# Patient Record
Sex: Female | Born: 1945 | Race: White | Hispanic: No | Smoking: Former smoker
Health system: Southern US, Community
[De-identification: ages and names within clinical notes are randomized; demographics above are authoritative.]

## PROBLEM LIST (undated history)

## (undated) DIAGNOSIS — IMO0001 Reserved for inherently not codable concepts without codable children: Secondary | ICD-10-CM

## (undated) DIAGNOSIS — IMO0002 Reserved for concepts with insufficient information to code with codable children: Secondary | ICD-10-CM

## (undated) DIAGNOSIS — E669 Obesity, unspecified: Secondary | ICD-10-CM

## (undated) DIAGNOSIS — C7931 Secondary malignant neoplasm of brain: Secondary | ICD-10-CM

## (undated) DIAGNOSIS — F329 Major depressive disorder, single episode, unspecified: Secondary | ICD-10-CM

## (undated) DIAGNOSIS — I10 Essential (primary) hypertension: Secondary | ICD-10-CM

## (undated) DIAGNOSIS — F32A Depression, unspecified: Secondary | ICD-10-CM

## (undated) DIAGNOSIS — J449 Chronic obstructive pulmonary disease, unspecified: Secondary | ICD-10-CM

## (undated) DIAGNOSIS — E785 Hyperlipidemia, unspecified: Secondary | ICD-10-CM

## (undated) HISTORY — PX: REPLACEMENT TOTAL KNEE: SUR1224

---

## 1999-11-19 ENCOUNTER — Ambulatory Visit (HOSPITAL_COMMUNITY): Admission: RE | Admit: 1999-11-19 | Discharge: 1999-11-19 | Payer: Self-pay | Admitting: Gastroenterology

## 2000-05-08 ENCOUNTER — Encounter: Payer: Self-pay | Admitting: Family Medicine

## 2000-05-08 ENCOUNTER — Encounter: Admission: RE | Admit: 2000-05-08 | Discharge: 2000-05-08 | Payer: Self-pay | Admitting: Family Medicine

## 2000-07-13 ENCOUNTER — Encounter: Admission: RE | Admit: 2000-07-13 | Discharge: 2000-07-21 | Payer: Self-pay | Admitting: Orthopedic Surgery

## 2001-04-21 ENCOUNTER — Encounter: Payer: Self-pay | Admitting: Emergency Medicine

## 2001-04-21 ENCOUNTER — Emergency Department (HOSPITAL_COMMUNITY): Admission: EM | Admit: 2001-04-21 | Discharge: 2001-04-21 | Payer: Self-pay | Admitting: Emergency Medicine

## 2001-06-18 ENCOUNTER — Encounter: Admission: RE | Admit: 2001-06-18 | Discharge: 2001-07-05 | Payer: Self-pay | Admitting: Orthopedic Surgery

## 2005-07-04 ENCOUNTER — Inpatient Hospital Stay (HOSPITAL_COMMUNITY): Admission: RE | Admit: 2005-07-04 | Discharge: 2005-07-07 | Payer: Self-pay | Admitting: Orthopedic Surgery

## 2005-07-04 ENCOUNTER — Ambulatory Visit: Payer: Self-pay | Admitting: Physical Medicine & Rehabilitation

## 2005-07-07 ENCOUNTER — Encounter (HOSPITAL_COMMUNITY)
Admission: RE | Admit: 2005-07-07 | Discharge: 2005-07-08 | Payer: Self-pay | Admitting: Physical Medicine & Rehabilitation

## 2005-07-07 ENCOUNTER — Inpatient Hospital Stay
Admission: RE | Admit: 2005-07-07 | Discharge: 2005-07-12 | Payer: Self-pay | Admitting: Physical Medicine & Rehabilitation

## 2007-02-21 ENCOUNTER — Ambulatory Visit (HOSPITAL_COMMUNITY): Admission: RE | Admit: 2007-02-21 | Discharge: 2007-02-21 | Payer: Self-pay | Admitting: Gastroenterology

## 2007-02-21 ENCOUNTER — Encounter (INDEPENDENT_AMBULATORY_CARE_PROVIDER_SITE_OTHER): Payer: Self-pay | Admitting: Gastroenterology

## 2007-04-20 ENCOUNTER — Ambulatory Visit (HOSPITAL_BASED_OUTPATIENT_CLINIC_OR_DEPARTMENT_OTHER): Admission: RE | Admit: 2007-04-20 | Discharge: 2007-04-20 | Payer: Self-pay | Admitting: Family

## 2007-04-29 ENCOUNTER — Ambulatory Visit: Payer: Self-pay | Admitting: Internal Medicine

## 2007-06-15 ENCOUNTER — Ambulatory Visit (HOSPITAL_BASED_OUTPATIENT_CLINIC_OR_DEPARTMENT_OTHER): Admission: RE | Admit: 2007-06-15 | Discharge: 2007-06-15 | Payer: Self-pay | Admitting: Family Medicine

## 2007-06-21 ENCOUNTER — Ambulatory Visit: Payer: Self-pay | Admitting: Internal Medicine

## 2011-01-25 NOTE — Procedures (Signed)
NAMEJASSICA, Caitlin Carter              ACCOUNT NO.:  [id number]   MEDICAL RECORD NO.:  [id number]          PATIENT TYPE:  OUT   LOCATION:  SLEEP CENTER                 FACILITY:  [facility]   PHYSICIAN:  Clinton D. Maple Hudson, MD, FCCP, FACPDATE OF BIRTH:  06-12-1946   DATE OF STUDY:  04/20/2007                            NOCTURNAL POLYSOMNOGRAM   REFERRING PHYSICIAN:  Rema Fendt, M.D.   REFERRING PHYSICIAN:  Rema Fendt, M.D.   INDICATION FOR STUDY:  Hypersomnia with sleep apnea.   EPWORTH SLEEPINESS SCORE:  5/24.   BMI 43. Weight 260 pounds.   MEDICATIONS:  Home medications are listed and reviewed.   SLEEP ARCHITECTURE:  Total sleep time 201 minutes with sleep efficiency  54%. Stage I was 33%. Stage II 64%. Stage III absent. REM 3% of total  sleep time. Sleep latency 51 minutes. REM latency 110 minutes. Awake  after sleep onset 115 minutes. Arousal index 29.6. Temazepam and  alprazolam were taken at bedtime.   RESPIRATORY DATA:  Apnea/hypopnea index (AHI, RDI) 32.5 obstructive  events per hour indicating moderate obstructive sleep apnea/hypopnea  syndrome. There were 4 obstructive apneas and 105 hypopneas. Events were  not positional. REM AHI 76.4. There were not enough early events to  permit CPAP titration by sleep protocol on this study night.   OXYGEN DATA:  Very loud snoring with oxygen desaturation to a nadir of  74%. Mean oxygen saturation was 88% through the study night on room air,  indicating underlying cardiopulmonary disease.   CARDIAC DATA:  Sinus rhythm.   MOVEMENT-PARASOMNIA:  Occasional limb jerk with little effect on sleep.   IMPRESSIONS-RECOMMENDATIONS:  1. Short total sleep time with reduced REM. She had taken both      temazepam and alprazolam at bedtime.  2. Moderate obstructive sleep apnea/hypopnea syndrome, apnea/hypopnea      index 32.5 per hour. These were mainly hypopneas, nonpositional.      Snoring was very loud.  3. Events developed too late in  the night to permit initiation of CPAP      titration by split-night protocol on this study night. Consider      return for CPAP titration or evaluate for alternative therapy as      appropriate.  4. There was sustained hypoxemia with mean oxygen saturations through      the night of 88% which did not quite      trigger initiation of supplemental oxygen by protocol. If no other      therapy is chosen, consider supplemental oxygen or sleep. This      likely reflects underlying cardiopulmonary disease.      Clinton D. Maple Hudson, MD, [facility], FACP  Diplomate, Biomedical engineer of Sleep Medicine  Electronically Signed     CDY/MEDQ  D:  04/29/2007 11:36:41  T:  04/30/2007 08:32:34  Job:  [id number]

## 2011-01-25 NOTE — Op Note (Signed)
Caitlin Carter, DENZLER              ACCOUNT NO.:  [id number]   MEDICAL RECORD NO.:  [id number]          PATIENT TYPE:  AMB   LOCATION:  ENDO                         FACILITY:  [facility]   PHYSICIAN:  Anselmo Rod, M.D.  DATE OF BIRTH:  14-Jun-1946   DATE OF PROCEDURE:  02/21/2007  DATE OF DISCHARGE:                               OPERATIVE REPORT   PROCEDURE PERFORMED:  Colonoscopy with snare polypectomy x1 and cold  biopsies x6.   ENDOSCOPIST:  Anselmo Rod, M.D.   INSTRUMENT USED:  Pentax video colonoscope.   INDICATIONS FOR PROCEDURE:  65 year old white female undergoing  screening colonoscopy.  The patient has a history of rectal bleeding and  a long standing history of constipation, rule out colonic polyps,  masses, etc.   PREPROCEDURE PREPARATION:  Informed consent was procured from the  patient. The patient fasted for 8 hours prior to the procedure and  prepped with a gallon of NuLytely the night prior to the procedure. The  risks and benefits of the procedure including a 10% miss rate of cancer  and polyp were discussed with the patient, as well.   PREPROCEDURE PHYSICAL:  The patient had stable vital signs.  Neck was  supple.  Chest clear to auscultation.  S1 and S2 regular.  Abdomen soft  with normal bowel sounds.   DESCRIPTION OF PROCEDURE:  The patient was placed in the left lateral  decubitus position and sedated with 100 mcg of Fentanyl and 7.5 mg of  Versed given intravenously in slow incremental doses.  Once the patient  was adequately sedated and maintained on low flow oxygen and continuous  cardiac monitoring, the Pentax video colonoscope was advanced from the  rectum to the cecum.  Small internal hemorrhoids were seen on  retroflexion.  Sigmoid diverticula were noted.  There was a large amount  of residual stool in the colon especially in the diverticula.  A small  flat polyp was removed by cold snare from 30 cm, three small sessile  polyps were biopsied  between 20-30 cm.  Patchy erythema over the  ileocecal valve was biopsied, as well.  The terminal ileum was not  visualized in spite of efforts to do so.  The patient seemed to drop her  oxygen saturation on several occasions with sedation which indicates  that she may be actually suffering from sleep apnea. A small external  hemorrhoid was seen on withdrawal of the scope. The patient tolerated  the procedure well without immediate complications.   IMPRESSION:  1. Small external hemorrhoid.  2. Small internal hemorrhoids.  3. Sigmoid diverticulosis with a large amount of stool in the      diverticula.  4. Poor prep, large amount of residual stool in the colon.  5. Flat polyp removed by cold snare from 30 cm and three small sessile      polyps biopsied from 20-30 cm.  6. Patchy erythema or IC valve, biopsies done x 4.  7. Terminal ileum not visualized.  8. Question sleep apnea.  The patient had drop in oxygen saturation      during the procedure.  RECOMMENDATIONS:  1. A message has been left for Rema Fendt, P.A., at [facility] to make sure she is aware of this observation and      that the patient can be evaluated for sleep apnea in the near      future.  The patient and her family has been informed about this,      as well.  2. Await pathology results.  3. Avoid NSAIDS for the next 2 weeks.  4. OP follow up in 2 weeks or earlier if needed.  5. High fiber diet. Brochures on diverticulosis have been given to the      patient.      Anselmo Rod, M.D.  Electronically Signed     JNM/MEDQ  D:  02/21/2007  T:  02/21/2007  Job:  [id number]   cc:   Marjory Lies, M.D.  Fax: [phone]

## 2011-01-25 NOTE — Procedures (Signed)
NAMECLOYCE, Caitlin Carter              ACCOUNT NO.:  [id number]   MEDICAL RECORD NO.:  [id number]          PATIENT TYPE:  OUT   LOCATION:  SLEEP CENTER                 [facility]ACILITY:  [facility]   PHYSICIAN:  Clinton D. Maple Hudson, MD, FCCP, FACPDATE OF BIRTH:  1945/09[birth year]   DATE OF STUDY:  06/15/2007                            NOCTURNAL POLYSOMNOGRAM   REFERRING PHYSICIAN:  Evelena Peat, M.D.   INDICATION FOR STUDY:  Hypersomnia with sleep apnea.   EPWORTH SLEEPINESS SCORE:  5/24.  BMI 43.2, weight 260 pounds.   MEDICATIONS:  Home medications are listed and reviewed.   SLEEP ARCHITECTURE:  Total sleep time 349 minutes with sleep efficiency  94%.  Stage I is 5%, stage II 59%, stage III absent, REM 35% of total  sleep time. Sleep latency 3 minutes, REM latency 73 minutes, awake after  sleep onset 19 minutes, arousal index 5.8.  Temazepam 15 mg and  alprazolam were both listed by the technician.  His medications the  patient sometimes takes, not clear whether they were taken for this  study.   RESPIRATORY DATA:  CPAP titration protocol. CPAP was titrated to 13 CWP,  AHI 0 per hour.  A medium Mirage Quattro mask was used with heated  humidifier.   OXYGEN DATA:  Snoring was prevented by CPAP and oxygen saturation held  at 90-93% on room air with CPAP control.   CARDIAC DATA:  Normal sinus rhythm.   MOVEMENT-PARASOMNIA:  No movement disturbance.  Bathroom 1.   IMPRESSIONS-RECOMMENDATIONS:  1. Successful CPAP titration to 12 CWP, AHI 0 per hour.  A medium      Mirage Quattro mask was used with heated humidifier.  2. Baseline diagnostic NPSG on April 20, 2007 had recorded an AHI of      32.5 per hour.      Clinton D. Maple Hudson, MD, [facility], FACP  Diplomate, Biomedical engineer of Sleep Medicine  Electronically Signed    CDY/MEDQ  D:  06/23/2007 14:02:37  T:  06/23/2007 21:30:02  Job:  [id number]

## 2011-01-28 NOTE — Discharge Summary (Signed)
NAMEJOJO, GEVING              ACCOUNT NO.:  [id number]   MEDICAL RECORD NO.:  [id number]          PATIENT TYPE:  ORB   LOCATION:  4504                         FACILITY:  [facility]   PHYSICIAN:  Ranelle Oyster, M.D.DATE OF BIRTH:  September 19, 1945   DATE OF ADMISSION:  07/07/2005  DATE OF DISCHARGE:  07/12/2005                                 DISCHARGE SUMMARY   DISCHARGE DIAGNOSES:  1.  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  2.  Pain management.  3.  Coumadin for deep vein thrombosis prophylaxis.  4.  Hypertension.  5.  Streptococcus B urinary tract infection.  6.  Insomnia.  7.  Tobacco abuse.  8.  Hyperlipidemia.   HISTORY OF PRESENT ILLNESS:  This is a 65 year old female admitted July 04, 2005 with end-stage changes of the right knee and no relief with  conservative care.  Underwent a right total knee replacement July 04, 2005 per Dr. Thurston Hole.  Placed on Coumadin for deep vein thrombosis  prophylaxis.  Weightbearing as tolerated.  PCA morphine discontinued July 05, 2005.  Placed on OxyContin sustained release.  Foley catheter tube  removed July 05, 2005.  Placed on Augmentin July 05, 2005 for  preoperative urinary tract infection of Streptococcus B.  She was admitted  to subacute care services.   PAST MEDICAL HISTORY:  See discharge diagnoses.  Noted positive tobacco, no  alcohol.   ALLERGIES:  NO BLOOD PRODUCTS.   SOCIAL HISTORY:  Lives alone.  She is a Scientist, product/process development.  Works for the  [employer] of [employer], site.  Local family works.   MEDICATIONS PRIOR TO ADMISSION:  1.  Diovan/HCT 160/25 daily.  2.  Restoril 15 mg at bedtime.  3.  Xanax 0.5 mg at bedtime.  4.  Pravachol 80 mg daily.   HOSPITAL COURSE:  Patient with progressive gains while on rehab services,  with therapies initiated daily.  The following issues were followed during  the patient's rehab course.  Pertaining to Mrs. Eicher's  right total knee  replacement, surgical site healing nicely.  She did have some mild redness.  She had been placed on Keflex for wound coverage.  Initially, the patient  had been on Augmentin for a preoperative urinary tract infection of  Streptococcus B.  Pain management ongoing with the use of OxyContin  sustained release that was slowly tapered as well as oxycodone for  breakthrough pain.  Coumadin for deep vein thrombosis prophylaxis, with  latest INR of 1.6.  She remained on subcutaneous Lovenox until INR greater  than 2.  Blood pressures monitored with the use of Avapro and  hydrochlorothiazide.  She remained on her Restoril for history of insomnia  as well as Xanax for mild anxiety.  Pravachol for hyperlipidemia, which was  without issue during her rehab stay.  She had no bowel or bladder  disturbances.  She did spike a low-grade temperature.  Urinalysis study was  insignificant growth.  Chest was clear to auscultation.  She denied any  chest pain or shortness of breath.  Her Keflex had been added for wound  coverage.  Fevers had later resolved.   Overall, for her functional mobility, she was ambulating household  functional distances with a walker, essentially independent to standby  assist in all areas of activities of daily living, except minimal assist for  lower body dressing.  Home health therapies had been arranged.   Latest labs showed an INR of 1.6, hemoglobin 10, hematocrit 28.9.  Sodium  137, potassium 3.9, BUN 10, creatinine 1.   DISCHARGE MEDICATIONS AT TIME OF DICTATION:  1.  Coumadin, with latest dose of 6 mg, to be discontinued August 04, 2005.  2.  Zanaflex 2 mg at bedtime as needed.  3.  Pravachol 80 mg daily.  4.  OxyContin sustained release 10 mg every 12 hours x1 week, then      discontinued.  5.  Diovan with hydrochlorothiazide 160/25 mg daily.  6.  Xanax 0.5 mg at bedtime.  7.  Restoril 15 mg at bedtime.  8.  Keflex 500 mg q.i.d. x3 days, then  discontinue.  9.  Oxycodone as needed for breakthrough pain.   She would return to Dr. Thurston Hole for orthopedic services, call for  appointment.  She was weightbearing as tolerated.  A home health nurse had  been arranged per [facility] to check next INR on Wednesday July 13, 2005.      Mariam Dollar, P.A.      Ranelle Oyster, M.D.  Electronically Signed    DA/MEDQ  D:  07/11/2005  T:  07/11/2005  Job:  [id number]   cc:   Ranelle Oyster, M.D.  Fax: [phone]   Elana Alm. Thurston Hole, M.D.  Fax: [phone]   Rema Fendt, M.D.  [facility]  [place]

## 2011-01-28 NOTE — Discharge Summary (Signed)
Caitlin Carter, Caitlin Carter              ACCOUNT NO.:  [id number]   MEDICAL RECORD NO.:  [id number]          PATIENT TYPE:  INP   LOCATION:  5023                         FACILITY:  [facility]   PHYSICIAN:  Molly Maduro A. Thurston Hole, M.D. DATE OF BIRTH:  [birth year]   DATE OF ADMISSION:  07/04/2005  DATE OF DISCHARGE:  07/07/2005                                 DISCHARGE SUMMARY   ADMITTING DIAGNOSES:  1.  End stage degenerative joint disease, right knee.  2.  Hypertension.   DISCHARGE DIAGNOSES:  1.  End stage degenerative joint disease, right knee.  2.  Hypertension.  3.  Urinary tract infection.  4.  Tobacco disorder.   HISTORY OF PRESENT ILLNESS:  The patient is a 65 year old white female with  a history of end stage DJD of both knees, her right is more painful than her  left. She has tried conservative care including intra-articular cortisone  injections and anti-inflammatories without success. She understands the  risks, benefits, and possible complications of a right total knee  replacement and is without question.   PROCEDURE IN HOUSE:  On July 04, 2005 the patient underwent a right total  knee replacement by Dr. Thurston Hole and a right femoral nerve block anesthesia.  She tolerated both procedures well and was admitted postoperatively for pain  control, DVT prophylaxis, and physical therapy. Postop day 1 the patient was  doing well, wanted to get out of bed. Her hemoglobin was 12.2, her INR was  1.1. She had a urine culture that was positive for greater than 100,000  colonies of group B strep. She is on Ancef IV for this. She was started on  Percocet. Her PCA was discontinued. Postop day 2 the patient was doing well.  Difficulty with pain control. T-max of 100.5, T now at 99.2. INR 2.3. Her  Foley was discontinued. She was started on OxyContin 10 mg b.i.d. Rehab  consult was ordered due to the fact that the patient lived alone. Postop day  3 the patient was doing much better for pain control.  Her hemoglobin was 10,  her INR was 1.5, white cell count was 9.4. Surgical wound was well  approximated. She was discharged to subacute in stable condition,  weightbearing as tolerated, on a regular diet, on Percocet 1 to 2 q.4-6h.  p.r.n. pain, OxyContin CR 10 mg 1 p.o. b.i.d., Colace 100 mg 1 p.o. b.i.d.,  Senokot-S 2 before dinner, Diovan  HCT 160/25 daily, doxycycline 100 mg  b.i.d., temazepam 15 mg at bedtime, Xanax 0.5 mg at bedtime, Zanaflex 2 mg  at bedtime, Pravachol 80 mg daily. We will continue to follow her on  subacute.     Kirstin Shepperson, P.A.      Robert A. Thurston Hole, M.D.  Electronically Signed   KS/MEDQ  D:  09/18/2005  T:  09/19/2005  Job:  [id number]

## 2011-01-28 NOTE — Op Note (Signed)
Caitlin Carter, Caitlin Carter              ACCOUNT NO.:  [id number]   MEDICAL RECORD NO.:  [id number]          PATIENT TYPE:  INP   LOCATION:  2899                         FACILITY:  [facility]   PHYSICIAN:  Elana Alm. Thurston Hole, M.D. DATE OF BIRTH:  [birth year]   DATE OF PROCEDURE:  07/04/2005  DATE OF DISCHARGE:                                 OPERATIVE REPORT   PREOPERATIVE DIAGNOSIS:  Right knee degenerative joint disease.   POSTOPERATIVE DIAGNOSIS:  Right knee degenerative joint disease.   PROCEDURE:  Right total knee replacement using DePuy cemented total knee  system with #4 cemented femur, #4 cemented tibia with 12.5-mm polyethylene  RP tibial spacer, and 32-mm polyethylene cemented patella.   SURGEON:  Elana Alm. Thurston Hole, M.D.   ASSISTANT:  Julien Girt, P.A.   ANESTHESIA:  General.   OPERATIVE TIME:  One hour 40 minutes.   COMPLICATIONS:  None.   DESCRIPTION OF PROCEDURE:  Caitlin Carter was brought to operating room on  July 04, 2005 after a femoral nerve block had been placed in the holding  room by Anesthesia.  She was placed on the operative table in a supine  position.  She received Ancef 1 gram IV preoperatively for prophylaxis.  After being placed under general anesthesia, she had a Foley catheter placed  under sterile conditions.  Her right knee was examined, range of motion from  -5 to 125 degrees with mild varus deformity, knee stable to ligamentous  exam.  Her right leg was then prepped using sterile DuraPrep and draped  using sterile technique.  The leg was exsanguinated and a thigh tourniquet  elevated to .  Initially, through a 15-cm longitudinal incision based  over the patella, initial exposure was made.  The underlying subcutaneous  tissues were incised in line with the skin incision.  A median arthrotomy  was performed revealing an excessive amount of normal-appearing joint fluid.  The articular surfaces were inspected.  She had grade 4 changes medially,  grade 3 and 4 changes laterally, and grade 3-4 changes in the patellofemoral  joint.  Osteophytes were removed from the femoral condyles and tibial  plateau.  The medial and lateral meniscal remnants were removed as well as  anterior cruciate ligament.  At this point then an intramedullary drill was  drilled up the femoral canal for placement of the distal femoral cutting  jig, which was placed in the appropriate amount of rotation and a distal 11-  mm cut was made.  Distal femur was then sized.  A #4 was found to be the  appropriate size and a #4 cutting jig was placed and then these cuts were  made.  Proximal tibia was then exposed.  The tibial spines were removed with  an oscillating saw.  Intramedullary drill was drilled down the tibial canal  for placement of the proximal tibial cutting jig, which was placed in the  appropriate amount of rotation and a proximal 6-mm cut was made based off  the medial or lower side.  At this point then spacer blocks were placed in  flexion and extension and 12.5-mm blocks gave excellent  balancing and  excellent stability and excellent correction of her flexion and varus  deformities.  At this point, a #4 tibial base plate trial was placed and  then the keel cut was made.  After this was done then the PCL box cutter was  placed on the distal femur and these cuts were made.  At this point, the #4  femoral trial was placed.  The #4 tibial base plate trial was placed and  with the 12.5-mm polyethylene tibial spacer, the knee was reduced, taken  through range of motion of 0 to 125 degrees with excellent stability and  excellent correction of her flexion and varus deformities.  The patella was  then sized.  A 9-mm resurfacing cut was made and 3 locking holes were placed  for a polyethylene patellar trial, which was placed.  Patellofemoral  tracking was then evaluated and this was found to be normal.  At this point,  it was felt that all trial components  were of excellent size, fit and  stability.  They were then removed.  The knee was then jet-lavage irrigated  with 3 L of saline and then the proximal tibia was exposed and the #4 tibial  baseplate with cement backing was hammered into position with an excellent  fit with excess cement being removed from around the edges and the #4  femoral component with cement backing was hammered in position, also with an  excellent fit, with excess cement being removed from around the edges.  The  12.5-mm polyethylene RP tibial spacer was then placed on tibial baseplate,  the knee reduced, taken through range of motion from 0-125 degrees with  excellent stability and excellent correction of her flexion and varus  deformities.  The 32-mm polyethylene cement-backed patella was then placed  in it position and held there with a clamp.  After the cement hardened,  patellofemoral tracking was again evaluated and this was found to be normal.  At this point, it was felt that all components were of excellent size, fit  and stability.  The wound was further irrigated with saline and then the  tourniquet was released.  Hemostasis was obtained with cautery.  The  arthrotomy was then closed with #1 Ethibond suture over 2 medium Hemovac  drains.  Subcutaneous tissues were closed with 0 and 2-0 Vicryl,  subcuticular layer closed with 4-0 Monocryl, Steri-Strips were applied,  sterile dressings were applied, the Hemovac injected with 0.2% Marcaine with  epinephrine and 4 mg of morphine and then the patient awakened, extubated,  and taken to the recovery room in stable condition.  Needle and sponge  counts were correct x2 at the end of the case.      Robert A. Thurston Hole, M.D.  Electronically Signed     RAW/MEDQ  D:  07/04/2005  T:  07/05/2005  Job:  [id number]

## 2014-10-15 ENCOUNTER — Telehealth: Payer: Self-pay | Admitting: Internal Medicine

## 2014-10-15 NOTE — Telephone Encounter (Signed)
error 

## 2014-10-23 ENCOUNTER — Encounter (HOSPITAL_COMMUNITY): Payer: Self-pay | Admitting: Internal Medicine

## 2014-10-23 ENCOUNTER — Inpatient Hospital Stay (HOSPITAL_COMMUNITY): Payer: Medicare Other

## 2014-10-23 ENCOUNTER — Emergency Department (HOSPITAL_COMMUNITY): Payer: Medicare Other

## 2014-10-23 ENCOUNTER — Inpatient Hospital Stay (HOSPITAL_COMMUNITY)
Admission: EM | Admit: 2014-10-23 | Discharge: 2014-10-30 | DRG: 054 | Disposition: A | Discharge status: Skilled Nursing Facility | Payer: Medicare Other | Attending: Internal Medicine | Admitting: Internal Medicine

## 2014-10-23 DIAGNOSIS — K760 Fatty (change of) liver, not elsewhere classified: Secondary | ICD-10-CM | POA: Diagnosis present

## 2014-10-23 DIAGNOSIS — I248 Other forms of acute ischemic heart disease: Secondary | ICD-10-CM | POA: Diagnosis present

## 2014-10-23 DIAGNOSIS — E785 Hyperlipidemia, unspecified: Secondary | ICD-10-CM | POA: Diagnosis present

## 2014-10-23 DIAGNOSIS — R778 Other specified abnormalities of plasma proteins: Secondary | ICD-10-CM

## 2014-10-23 DIAGNOSIS — E669 Obesity, unspecified: Secondary | ICD-10-CM | POA: Diagnosis present

## 2014-10-23 DIAGNOSIS — J9601 Acute respiratory failure with hypoxia: Secondary | ICD-10-CM | POA: Diagnosis present

## 2014-10-23 DIAGNOSIS — G4700 Insomnia, unspecified: Secondary | ICD-10-CM | POA: Diagnosis present

## 2014-10-23 DIAGNOSIS — J441 Chronic obstructive pulmonary disease with (acute) exacerbation: Secondary | ICD-10-CM | POA: Diagnosis present

## 2014-10-23 DIAGNOSIS — Z7982 Long term (current) use of aspirin: Secondary | ICD-10-CM | POA: Diagnosis not present

## 2014-10-23 DIAGNOSIS — R2990 Unspecified symptoms and signs involving the nervous system: Secondary | ICD-10-CM | POA: Diagnosis present

## 2014-10-23 DIAGNOSIS — F17210 Nicotine dependence, cigarettes, uncomplicated: Secondary | ICD-10-CM | POA: Diagnosis present

## 2014-10-23 DIAGNOSIS — R229 Localized swelling, mass and lump, unspecified: Secondary | ICD-10-CM | POA: Diagnosis present

## 2014-10-23 DIAGNOSIS — Z801 Family history of malignant neoplasm of trachea, bronchus and lung: Secondary | ICD-10-CM | POA: Diagnosis not present

## 2014-10-23 DIAGNOSIS — F329 Major depressive disorder, single episode, unspecified: Secondary | ICD-10-CM | POA: Diagnosis present

## 2014-10-23 DIAGNOSIS — J449 Chronic obstructive pulmonary disease, unspecified: Secondary | ICD-10-CM | POA: Diagnosis present

## 2014-10-23 DIAGNOSIS — Z6841 Body Mass Index (BMI) 40.0 and over, adult: Secondary | ICD-10-CM | POA: Diagnosis not present

## 2014-10-23 DIAGNOSIS — I2510 Atherosclerotic heart disease of native coronary artery without angina pectoris: Secondary | ICD-10-CM | POA: Diagnosis present

## 2014-10-23 DIAGNOSIS — C7931 Secondary malignant neoplasm of brain: Principal | ICD-10-CM | POA: Diagnosis present

## 2014-10-23 DIAGNOSIS — G936 Cerebral edema: Secondary | ICD-10-CM | POA: Diagnosis present

## 2014-10-23 DIAGNOSIS — I5033 Acute on chronic diastolic (congestive) heart failure: Secondary | ICD-10-CM | POA: Diagnosis present

## 2014-10-23 DIAGNOSIS — I10 Essential (primary) hypertension: Secondary | ICD-10-CM | POA: Diagnosis present

## 2014-10-23 DIAGNOSIS — I5032 Chronic diastolic (congestive) heart failure: Secondary | ICD-10-CM | POA: Diagnosis present

## 2014-10-23 DIAGNOSIS — C801 Malignant (primary) neoplasm, unspecified: Secondary | ICD-10-CM

## 2014-10-23 DIAGNOSIS — Z803 Family history of malignant neoplasm of breast: Secondary | ICD-10-CM | POA: Diagnosis not present

## 2014-10-23 DIAGNOSIS — K5730 Diverticulosis of large intestine without perforation or abscess without bleeding: Secondary | ICD-10-CM | POA: Diagnosis present

## 2014-10-23 DIAGNOSIS — R29898 Other symptoms and signs involving the musculoskeletal system: Secondary | ICD-10-CM

## 2014-10-23 DIAGNOSIS — E876 Hypokalemia: Secondary | ICD-10-CM | POA: Diagnosis present

## 2014-10-23 DIAGNOSIS — D751 Secondary polycythemia: Secondary | ICD-10-CM | POA: Diagnosis present

## 2014-10-23 DIAGNOSIS — R9089 Other abnormal findings on diagnostic imaging of central nervous system: Secondary | ICD-10-CM | POA: Diagnosis present

## 2014-10-23 DIAGNOSIS — Z9989 Dependence on other enabling machines and devices: Secondary | ICD-10-CM

## 2014-10-23 DIAGNOSIS — D496 Neoplasm of unspecified behavior of brain: Secondary | ICD-10-CM | POA: Diagnosis present

## 2014-10-23 DIAGNOSIS — Z720 Tobacco use: Secondary | ICD-10-CM | POA: Diagnosis present

## 2014-10-23 DIAGNOSIS — D499 Neoplasm of unspecified behavior of unspecified site: Secondary | ICD-10-CM | POA: Diagnosis present

## 2014-10-23 DIAGNOSIS — G4733 Obstructive sleep apnea (adult) (pediatric): Secondary | ICD-10-CM | POA: Diagnosis present

## 2014-10-23 DIAGNOSIS — R9389 Abnormal findings on diagnostic imaging of other specified body structures: Secondary | ICD-10-CM | POA: Diagnosis present

## 2014-10-23 DIAGNOSIS — R569 Unspecified convulsions: Secondary | ICD-10-CM

## 2014-10-23 DIAGNOSIS — R7989 Other specified abnormal findings of blood chemistry: Secondary | ICD-10-CM | POA: Diagnosis present

## 2014-10-23 DIAGNOSIS — R0602 Shortness of breath: Secondary | ICD-10-CM | POA: Diagnosis present

## 2014-10-23 DIAGNOSIS — IMO0002 Reserved for concepts with insufficient information to code with codable children: Secondary | ICD-10-CM

## 2014-10-23 HISTORY — DX: Essential (primary) hypertension: I10

## 2014-10-23 HISTORY — DX: Obesity, unspecified: E66.9

## 2014-10-23 HISTORY — DX: Depression, unspecified: F32.A

## 2014-10-23 HISTORY — DX: Major depressive disorder, single episode, unspecified: F32.9

## 2014-10-23 HISTORY — DX: Hyperlipidemia, unspecified: E78.5

## 2014-10-23 LAB — PROTIME-INR
INR: 0.99 (ref 0.00–1.49)
PROTHROMBIN TIME: 13.2 s (ref 11.6–15.2)

## 2014-10-23 LAB — RAPID URINE DRUG SCREEN, HOSP PERFORMED
AMPHETAMINES: NOT DETECTED
BENZODIAZEPINES: NOT DETECTED
Barbiturates: NOT DETECTED
Cocaine: NOT DETECTED
Opiates: NOT DETECTED
Tetrahydrocannabinol: NOT DETECTED

## 2014-10-23 LAB — I-STAT ARTERIAL BLOOD GAS, ED
Bicarbonate: 25.6 mEq/L — ABNORMAL HIGH (ref 20.0–24.0)
O2 SAT: 95 %
PCO2 ART: 45.9 mmHg — AB (ref 35.0–45.0)
Patient temperature: 98.6
TCO2: 27 mmol/L (ref 0–100)
pH, Arterial: 7.355 (ref 7.350–7.450)
pO2, Arterial: 80 mmHg (ref 80.0–100.0)

## 2014-10-23 LAB — I-STAT CHEM 8, ED
BUN: 16 mg/dL (ref 6–23)
CHLORIDE: 98 mmol/L (ref 96–112)
Calcium, Ion: 1.23 mmol/L (ref 1.13–1.30)
Creatinine, Ser: 0.7 mg/dL (ref 0.50–1.10)
Glucose, Bld: 113 mg/dL — ABNORMAL HIGH (ref 70–99)
HCT: 50 % — ABNORMAL HIGH (ref 36.0–46.0)
Hemoglobin: 17 g/dL — ABNORMAL HIGH (ref 12.0–15.0)
Potassium: 3.1 mmol/L — ABNORMAL LOW (ref 3.5–5.1)
SODIUM: 142 mmol/L (ref 135–145)
TCO2: 24 mmol/L (ref 0–100)

## 2014-10-23 LAB — COMPREHENSIVE METABOLIC PANEL
ALT: 55 U/L — ABNORMAL HIGH (ref 0–35)
ANION GAP: 12 (ref 5–15)
AST: 57 U/L — ABNORMAL HIGH (ref 0–37)
Albumin: 3.5 g/dL (ref 3.5–5.2)
Alkaline Phosphatase: 72 U/L (ref 39–117)
BUN: 14 mg/dL (ref 6–23)
CALCIUM: 9.3 mg/dL (ref 8.4–10.5)
CO2: 29 mmol/L (ref 19–32)
Chloride: 101 mmol/L (ref 96–112)
Creatinine, Ser: 0.78 mg/dL (ref 0.50–1.10)
GFR, EST NON AFRICAN AMERICAN: 84 mL/min — AB (ref >90)
GLUCOSE: 117 mg/dL — AB (ref 70–99)
Potassium: 3.5 mmol/L (ref 3.5–5.1)
Sodium: 142 mmol/L (ref 135–145)
TOTAL PROTEIN: 7 g/dL (ref 6.0–8.3)
Total Bilirubin: 0.5 mg/dL (ref 0.3–1.2)

## 2014-10-23 LAB — DIFFERENTIAL
BASOS ABS: 0 10*3/uL (ref 0.0–0.1)
BASOS PCT: 0 % (ref 0–1)
EOS ABS: 0 10*3/uL (ref 0.0–0.7)
Eosinophils Relative: 0 % (ref 0–5)
Lymphocytes Relative: 7 % — ABNORMAL LOW (ref 12–46)
Lymphs Abs: 0.9 10*3/uL (ref 0.7–4.0)
MONO ABS: 0.5 10*3/uL (ref 0.1–1.0)
MONOS PCT: 3 % (ref 3–12)
Neutro Abs: 12.1 10*3/uL — ABNORMAL HIGH (ref 1.7–7.7)
Neutrophils Relative %: 90 % — ABNORMAL HIGH (ref 43–77)

## 2014-10-23 LAB — I-STAT TROPONIN, ED: TROPONIN I, POC: 0.36 ng/mL — AB (ref 0.00–0.08)

## 2014-10-23 LAB — URINALYSIS, ROUTINE W REFLEX MICROSCOPIC
BILIRUBIN URINE: NEGATIVE
Glucose, UA: NEGATIVE mg/dL
Hgb urine dipstick: NEGATIVE
KETONES UR: NEGATIVE mg/dL
Leukocytes, UA: NEGATIVE
NITRITE: NEGATIVE
PH: 6 (ref 5.0–8.0)
Protein, ur: 30 mg/dL — AB
Specific Gravity, Urine: 1.015 (ref 1.005–1.030)
Urobilinogen, UA: 0.2 mg/dL (ref 0.0–1.0)

## 2014-10-23 LAB — ETHANOL: Alcohol, Ethyl (B): <5 mg/dL (ref 0–9)

## 2014-10-23 LAB — URINE MICROSCOPIC-ADD ON

## 2014-10-23 LAB — CBC
HEMATOCRIT: 45.4 % (ref 36.0–46.0)
Hemoglobin: 14.9 g/dL (ref 12.0–15.0)
MCH: 29.2 pg (ref 26.0–34.0)
MCHC: 32.8 g/dL (ref 30.0–36.0)
MCV: 88.8 fL (ref 78.0–100.0)
Platelets: 197 10*3/uL (ref 150–400)
RBC: 5.11 MIL/uL (ref 3.87–5.11)
RDW: 14.3 % (ref 11.5–15.5)
WBC: 13.5 10*3/uL — AB (ref 4.0–10.5)

## 2014-10-23 LAB — TROPONIN I
TROPONIN I: 0.96 ng/mL — AB (ref <0.031)
Troponin I: 0.49 ng/mL — ABNORMAL HIGH (ref <0.031)

## 2014-10-23 LAB — BRAIN NATRIURETIC PEPTIDE: B NATRIURETIC PEPTIDE 5: 95.2 pg/mL (ref 0.0–100.0)

## 2014-10-23 LAB — APTT: aPTT: 32 seconds (ref 24–37)

## 2014-10-23 MED ORDER — LEVETIRACETAM 500 MG PO TABS
500.0000 mg | ORAL_TABLET | Freq: Two times a day (BID) | ORAL | Status: DC
  Administered 2014-10-24 – 2014-10-30 (×13): 500 mg via ORAL
  Filled 2014-10-23 (×14): qty 1

## 2014-10-23 MED ORDER — SENNOSIDES-DOCUSATE SODIUM 8.6-50 MG PO TABS
1.0000 | ORAL_TABLET | Freq: Every evening | ORAL | Status: DC | PRN
  Filled 2014-10-23: qty 1

## 2014-10-23 MED ORDER — IPRATROPIUM-ALBUTEROL 0.5-2.5 (3) MG/3ML IN SOLN
3.0000 mL | RESPIRATORY_TRACT | Status: DC
  Administered 2014-10-23 – 2014-10-26 (×17): 3 mL via RESPIRATORY_TRACT
  Filled 2014-10-23 (×17): qty 3

## 2014-10-23 MED ORDER — LEVETIRACETAM IN NACL 500 MG/100ML IV SOLN
500.0000 mg | Freq: Once | INTRAVENOUS | Status: AC
  Administered 2014-10-23: 500 mg via INTRAVENOUS
  Filled 2014-10-23: qty 100

## 2014-10-23 MED ORDER — ASPIRIN 81 MG PO TABS: 81.0000 mg | ORAL_TABLET | Freq: Every day | ORAL | Status: DC

## 2014-10-23 MED ORDER — STROKE: EARLY STAGES OF RECOVERY BOOK
Freq: Once | Status: AC
  Administered 2014-10-23: 22:00:00
  Filled 2014-10-23: qty 1

## 2014-10-23 MED ORDER — SIMVASTATIN 40 MG PO TABS
40.0000 mg | ORAL_TABLET | Freq: Every day | ORAL | Status: DC
  Administered 2014-10-24 – 2014-10-28 (×5): 40 mg via ORAL
  Filled 2014-10-23 (×7): qty 1

## 2014-10-23 MED ORDER — PAROXETINE HCL 20 MG PO TABS
20.0000 mg | ORAL_TABLET | Freq: Every day | ORAL | Status: DC
  Administered 2014-10-23 – 2014-10-30 (×8): 20 mg via ORAL
  Filled 2014-10-23 (×8): qty 1

## 2014-10-23 MED ORDER — METHYLPREDNISOLONE SODIUM SUCC 125 MG IJ SOLR
60.0000 mg | Freq: Four times a day (QID) | INTRAMUSCULAR | Status: DC
  Administered 2014-10-23 – 2014-10-24 (×4): 60 mg via INTRAVENOUS
  Filled 2014-10-23 (×3): qty 0.96
  Filled 2014-10-23 (×2): qty 2
  Filled 2014-10-23: qty 0.96

## 2014-10-23 MED ORDER — IPRATROPIUM-ALBUTEROL 0.5-2.5 (3) MG/3ML IN SOLN
3.0000 mL | Freq: Once | RESPIRATORY_TRACT | Status: AC
  Administered 2014-10-23: 3 mL via RESPIRATORY_TRACT
  Filled 2014-10-23: qty 3

## 2014-10-23 MED ORDER — ASPIRIN EC 81 MG PO TBEC
81.0000 mg | DELAYED_RELEASE_TABLET | Freq: Every day | ORAL | Status: DC
  Administered 2014-10-24 – 2014-10-30 (×7): 81 mg via ORAL
  Filled 2014-10-23 (×7): qty 1

## 2014-10-23 MED ORDER — ALBUTEROL SULFATE (2.5 MG/3ML) 0.083% IN NEBU: 2.5000 mg | INHALATION_SOLUTION | RESPIRATORY_TRACT | Status: DC | PRN

## 2014-10-23 MED ORDER — CALCIUM CARBONATE-VITAMIN D 500-200 MG-UNIT PO TABS
1.0000 | ORAL_TABLET | Freq: Every day | ORAL | Status: DC
  Administered 2014-10-24 – 2014-10-30 (×7): 1 via ORAL
  Filled 2014-10-23 (×8): qty 1

## 2014-10-23 MED ORDER — IOHEXOL 350 MG/ML SOLN
100.0000 mL | Freq: Once | INTRAVENOUS | Status: AC | PRN
  Administered 2014-10-23: 100 mL via INTRAVENOUS

## 2014-10-23 MED ORDER — ENOXAPARIN SODIUM 40 MG/0.4ML ~~LOC~~ SOLN
40.0000 mg | SUBCUTANEOUS | Status: DC
  Administered 2014-10-23 – 2014-10-25 (×3): 40 mg via SUBCUTANEOUS
  Filled 2014-10-23 (×4): qty 0.4

## 2014-10-23 MED ORDER — FUROSEMIDE 10 MG/ML IJ SOLN
40.0000 mg | Freq: Once | INTRAMUSCULAR | Status: AC
  Administered 2014-10-23: 40 mg via INTRAVENOUS
  Filled 2014-10-23: qty 4

## 2014-10-23 MED ORDER — LEVOFLOXACIN IN D5W 750 MG/150ML IV SOLN
750.0000 mg | INTRAVENOUS | Status: DC
  Administered 2014-10-23 – 2014-10-25 (×3): 750 mg via INTRAVENOUS
  Filled 2014-10-23 (×4): qty 150

## 2014-10-23 NOTE — ED Notes (Signed)
CT notified of IV placement. 

## 2014-10-23 NOTE — H&P (Signed)
[facility] History and Physical  Caitlin Carter WIO:[id number] DOB: September 22, 1945 DOA: 10/23/2014  Referring physician: er PCP: Antony Blackbird, MD   Chief Complaint: arm weakness  HPI: Caitlin Carter is a 69 y.o. female  Who comes in c/o b/l arm weakness- left > right.  This same thing happened 2 weeks ago but resolved on its own.  She also has developed worsening SOB and cough.  No fever, no chills.  Denies weight loss  In the ER, she was hypoxic, requiring 4 L O2 and then progressed to needing non-rebreather.  She had a chest x ray that showed Mild congestive heart failure and interstitial edema is suspected.  A CT scan of her head was done that showed vasogenic edema from possible infection vs mets Patient has been smoking since she was 69 years old.  Wears Cpap at night only  Hospitalist were asked to admit for further work up of COPD/CHF and vasogenic brain edema- will place in SDU due to respiratory status   Review of Systems:  All systems reviewed, negative unless stated above  Past Medical History  Diagnosis Date  . HTN (hypertension)   . Obesity   . HLD (hyperlipidemia)   . Depression    Past Surgical History  Procedure Laterality Date  . Replacement total knee     Social History:  reports that she has been smoking.  She does not have any smokeless tobacco history on file. Her alcohol and drug histories are not on file.  No Known Allergies  Family History  Problem Relation Age of Onset  . CAD      Prior to Admission medications   Medication Sig Start Date End Date Taking? Authorizing Provider  aspirin 81 MG tablet Take 81 mg by mouth daily.   Yes Historical Provider, MD  calcium-vitamin D (OSCAL WITH D) 500-200 MG-UNIT per tablet Take 1 tablet by mouth daily with breakfast.   Yes Historical Provider, MD  Garlic 10 MG CAPS Take 10 mg elemental calcium/kg/hr by mouth daily.   Yes Historical Provider, MD  lisinopril-hydrochlorothiazide (PRINZIDE,ZESTORETIC)  20-25 MG per tablet Take 1 tablet by mouth daily.   Yes Historical Provider, MD  Melatonin 10 MG CAPS Take 10 mg by mouth at bedtime.   Yes Historical Provider, MD  Omega-3 Fatty Acids (FISH OIL) 1000 MG CAPS Take 1,000 mg by mouth daily.   Yes Historical Provider, MD  PARoxetine (PAXIL) 20 MG tablet Take 20 mg by mouth daily.   Yes Historical Provider, MD  simvastatin (ZOCOR) 40 MG tablet Take 40 mg by mouth daily.   Yes Historical Provider, MD   Physical Exam: Filed Vitals:   10/23/14 1538 10/23/14 1615 10/23/14 1630 10/23/14 1715  BP:  108/85  106/60  Pulse:  105  104  Temp:      TempSrc:      Resp:  20  19  SpO2: 98% 94% 83% 91%    Wt Readings from Last 3 Encounters:  No data found for Wt    General:  Mild increase work of breathing- patient says improved since admission Eyes: PERRL, normal lids, irises & conjunctiva ENT: grossly normal hearing, lips & tongue Neck: no LAD, masses or thyromegaly Cardiovascular: tachy, minimal LE edema Telemetry: sinus tachy Respiratory: not moving much air, +wheezing Abdomen: soft, ntnd, obese Skin: no rash or induration seen on limited exam Musculoskeletal: grossly normal tone BUE/BLE Psychiatric: grossly normal mood and affect, speech fluent and appropriate Neurologic: mild weakness on left hand  Labs on Admission:  Basic Metabolic Panel:  Recent Labs Lab 10/23/14 1542 10/23/14 1612  NA 142 142  K 3.5 3.1*  CL 101 98  CO2 29  --   GLUCOSE 117* 113*  BUN 14 16  CREATININE 0.78 0.70  CALCIUM 9.3  --    Liver Function Tests:  Recent Labs Lab 10/23/14 1542  AST 57*  ALT 55*  ALKPHOS 72  BILITOT 0.5  PROT 7.0  ALBUMIN 3.5   No results for input(s): LIPASE, AMYLASE in the last 168 hours. No results for input(s): AMMONIA in the last 168 hours. CBC:  Recent Labs Lab 10/23/14 1542 10/23/14 1612  WBC 13.5*  --   NEUTROABS 12.1*  --   HGB 14.9 17.0*  HCT 45.4 50.0*  MCV 88.8  --   PLT 197  --     Cardiac Enzymes:  Recent Labs Lab 10/23/14 1625  TROPONINI 0.49*    BNP (last 3 results)  Recent Labs  10/23/14 1542  BNP 95.2    ProBNP (last 3 results) No results for input(s): PROBNP in the last 8760 hours.  CBG: No results for input(s): GLUCAP in the last 168 hours.  Radiological Exams on Admission: Dg Chest 2 View  10/23/2014   CLINICAL DATA:  Pt states that she is very SOB and has been congested for the past week now, wheezing.  EXAM: CHEST  2 VIEW  COMPARISON:  01/09/2007  FINDINGS: Cardiac silhouette borderline enlarged. No mediastinal or hilar masses or evidence of adenopathy.  There is bilateral interstitial thickening. No areas of lung consolidation is seen. No pleural effusion or pneumothorax.  Bony thorax is diffusely demineralized but grossly intact.  IMPRESSION: Borderline cardiomegaly and bilateral interstitial thickening. Interstitial thickening may all be chronic, but has increased when compared to the prior chest radiograph. Mild congestive heart failure and interstitial edema is suspected.   Electronically Signed   By: Lajean Manes M.D.   On: 10/23/2014 16:50   Ct Head Wo Contrast  10/23/2014   CLINICAL DATA:  Right upper extremity weakness.  EXAM: CT HEAD WITHOUT CONTRAST  TECHNIQUE: Contiguous axial images were obtained from the base of the skull through the vertex without intravenous contrast.  COMPARISON:  None.  FINDINGS: Skull and Sinuses:No evidence of bone metastasis. The mastoids and sinuses are clear.  Orbits: No acute abnormality.  Brain: There is diffuse abnormality of the cerebral hemispheres with asymmetric white matter low density that appears to have mass effect, effacing associated sulci. This is confluent throughout the right centrum semiovale and subcortical white matter, more patchy on the left. The appearance favors vasogenic edema as seen with tumor or infection. There are also areas of small cortical low-density in the lateral right temporal  lobe, parasagittal left frontal lobe, and left parietal lobe. There is no midline shift, hydrocephalus, or acute hemorrhage.  These results were called by telephone at the time of interpretation on 10/23/2014 at 4:59 pm to Dr. Ernestina Patches , who verbally acknowledged these results.  IMPRESSION: Patchy vasogenic edema, usually seen with multi focal metastatic disease or infection. There is also multi focal cortical edema suspicious for superimposed infarcts. Brain MRI with contrast is recommended.   Electronically Signed   By: Monte Fantasia M.D.   On: 10/23/2014 17:04    EKG: Independently reviewed. Sinus tachy  Assessment/Plan Active Problems:   Stroke-like symptoms   COPD exacerbation   Vasogenic brain edema   Obesity   Troponin I above reference range  Weakness/stroke like symptoms- CT shows patchy vasogenic edema with possible multi focal metastatic disease vs infection -neuro consult -MRI with contrast -steroids (for lungs)  COPD exacerbation in a smoker -IV steroids -IV abx -nebs -CTA of chest to r/o PE and look into lung fields  Obesity  Elevated troponin -?demand  Will r/o PE -echo  ? Fluid overload -IV lasix x 1  Mild elevation of LFTs -recheck CMP  Will place in SDU at least overnight to observe   Code Status: full DVT Prophylaxis: Family Communication: patient Disposition Plan:   Time spent: [place], [facility] Pager [phone]

## 2014-10-23 NOTE — ED Provider Notes (Addendum)
CSN: [id number]     Arrival date & time 10/23/14  1421 History   First MD Initiated Contact with Patient 10/23/14 1435     Chief Complaint  Patient presents with  . Extremity Weakness     (Consider location/radiation/quality/duration/timing/severity/associated sxs/prior Treatment) Patient is a 69 y.o. female presenting with extremity weakness and shortness of breath. The history is provided by the patient. No language interpreter was used.  Extremity Weakness This is a new problem. The current episode started 6 to 12 hours ago. The problem occurs constantly. The problem has been gradually improving. Associated symptoms include shortness of breath. Pertinent negatives include no chest pain, no abdominal pain and no headaches. Nothing aggravates the symptoms. Nothing relieves the symptoms. She has tried rest for the symptoms. The treatment provided mild relief.  Shortness of Breath Severity:  Unable to specify Onset quality:  Unable to specify Timing:  Unable to specify Progression:  Unable to specify Chronicity:  New Associated symptoms: no abdominal pain, no chest pain and no headaches     No past medical history on file. No past surgical history on file. No family history on file. History  Substance Use Topics  . Smoking status: Not on file  . Smokeless tobacco: Not on file  . Alcohol Use: Not on file   OB History    No data available     Review of Systems  Respiratory: Positive for shortness of breath.   Cardiovascular: Negative for chest pain.  Gastrointestinal: Negative for abdominal pain.  Musculoskeletal: Positive for extremity weakness.  Neurological: Negative for headaches.      Allergies  Review of patient's allergies indicates no known allergies.  Home Medications   Prior to Admission medications   Medication Sig Start Date End Date Taking? Authorizing Provider  aspirin 81 MG tablet Take 81 mg by mouth daily.   Yes Historical Provider, MD   calcium-vitamin D (OSCAL WITH D) 500-200 MG-UNIT per tablet Take 1 tablet by mouth daily with breakfast.   Yes Historical Provider, MD  Garlic 10 MG CAPS Take 10 mg elemental calcium/kg/hr by mouth daily.   Yes Historical Provider, MD  lisinopril-hydrochlorothiazide (PRINZIDE,ZESTORETIC) 20-25 MG per tablet Take 1 tablet by mouth daily.   Yes Historical Provider, MD  Melatonin 10 MG CAPS Take 10 mg by mouth at bedtime.   Yes Historical Provider, MD  Omega-3 Fatty Acids (FISH OIL) 1000 MG CAPS Take 1,000 mg by mouth daily.   Yes Historical Provider, MD  PARoxetine (PAXIL) 20 MG tablet Take 20 mg by mouth daily.   Yes Historical Provider, MD  simvastatin (ZOCOR) 40 MG tablet Take 40 mg by mouth daily.   Yes Historical Provider, MD   BP 106/60 mmHg  Pulse 104  Temp(Src) 98.1 F (36.7 C) (Oral)  Resp 19  SpO2 91% Physical Exam  Constitutional: She is oriented to person, place, and time. She appears well-developed and well-nourished. No distress.  HENT:  Head: Normocephalic and atraumatic.  Mouth/Throat: No oropharyngeal exudate.  Eyes: Pupils are equal, round, and reactive to light.  Neck: Normal range of motion. Neck supple.  Cardiovascular: Normal rate, regular rhythm and normal heart sounds.  Exam reveals no gallop and no friction rub.   No murmur heard. Pulmonary/Chest: Effort normal. No respiratory distress. She has wheezes in the right upper field, the right middle field, the right lower field, the left upper field, the left middle field and the left lower field. She has no rales.  Abdominal: Soft. Bowel sounds are  normal. She exhibits no distension and no mass. There is no tenderness. There is no rebound and no guarding.  Musculoskeletal: Normal range of motion. She exhibits no edema or tenderness.  Neurological: She is alert and oriented to person, place, and time. A cranial nerve deficit is present. No sensory deficit. She exhibits abnormal muscle tone.  She has a mild left-sided  ptosis and 4 out of 5 strength in the left upper extremity with normal sensation throughout.  Skin: Skin is warm and dry.  Psychiatric: She has a normal mood and affect.    ED Course  Procedures (including critical care time) Labs Review Labs Reviewed  CBC - Abnormal; Notable for the following:    WBC 13.5 (*)    All other components within normal limits  DIFFERENTIAL - Abnormal; Notable for the following:    Neutrophils Relative % 90 (*)    Neutro Abs 12.1 (*)    Lymphocytes Relative 7 (*)    All other components within normal limits  COMPREHENSIVE METABOLIC PANEL - Abnormal; Notable for the following:    Glucose, Bld 117 (*)    AST 57 (*)    ALT 55 (*)    GFR calc non Af Amer 84 (*)    All other components within normal limits  I-STAT CHEM 8, ED - Abnormal; Notable for the following:    Potassium 3.1 (*)    Glucose, Bld 113 (*)    Hemoglobin 17.0 (*)    HCT 50.0 (*)    All other components within normal limits  I-STAT TROPOININ, ED - Abnormal; Notable for the following:    Troponin i, poc 0.36 (*)    All other components within normal limits  I-STAT ARTERIAL BLOOD GAS, ED - Abnormal; Notable for the following:    pCO2 arterial 45.9 (*)    Bicarbonate 25.6 (*)    All other components within normal limits  ETHANOL  PROTIME-INR  APTT  BRAIN NATRIURETIC PEPTIDE  URINE RAPID DRUG SCREEN (HOSP PERFORMED)  URINALYSIS, ROUTINE W REFLEX MICROSCOPIC  TROPONIN I  I-STAT TROPOININ, ED    Imaging Review Dg Chest 2 View  10/23/2014   CLINICAL DATA:  Pt states that she is very SOB and has been congested for the past week now, wheezing.  EXAM: CHEST  2 VIEW  COMPARISON:  01/09/2007  FINDINGS: Cardiac silhouette borderline enlarged. No mediastinal or hilar masses or evidence of adenopathy.  There is bilateral interstitial thickening. No areas of lung consolidation is seen. No pleural effusion or pneumothorax.  Bony thorax is diffusely demineralized but grossly intact.  IMPRESSION:  Borderline cardiomegaly and bilateral interstitial thickening. Interstitial thickening may all be chronic, but has increased when compared to the prior chest radiograph. Mild congestive heart failure and interstitial edema is suspected.   Electronically Signed   By: Lajean Manes M.D.   On: 10/23/2014 16:50   Ct Head Wo Contrast  10/23/2014   CLINICAL DATA:  Right upper extremity weakness.  EXAM: CT HEAD WITHOUT CONTRAST  TECHNIQUE: Contiguous axial images were obtained from the base of the skull through the vertex without intravenous contrast.  COMPARISON:  None.  FINDINGS: Skull and Sinuses:No evidence of bone metastasis. The mastoids and sinuses are clear.  Orbits: No acute abnormality.  Brain: There is diffuse abnormality of the cerebral hemispheres with asymmetric white matter low density that appears to have mass effect, effacing associated sulci. This is confluent throughout the right centrum semiovale and subcortical white matter, more patchy on the left.  The appearance favors vasogenic edema as seen with tumor or infection. There are also areas of small cortical low-density in the lateral right temporal lobe, parasagittal left frontal lobe, and left parietal lobe. There is no midline shift, hydrocephalus, or acute hemorrhage.  These results were called by telephone at the time of interpretation on 10/23/2014 at 4:59 pm to Dr. Ernestina Patches , who verbally acknowledged these results.  IMPRESSION: Patchy vasogenic edema, usually seen with multi focal metastatic disease or infection. There is also multi focal cortical edema suspicious for superimposed infarcts. Brain MRI with contrast is recommended.   Electronically Signed   By: Monte Fantasia M.D.   On: 10/23/2014 17:04     EKG Interpretation   Date/Time:  Thursday October 23 2014 15:32:47 EST Ventricular Rate:  109 PR Interval:  170 QRS Duration: 96 QT Interval:  353 QTC Calculation: 475 R Axis:   75 Text Interpretation:  Sinus tachycardia  Confirmed by DOCHERTY  MD, MEGAN  ([id number]) on 10/23/2014 4:17:14 PM      MDM   Final diagnoses:  Left arm weakness  COPD exacerbation  Abnormal CT of brain  Elevated troponin    Pt is a 69 y.o. female with Pmhx as above who presents with sudden onset left upper extremity weakness beginning around 10 AM this morning.  She also complains of feeling weak all over and fatigued.  She feels sleepy.  On physical exam she is mildly tachycardic into.  She is audibly wheezing.  She has a mild left-sided ptosis and 4 out of 5 strength in the left upper extremity with normal sensation throughout.  No abnormal coordination.  She reports having a similar episode in the left upper extremity about a week or 2 ago without was present only briefly.  Given concern for CVA/TIA CT had ordered.  She is also a long-standing smoker and has what I believe is likely to be chronic lung disease.  DuoNeb ordered.  Given she is reporting excessive sleepiness.  ABG ordered to rule out hypercarbia.  Istat trop 0.36, Trop-I sent. Co2 45.9, no priors.  BNP not elevated. CXR w/ borderline cardiomegaly and BL interstitial thickening.  CT head with patchy vasogenic edema usually see with multifocal metastatic disease or infection.  She also has multifocal cortical edema suspicious for superimposed infarcts.  MRI brain with contrast is recommended.  I spoke with [facility], who will admit.  Hospitalist consulted.      Ernestina Patches, MD 10/23/14 [place], MD 11/19/14 [phone]

## 2014-10-23 NOTE — Consult Note (Signed)
Referring Physician: Eliseo Squires    Chief Complaint: Left upper extremity paresthesias  HPI: Caitlin Carter is an 69 y.o. female who reports that about 2 weeks ago she had an episode of numbness in her left upper extremity with stiffening and the arm acting as if it had a life of its own.  This episode lasted a few min.tes then resolved.  This morning the patient had another episode lasting about 5 minutes that resolved spontaneously as well.  She has also been experiencing shortness of breath.  She has not experienced any other episodes of numbness or weakness.    Date last known well: Date: 10/23/2014 Time last known well: Time: 09:30 tPA Given: No: Resolution of symptoms  Past Medical History  Diagnosis Date  . HTN (hypertension)   . Obesity   . HLD (hyperlipidemia)   . Depression     Past Surgical History  Procedure Laterality Date  . Replacement total knee      Family History  Problem Relation Age of Onset  . CAD     Father died of lung cancer.  Brother with lung cancer.  Mother died of breast cancer.    Social History:  reports that she has been smoking.  She does not have any smokeless tobacco history on file. Her alcohol and drug histories are not on file.  Allergies: No Known Allergies  Medications:  I have reviewed the patient's current medications. Prior to Admission:  Prescriptions prior to admission  Medication Sig Dispense Refill Last Dose  . aspirin 81 MG tablet Take 81 mg by mouth daily.   10/23/2014 at Unknown time  . calcium-vitamin D (OSCAL WITH D) 500-200 MG-UNIT per tablet Take 1 tablet by mouth daily with breakfast.   10/23/2014 at Unknown time  . Garlic 10 MG CAPS Take 10 mg elemental calcium/kg/hr by mouth daily.   10/23/2014 at Unknown time  . lisinopril-hydrochlorothiazide (PRINZIDE,ZESTORETIC) 20-25 MG per tablet Take 1 tablet by mouth daily.   10/23/2014 at Unknown time  . Melatonin 10 MG CAPS Take 10 mg by mouth at bedtime.   10/22/2014 at Unknown time  .  Omega-3 Fatty Acids (FISH OIL) 1000 MG CAPS Take 1,000 mg by mouth daily.   10/23/2014 at Unknown time  . PARoxetine (PAXIL) 20 MG tablet Take 20 mg by mouth daily.   10/22/2014 at Unknown time  . simvastatin (ZOCOR) 40 MG tablet Take 40 mg by mouth daily.   10/22/2014 at Unknown time   Scheduled: .  stroke: mapping our early stages of recovery book   Does not apply Once  . [START ON 10/24/2014] aspirin EC  81 mg Oral Daily  . [START ON 10/24/2014] calcium-vitamin D  1 tablet Oral Q breakfast  . enoxaparin (LOVENOX) injection  40 mg Subcutaneous Q24H  . furosemide  40 mg Intravenous Once  . ipratropium-albuterol  3 mL Nebulization Q4H  . levofloxacin (LEVAQUIN) IV  750 mg Intravenous Q24H  . methylPREDNISolone (SOLU-MEDROL) injection  60 mg Intravenous Q6H  . PARoxetine  20 mg Oral Daily  . [START ON 10/24/2014] simvastatin  40 mg Oral q1800    ROS: History obtained from the patient  General ROS: negative for - chills, fatigue, fever, night sweats, weight gain or weight loss Psychological ROS: negative for - behavioral disorder, hallucinations, memory difficulties, mood swings or suicidal ideation Ophthalmic ROS: negative for - blurry vision, double vision, eye pain or loss of vision ENT ROS: negative for - epistaxis, nasal discharge, oral lesions, sore throat,  tinnitus or vertigo Allergy and Immunology ROS: negative for - hives or itchy/watery eyes Hematological and Lymphatic ROS: negative for - bleeding problems, bruising or swollen lymph nodes Endocrine ROS: negative for - galactorrhea, hair pattern changes, polydipsia/polyuria or temperature intolerance Respiratory ROS: cough, shortness of breath Cardiovascular ROS: lower extremity edema  Gastrointestinal ROS: negative for - abdominal pain, diarrhea, hematemesis, nausea/vomiting or stool incontinence Genito-Urinary ROS: negative for - dysuria, hematuria, incontinence or urinary frequency/urgency Musculoskeletal ROS: negative for - joint  swelling or muscular weakness Neurological ROS: as noted in HPI Dermatological ROS: right thigh bruise  Physical Examination: Blood pressure 133/63, pulse 108, temperature 98.8 F (37.1 C), temperature source Oral, resp. rate 24, height 5\' 6"  (1.676 m), weight 129.8 kg (286 lb 2.5 oz), SpO2 97 %.  HEENT-  Normocephalic, no lesions, without obvious abnormality.  Normal external eye and conjunctiva.  Normal TM's bilaterally.  Normal auditory canals and external ears. Normal external nose, mucus membranes and septum.  Normal pharynx. Cardiovascular- S1, S2 normal, pulses palpable throughout   Lungs- + wheezing Abdomen- soft, non-tender; bowel sounds normal; no masses,  no organomegaly Extremities- 2+ pitting edema in the lower extremities bilaterally Lymph-no adenopathy palpable Musculoskeletal-no joint tenderness, deformity or swelling Skin-evolving bruise above the right knee  Neurological Examination Mental Status: Alert, oriented, thought content appropriate.  Speech fluent without evidence of aphasia.  Able to follow 3 step commands without difficulty. Cranial Nerves: II: Discs flat bilaterally; Visual fields grossly normal, pupils equal, round, reactive to light and accommodation III,IV, VI: left ptosis, extra-ocular motions intact bilaterally V,VII: smile symmetric, facial light touch sensation normal bilaterally VIII: hearing normal bilaterally IX,X: gag reflex present XI: bilateral shoulder shrug XII: midline tongue extension Motor: Right : Upper extremity   5/5    Left:     Upper extremity   5-/5 with pronator drift  Lower extremity   5/5     Lower extremity   5/5 Tone and bulk:normal tone throughout; no atrophy noted Sensory: Pinprick and light touch intact throughout, bilaterally Deep Tendon Reflexes: 2+ and symmetric throughout Plantars: Right: mute   Left: mute Cerebellar: normal finger-to-nose and normal heel-to-shin testingbilaterally   Laboratory Studies:  Basic  Metabolic Panel:  Recent Labs Lab 10/23/14 1542 10/23/14 1612  NA 142 142  K 3.5 3.1*  CL 101 98  CO2 29  --   GLUCOSE 117* 113*  BUN 14 16  CREATININE 0.78 0.70  CALCIUM 9.3  --     Liver Function Tests:  Recent Labs Lab 10/23/14 1542  AST 57*  ALT 55*  ALKPHOS 72  BILITOT 0.5  PROT 7.0  ALBUMIN 3.5   No results for input(s): LIPASE, AMYLASE in the last 168 hours. No results for input(s): AMMONIA in the last 168 hours.  CBC:  Recent Labs Lab 10/23/14 1542 10/23/14 1612  WBC 13.5*  --   NEUTROABS 12.1*  --   HGB 14.9 17.0*  HCT 45.4 50.0*  MCV 88.8  --   PLT 197  --     Cardiac Enzymes:  Recent Labs Lab 10/23/14 1625  TROPONINI 0.49*    BNP: Invalid input(s): POCBNP  CBG: No results for input(s): GLUCAP in the last 168 hours.  Microbiology: No results found for this or any previous visit.  Coagulation Studies:  Recent Labs  10/23/14 1542  LABPROT 13.2  INR 0.99    Urinalysis:  Recent Labs Lab 10/23/14 1628  COLORURINE YELLOW  LABSPEC 1.015  PHURINE 6.0  GLUCOSEU NEGATIVE  HGBUR  NEGATIVE  BILIRUBINUR NEGATIVE  KETONESUR NEGATIVE  PROTEINUR 30*  UROBILINOGEN 0.2  NITRITE NEGATIVE  LEUKOCYTESUR NEGATIVE    Lipid Panel: No results found for: CHOL, TRIG, HDL, CHOLHDL, VLDL, LDLCALC  HgbA1C: No results found for: HGBA1C  Urine Drug Screen:     Component Value Date/Time   LABOPIA NONE DETECTED 10/23/2014 1628   COCAINSCRNUR NONE DETECTED 10/23/2014 1628   LABBENZ NONE DETECTED 10/23/2014 1628   AMPHETMU NONE DETECTED 10/23/2014 1628   THCU NONE DETECTED 10/23/2014 1628   LABBARB NONE DETECTED 10/23/2014 1628    Alcohol Level:  Recent Labs Lab 10/23/14 1542  ETH <5    Other results: EKG: sinus tachycardia at 109 bpm.  Imaging: Dg Chest 2 View  10/23/2014   CLINICAL DATA:  Pt states that she is very SOB and has been congested for the past week now, wheezing.  EXAM: CHEST  2 VIEW  COMPARISON:  01/09/2007   FINDINGS: Cardiac silhouette borderline enlarged. No mediastinal or hilar masses or evidence of adenopathy.  There is bilateral interstitial thickening. No areas of lung consolidation is seen. No pleural effusion or pneumothorax.  Bony thorax is diffusely demineralized but grossly intact.  IMPRESSION: Borderline cardiomegaly and bilateral interstitial thickening. Interstitial thickening may all be chronic, but has increased when compared to the prior chest radiograph. Mild congestive heart failure and interstitial edema is suspected.   Electronically Signed   By: Lajean Manes M.D.   On: 10/23/2014 16:50   Ct Head Wo Contrast  10/23/2014   CLINICAL DATA:  Right upper extremity weakness.  EXAM: CT HEAD WITHOUT CONTRAST  TECHNIQUE: Contiguous axial images were obtained from the base of the skull through the vertex without intravenous contrast.  COMPARISON:  None.  FINDINGS: Skull and Sinuses:No evidence of bone metastasis. The mastoids and sinuses are clear.  Orbits: No acute abnormality.  Brain: There is diffuse abnormality of the cerebral hemispheres with asymmetric white matter low density that appears to have mass effect, effacing associated sulci. This is confluent throughout the right centrum semiovale and subcortical white matter, more patchy on the left. The appearance favors vasogenic edema as seen with tumor or infection. There are also areas of small cortical low-density in the lateral right temporal lobe, parasagittal left frontal lobe, and left parietal lobe. There is no midline shift, hydrocephalus, or acute hemorrhage.  These results were called by telephone at the time of interpretation on 10/23/2014 at 4:59 pm to Dr. Ernestina Patches , who verbally acknowledged these results.  IMPRESSION: Patchy vasogenic edema, usually seen with multi focal metastatic disease or infection. There is also multi focal cortical edema suspicious for superimposed infarcts. Brain MRI with contrast is recommended.    Electronically Signed   By: Monte Fantasia M.D.   On: 10/23/2014 17:04   Ct Angio Chest Pe W/cm &/or Wo Cm  10/23/2014   CLINICAL DATA:  Shortness of breath for 2 days, worse this morning.  EXAM: CT ANGIOGRAPHY CHEST WITH CONTRAST  TECHNIQUE: Multidetector CT imaging of the chest was performed using the standard protocol during bolus administration of intravenous contrast. Multiplanar CT image reconstructions and MIPs were obtained to evaluate the vascular anatomy.  CONTRAST:  134mL OMNIPAQUE IOHEXOL 350 MG/ML SOLN  COMPARISON:  None.  FINDINGS: Technically adequate study with moderately good opacification of the central and proximal segmental pulmonary arteries. Contrast bolus is somewhat limited and there is motion artifact, which limits evaluation of peripheral pulmonary arteries. No filling defects demonstrated suggesting no evidence of significant central pulmonary  embolus.  Mild cardiac enlargement. Normal caliber thoracic aorta. Aortic calcification. Great vessel origins appear patent. Coronary artery calcifications. Esophagus is decompressed. No significant lymphadenopathy in the chest.  Evaluation of lungs is limited due to respiratory motion artifact. There appears to be patchy airspace disease in the lungs which may indicate edema or multifocal pneumonia. No pleural effusions. No pneumothorax. Airways appear patent although the lower trachea is somewhat narrowed.  Included portions of the upper abdominal organs are grossly unremarkable. Degenerative changes throughout the spine. No destructive bone lesions.  Review of the MIP images confirms the above findings.  IMPRESSION: No evidence of significant central pulmonary embolus. Peripheral vessels are not well demonstrated. Cardiac enlargement with hazy parenchymal opacities suggesting congestive failure with edema.   Electronically Signed   By: Lucienne Capers M.D.   On: 10/23/2014 19:32    Assessment: 69 y.o. female presenting with episodes of LUE  numbness and stiffening.  Current neurological examination is only significant for a left ptosis and mild left upper extremity numbness.  Head CT personally reviewed and shows multiple areas of hypodensity consistent with vasogenic edema that are noted bilaterally.  Etiology is unclear.  Concern is for metastatic disease.  Further work up recommended.    Stroke Risk Factors - hypertension and smoking  Plan: 1. MRI of the brain with and without contrast 2. Decadron 4mg  IV q 6 hours 3. Concern is that episodes of numbness and stiffening were seizure.  Recommend Keppra 500mg  q 12 hours.  First dose IV.     Alexis Goodell, MD [facility] [phone] 10/23/2014, 9:11 PM

## 2014-10-23 NOTE — ED Notes (Signed)
Admitting physician paged in order to be notified of changes in pt condition.

## 2014-10-23 NOTE — ED Notes (Addendum)
Pt O2 SAT dropped and maintained at 85% while on the 4L.  Pt instructed to take slow deep breaths through nose and O2 returned to mid 90's but would not maintained.  ED MD made aware and respiratory tech called and will come evaluate pt.  Pt placed on venti mask at 50%.

## 2014-10-23 NOTE — ED Notes (Signed)
Admitting MD at bedside.

## 2014-10-23 NOTE — ED Notes (Signed)
Patient transported to xray, on 4L of 02, at Dr.Docherty request, cause patient was 83% after returning back from bathroom.

## 2014-10-23 NOTE — ED Notes (Signed)
Patient C/O left arm weakness that began at 0930 this am.  Patient now C/O feeling weak all over and fatigue.  States that she is very sleepy.  Patient states that this has never happened to her before.  Patient has a productive cough. Denies fever, chills but C/O nausea this AM.

## 2014-10-23 NOTE — Progress Notes (Signed)
Patient sats were dropping to 80s.  Placed patient on 50% venti mask with improvement of sat to 96%.  Will continue to monitor.

## 2014-10-23 NOTE — ED Notes (Signed)
Attempted to call report to floor.  Nurse unavailable at this time and will call back.

## 2014-10-24 ENCOUNTER — Encounter (HOSPITAL_COMMUNITY): Payer: Self-pay | Admitting: *Deleted

## 2014-10-24 ENCOUNTER — Ambulatory Visit: Payer: Self-pay | Admitting: Internal Medicine

## 2014-10-24 DIAGNOSIS — D499 Neoplasm of unspecified behavior of unspecified site: Secondary | ICD-10-CM | POA: Diagnosis present

## 2014-10-24 DIAGNOSIS — E876 Hypokalemia: Secondary | ICD-10-CM

## 2014-10-24 DIAGNOSIS — G936 Cerebral edema: Secondary | ICD-10-CM

## 2014-10-24 DIAGNOSIS — R7989 Other specified abnormal findings of blood chemistry: Secondary | ICD-10-CM

## 2014-10-24 DIAGNOSIS — R778 Other specified abnormalities of plasma proteins: Secondary | ICD-10-CM | POA: Diagnosis present

## 2014-10-24 DIAGNOSIS — I509 Heart failure, unspecified: Secondary | ICD-10-CM

## 2014-10-24 DIAGNOSIS — I6789 Other cerebrovascular disease: Secondary | ICD-10-CM

## 2014-10-24 DIAGNOSIS — C7931 Secondary malignant neoplasm of brain: Principal | ICD-10-CM

## 2014-10-24 DIAGNOSIS — Z720 Tobacco use: Secondary | ICD-10-CM | POA: Diagnosis present

## 2014-10-24 LAB — CBC WITH DIFFERENTIAL/PLATELET
Basophils Absolute: 0 10*3/uL (ref 0.0–0.1)
Basophils Relative: 0 % (ref 0–1)
EOS ABS: 0 10*3/uL (ref 0.0–0.7)
Eosinophils Relative: 0 % (ref 0–5)
HCT: 46.3 % — ABNORMAL HIGH (ref 36.0–46.0)
Hemoglobin: 15.1 g/dL — ABNORMAL HIGH (ref 12.0–15.0)
Lymphocytes Relative: 7 % — ABNORMAL LOW (ref 12–46)
Lymphs Abs: 1 10*3/uL (ref 0.7–4.0)
MCH: 29.9 pg (ref 26.0–34.0)
MCHC: 32.6 g/dL (ref 30.0–36.0)
MCV: 91.7 fL (ref 78.0–100.0)
Monocytes Absolute: 0.9 10*3/uL (ref 0.1–1.0)
Monocytes Relative: 6 % (ref 3–12)
NEUTROS PCT: 87 % — AB (ref 43–77)
Neutro Abs: 13.9 10*3/uL — ABNORMAL HIGH (ref 1.7–7.7)
PLATELETS: 199 10*3/uL (ref 150–400)
RBC: 5.05 MIL/uL (ref 3.87–5.11)
RDW: 14.4 % (ref 11.5–15.5)
WBC: 15.8 10*3/uL — ABNORMAL HIGH (ref 4.0–10.5)

## 2014-10-24 LAB — CBC
HCT: 43.7 % (ref 36.0–46.0)
Hemoglobin: 14.3 g/dL (ref 12.0–15.0)
MCH: 29.4 pg (ref 26.0–34.0)
MCHC: 32.7 g/dL (ref 30.0–36.0)
MCV: 89.7 fL (ref 78.0–100.0)
Platelets: 179 10*3/uL (ref 150–400)
RBC: 4.87 MIL/uL (ref 3.87–5.11)
RDW: 14.2 % (ref 11.5–15.5)
WBC: 8.3 10*3/uL (ref 4.0–10.5)

## 2014-10-24 LAB — BLOOD GAS, ARTERIAL
Acid-Base Excess: 0.9 mmol/L (ref 0.0–2.0)
BICARBONATE: 24.7 meq/L — AB (ref 20.0–24.0)
Drawn by: 10006
O2 Content: 3 L/min
O2 Saturation: 95.4 %
PATIENT TEMPERATURE: 98.6
PO2 ART: 77.7 mmHg — AB (ref 80.0–100.0)
TCO2: 25.8 mmol/L (ref 0–100)
pCO2 arterial: 37.3 mmHg (ref 35.0–45.0)
pH, Arterial: 7.436 (ref 7.350–7.450)

## 2014-10-24 LAB — TROPONIN I
TROPONIN I: 1.32 ng/mL — AB (ref <0.031)
Troponin I: 1.11 ng/mL (ref <0.031)

## 2014-10-24 LAB — COMPREHENSIVE METABOLIC PANEL
ALT: 76 U/L — ABNORMAL HIGH (ref 0–35)
ANION GAP: 10 (ref 5–15)
AST: 77 U/L — ABNORMAL HIGH (ref 0–37)
Albumin: 3.3 g/dL — ABNORMAL LOW (ref 3.5–5.2)
Alkaline Phosphatase: 66 U/L (ref 39–117)
BUN: 15 mg/dL (ref 6–23)
CO2: 26 mmol/L (ref 19–32)
CREATININE: 0.84 mg/dL (ref 0.50–1.10)
Calcium: 8.9 mg/dL (ref 8.4–10.5)
Chloride: 103 mmol/L (ref 96–112)
GFR, EST AFRICAN AMERICAN: 81 mL/min — AB (ref >90)
GFR, EST NON AFRICAN AMERICAN: 70 mL/min — AB (ref >90)
GLUCOSE: 153 mg/dL — AB (ref 70–99)
POTASSIUM: 3 mmol/L — AB (ref 3.5–5.1)
Sodium: 139 mmol/L (ref 135–145)
TOTAL PROTEIN: 6.6 g/dL (ref 6.0–8.3)
Total Bilirubin: 0.5 mg/dL (ref 0.3–1.2)

## 2014-10-24 LAB — LIPID PANEL
CHOL/HDL RATIO: 4.2 ratio
Cholesterol: 160 mg/dL (ref 0–200)
HDL: 38 mg/dL — ABNORMAL LOW (ref >39)
LDL Cholesterol: 88 mg/dL (ref 0–99)
Triglycerides: 170 mg/dL — ABNORMAL HIGH (ref <150)
VLDL: 34 mg/dL (ref 0–40)

## 2014-10-24 LAB — MRSA PCR SCREENING: MRSA by PCR: NEGATIVE

## 2014-10-24 LAB — MAGNESIUM: Magnesium: 1.9 mg/dL (ref 1.5–2.5)

## 2014-10-24 MED ORDER — NICOTINE 21 MG/24HR TD PT24
21.0000 mg | MEDICATED_PATCH | Freq: Every day | TRANSDERMAL | Status: DC
  Administered 2014-10-24 – 2014-10-30 (×7): 21 mg via TRANSDERMAL
  Filled 2014-10-24 (×7): qty 1

## 2014-10-24 MED ORDER — FUROSEMIDE 10 MG/ML IJ SOLN
40.0000 mg | Freq: Two times a day (BID) | INTRAMUSCULAR | Status: DC
  Administered 2014-10-24 – 2014-10-27 (×7): 40 mg via INTRAVENOUS
  Filled 2014-10-24 (×8): qty 4

## 2014-10-24 MED ORDER — MORPHINE SULFATE 2 MG/ML IJ SOLN
1.0000 mg | Freq: Once | INTRAMUSCULAR | Status: AC
  Administered 2014-10-24: 1 mg via INTRAVENOUS
  Filled 2014-10-24: qty 1

## 2014-10-24 MED ORDER — POTASSIUM CHLORIDE CRYS ER 20 MEQ PO TBCR
40.0000 meq | EXTENDED_RELEASE_TABLET | Freq: Two times a day (BID) | ORAL | Status: DC
  Administered 2014-10-24 – 2014-10-27 (×6): 40 meq via ORAL
  Filled 2014-10-24 (×8): qty 2

## 2014-10-24 MED ORDER — DIPHENHYDRAMINE HCL 12.5 MG/5ML PO ELIX
12.5000 mg | ORAL_SOLUTION | Freq: Once | ORAL | Status: AC
  Administered 2014-10-24: 12.5 mg via ORAL
  Filled 2014-10-24: qty 5

## 2014-10-24 MED ORDER — GADOBENATE DIMEGLUMINE 529 MG/ML IV SOLN
20.0000 mL | Freq: Once | INTRAVENOUS | Status: AC | PRN
  Administered 2014-10-24: 20 mL via INTRAVENOUS

## 2014-10-24 MED ORDER — IBUPROFEN 600 MG PO TABS
600.0000 mg | ORAL_TABLET | Freq: Once | ORAL | Status: AC
  Administered 2014-10-24: 600 mg via ORAL
  Filled 2014-10-24: qty 1

## 2014-10-24 MED ORDER — ONDANSETRON HCL 4 MG/2ML IJ SOLN: 4.0000 mg | Freq: Four times a day (QID) | INTRAMUSCULAR | Status: DC | PRN

## 2014-10-24 MED ORDER — METHYLPREDNISOLONE SODIUM SUCC 125 MG IJ SOLR: 60.0000 mg | INTRAMUSCULAR | Status: DC

## 2014-10-24 MED ORDER — POTASSIUM CHLORIDE CRYS ER 20 MEQ PO TBCR
60.0000 meq | EXTENDED_RELEASE_TABLET | Freq: Once | ORAL | Status: AC
  Administered 2014-10-24: 60 meq via ORAL
  Filled 2014-10-24: qty 3

## 2014-10-24 MED ORDER — DEXAMETHASONE SODIUM PHOSPHATE 4 MG/ML IJ SOLN
4.0000 mg | Freq: Four times a day (QID) | INTRAMUSCULAR | Status: DC
  Administered 2014-10-24 – 2014-10-30 (×22): 4 mg via INTRAVENOUS
  Filled 2014-10-24 (×27): qty 1

## 2014-10-24 MED ORDER — ZOLPIDEM TARTRATE 5 MG PO TABS: 5.0000 mg | ORAL_TABLET | Freq: Every evening | ORAL | Status: DC | PRN

## 2014-10-24 MED ORDER — IOHEXOL 300 MG/ML  SOLN
25.0000 mL | INTRAMUSCULAR | Status: AC
  Administered 2014-10-24 (×2): 25 mL via ORAL

## 2014-10-24 NOTE — Progress Notes (Signed)
Utilization review completed.  

## 2014-10-24 NOTE — Progress Notes (Signed)
Tonight, pt's 2nd troponin came back even further elevated. Pt denies any active chest pain since admission. Given she has several risk factors for CAD, was going to start Heparin drip for r/o ACS. However, pt was admitted with stroke symptoms earlier and her MRI brain showed metastasis with ? primary. This NP called Dr. Doy Mince from neuro to discuss. Given pt is NOT having chest pain and has not had any EKG changes, believe risk of bleeding with mets outweighs use of Heparin empirically for ACS at this time. Cardio knows about the elevated troponin that will require am consult. Will follow. Clance Boll, NP [facility]

## 2014-10-24 NOTE — Progress Notes (Signed)
  Echocardiogram 2D Echocardiogram has been performed.  Caitlin Carter 10/24/2014, 2:25 PM

## 2014-10-24 NOTE — Evaluation (Signed)
Physical Therapy Evaluation Patient Details Name: AMIREE NO MRN: [id number] DOB: [birth year] Today's Date: 10/24/2014   History of Present Illness  pt presents with L sided weakness and MRI found to show Metestatic Disease.    Clinical Impression  Pt anxious and bed alarming on PT's arrival.  Pt had taken off her O2 and O2 sensor and was wanting to go to the bathroom, but had not called for A.  Utilized bedside commode for safety and pt needed total A for peri hygiene.  Pt lives alone and only mentioned neighbors being available for some A and otherwise did not have any help.  Pt may need to consider SNF at D/C for safety and to maximize independence prior to returning to home.  Will continue to follow.      Follow Up Recommendations SNF    Equipment Recommendations  Rolling walker with 5" wheels    Recommendations for Other Services       Precautions / Restrictions Precautions Precautions: Fall Precaution Comments: Watch O2 Restrictions Weight Bearing Restrictions: No      Mobility  Bed Mobility Overal bed mobility: Needs Assistance Bed Mobility: Supine to Sit;Sit to Supine     Supine to sit: Supervision Sit to supine: Supervision   General bed mobility comments: pt impulsive and bed alarming on PT's arrival as pt wanted to use bathroom, but did not ring for A.  pt had taken O2 off and O2 sensor.    Transfers Overall transfer level: Needs assistance Equipment used: 1 person hand held assist Transfers: Sit to/from [facility] Sit to Stand: Min guard Stand pivot transfers: Min guard       General transfer comment: pt needs cues for safety and attending to lines as pt somewhat impulsive.    Ambulation/Gait                Stairs            Wheelchair Mobility    Modified Rankin (Stroke Patients Only)       Balance Overall balance assessment: Needs assistance Sitting-balance support: No upper extremity supported;Feet  supported Sitting balance-Leahy Scale: Good     Standing balance support: No upper extremity supported;During functional activity Standing balance-Leahy Scale: Fair Standing balance comment: pt only able to maintain standing briefly without UE support.                               Pertinent Vitals/Pain Pain Assessment: No/denies pain    Home Living Family/patient expects to be discharged to:: Skilled nursing facility Living Arrangements: Alone                    Prior Function Level of Independence: Needs assistance      ADL's / Homemaking Assistance Needed: Has a cleanign lady for heavy cleaning and neighbors who can A with carrying groceries when needed.          Hand Dominance        Extremity/Trunk Assessment   Upper Extremity Assessment: Defer to OT evaluation           Lower Extremity Assessment: Generalized weakness      Cervical / Trunk Assessment: Normal  Communication   Communication: No difficulties  Cognition Arousal/Alertness: Awake/alert Behavior During Therapy: Restless;Impulsive Overall Cognitive Status: Impaired/Different from baseline Area of Impairment: Attention;Safety/judgement;Awareness;Problem solving   Current Attention Level: Sustained     Safety/Judgement: Decreased awareness of safety;Decreased awareness  of deficits Awareness: Emergent Problem Solving: Difficulty sequencing;Requires verbal cues;Requires tactile cues General Comments: pt anxious throughout session and needs cueing to calm down and focus on task and safety.      General Comments      Exercises        Assessment/Plan    PT Assessment Patient needs continued PT services  PT Diagnosis Difficulty walking   PT Problem List Decreased strength;Decreased activity tolerance;Decreased balance;Decreased mobility;Decreased coordination;Decreased cognition;Decreased knowledge of use of DME;Decreased safety awareness;Cardiopulmonary status limiting  activity;Obesity  PT Treatment Interventions DME instruction;Gait training;Stair training;Functional mobility training;Therapeutic activities;Therapeutic exercise;Balance training;Neuromuscular re-education;Cognitive remediation;Patient/family education   PT Goals (Current goals can be found in the Care Plan section) Acute Rehab PT Goals Patient Stated Goal: Find out what's wrong with me.   PT Goal Formulation: With patient Time For Goal Achievement: 11/07/14 Potential to Achieve Goals: Good    Frequency Min 3X/week   Barriers to discharge Decreased caregiver support      Co-evaluation               End of Session Equipment Utilized During Treatment: Oxygen Activity Tolerance: Patient limited by fatigue Patient left: in bed;with call bell/phone within reach;with bed alarm set Nurse Communication: Mobility status         Time: 1165-7903 PT Time Calculation (min) (ACUTE ONLY): 11 min   Charges:   PT Evaluation $Initial PT Evaluation Tier I: 1 Procedure     PT G CodesCatarina Hartshorn, [place] 10/24/2014, 3:40 PM

## 2014-10-24 NOTE — Progress Notes (Signed)
[place] TEAM 1 - Stepdown/ICU TEAM Progress Note  PARVEEN FREEHLING QBV:[id number] DOB: 1946/05/06 DOA: 10/23/2014 PCP: Antony Blackbird, MD  Admit HPI / Brief Narrative: LILO WALLINGTON is a 69 y.o. Female PMHx  depression, HTN, HLD, OSA on CPAP, obesity, Who comes in c/o b/l arm weakness- left > right. This same thing happened 2 weeks ago but resolved on its own. She also has developed worsening SOB and cough. No fever, no chills. Denies weight loss  In the ER, she was hypoxic, requiring 4 L O2 and then progressed to needing non-rebreather. She had a chest x ray that showed Mild congestive heart failure and interstitial edema is suspected. A CT scan of her head was done that showed vasogenic edema from possible infection vs mets Patient has been smoking since she was 69 years old. Wears Cpap at night only  Hospitalist were asked to admit for further work up of COPD/CHF and vasogenic brain edema- will place in SDU due to respiratory status    HPI/Subjective: 2/12 A/O 4, states does not use home O2, patient and daughter state that over the last week slow decline where patient's cognition was decreasing, increased wheezing. States has productive cough but unable to fully expectorate.   Assessment/Plan: COPD exacerbation in a smoker -Continue Solu-Medrol 60 mg Daily -Continue Levofloxacin x 10 days -Duoneb q 4 hr -Sputum pending -Respiratory viral panel pending -Titrate O2 to maintain SPO2 89%-93% - OSA -CPAP per respiratory  Weakness/stroke like symptom/metastatic or primary neoplasm brain  - CT shows patchy vasogenic edema with possible multi focal metastatic disease vs infection -Decadron 4 mg  q 6hr -Obtain CT abdomen pelvis with and without contrast evaluate for primary  Elevated troponin -Most likely demand ischemia continue to trend troponins  -CT angiogram negative for PE -Echocardiogram showed mild diastolic CHF  Chronic diastolic CHF -Continue Lasix 40 mg IV   BID -Strict in and out; since admission-1.4 L -Daily A.m. Weight; admission weight= 129.8 kg       Hypokalemia -K-Dur 40 mEq  BID -K-Dur 60 mEq 1 -Check magnesium and potassium in a.m.  Tobacco Abuse -Nicotine patch  Insomnia -Ambien 5 mg    Code Status: FULL Family Communication: no family present at time of exam Disposition Plan: Diagnosis of neoplasm    Consultants: 2/11 CXR;Borderline cardiomegaly and bilateral interstitial thickening, increased when compared to the prior chest radiograph.  -Mild congestive heart failure and interstitial edema is suspected 2/11 CT head without contrast;Patchy vasogenic edema, usually seen with multi focal metastatic disease or infection. -Multi focal cortical edema suspicious for superimposed infarcts.    Procedure/Significant Events: 2/11 CXR;Borderline cardiomegaly and bilateral interstitial thickening, increased when compared to the prior chest radiograph.  -Mild congestive heart failure and interstitial edema is suspected 2/11 CT head without contrast;Patchy vasogenic edema, usually seen with multi focal metastatic disease or infection. -Multi focal cortical edema suspicious for superimposed infarcts. 2/11 CT angiogram chest PE protocol;No central pulmonary embolus. -Cardiac enlargement with hazy parenchymal opacities suggesting congestive failure with edema. 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  2/12 CT abdomen pelvis with and without contrast pending;  2/12 2D Echocardiogram; - Left ventricle: mild LVH. -LVEF=  60% to 65%. -(grade 1 diastolic dysfunction).    Culture 2/12 blood pending 2/12 urine pending 2/12 sputum pending 2/12 respiratory viral panel pending  Antibiotics: Levofloxacin 2/11>>  DVT prophylaxis: Lovenox   Devices    LINES / TUBES:       Continuous Infusions:   Objective: VITAL SIGNS: Temp: 98.3 F (36.8 C) (02/12 2014) Temp Source: Oral (02/12 2014) BP: 144/59 mmHg (02/12 2014) Pulse Rate: 83 (02/12 2014) SPO2; FIO2:   Intake/Output Summary (Last 24 hours) at 10/24/14 2058 Last data filed at 10/24/14 2044  Gross per 24 hour  Intake      0 ml  Output   1325 ml  Net  -1325 ml     Exam: General: A/O 4, moderate respiratory distress,  Lungs: diffuse poor air movement, diffuse expiratory wheezing  Cardiovascular: Regular rate and rhythm without murmur gallop or rub normal S1 and S2 Abdomen: Morbidly obese, Nontender, nondistended, soft, bowel sounds positive, no rebound, no ascites, no appreciable mass Extremities: No significant cyanosis, clubbing. Bilateral lower extremity edema Rt>> Lt (2+  Data Reviewed: Basic Metabolic Panel:  Recent Labs Lab 10/23/14 1542 10/23/14 1612 10/24/14 0940  NA 142 142 139  K 3.5 3.1* 3.0*  CL 101 98 103  CO2 29  --  26  GLUCOSE 117* 113* 153*  BUN 14 16 15   CREATININE 0.78 0.70 0.84  CALCIUM 9.3  --  8.9  MG  --   --  1.9   Liver Function Tests:  Recent Labs Lab 10/23/14 1542 10/24/14 0940  AST 57* 77*  ALT 55* 76*  ALKPHOS 72 66  BILITOT 0.5 0.5  PROT 7.0 6.6  ALBUMIN 3.5 3.3*   No results for input(s): LIPASE, AMYLASE in the last 168 hours. No results for input(s): AMMONIA in the last 168 hours. CBC:  Recent Labs Lab 10/23/14 1542 10/23/14 1612 10/24/14 0940  WBC 13.5*  --  8.3  NEUTROABS 12.1*  --   --   HGB 14.9 17.0* 14.3  HCT 45.4 50.0* 43.7  MCV 88.8  --  89.7  PLT 197  --  179   Cardiac Enzymes:  Recent Labs Lab 10/23/14 1625 10/23/14 2115 10/24/14 0323 10/24/14 0810  TROPONINI 0.49* 0.96* 1.32* 1.11*   BNP (last 3 results)  Recent Labs  10/23/14 1542  BNP 95.2    ProBNP (last 3 results) No results for input(s): PROBNP in the last 8760 hours.  CBG: No results for input(s): GLUCAP in the last 168  hours.  Recent Results (from the past 240 hour(s))  MRSA PCR Screening     Status: None   Collection Time: 10/24/14  6:41 AM  Result Value Ref Range Status   MRSA by PCR NEGATIVE NEGATIVE Final    Comment:        The GeneXpert MRSA Assay (FDA approved for NASAL specimens only), is one component of a comprehensive MRSA colonization surveillance program. It is not intended to diagnose MRSA infection nor to guide or monitor treatment for MRSA infections.      Studies:  Recent x-ray studies have been reviewed in detail by the Attending Physician  Scheduled Meds:  Scheduled Meds: . aspirin EC  81 mg Oral Daily  . calcium-vitamin D  1 tablet Oral Q breakfast  . enoxaparin (LOVENOX) injection  40 mg Subcutaneous Q24H  . furosemide  40 mg Intravenous BID  . ipratropium-albuterol  3 mL Nebulization Q4H  . levETIRAcetam  500 mg Oral BID  . levofloxacin (LEVAQUIN) IV  750 mg Intravenous Q24H  . methylPREDNISolone (SOLU-MEDROL) injection  60 mg Intravenous Q6H  . PARoxetine  20 mg Oral Daily  . potassium chloride  40 mEq  Oral BID  . simvastatin  40 mg Oral q1800    Time spent on care of this patient: 40 mins   Allie Bossier [facility]  [facility] Office  [phone] Pager - [phone]  On-Call/Text Page:      [email or web address]      password [email or web address]  If 7PM-7AM, please contact night-coverage [email or web address] Password [facility] 10/24/2014, 8:58 PM   LOS: 1 day   Care during the described time interval was provided by me .  I have reviewed this patient's available data, including medical history, events of note, physical examination, radiology studies and test results as part of my evaluation  Dia Crawford, MD [phone] Pager

## 2014-10-24 NOTE — Consult Note (Signed)
CARDIOLOGY CONSULT NOTE  Patient ID: Caitlin Carter MRN: [id number] DOB/AGE: [birth year] 69 y.o.  Admit date: 10/23/2014 Primary Cardiologist: New Reason for Consultation: Elevated troponin  HPI: 69 yo with history of smoking, HTN, and hyperlipidemia presented to the ER on 2/11 with right arm weakness as well as dyspnea and cough.  The weakness was worked up with a head CT that was suggestive of multiple brains mets.  This was confirmed by brain MRI.  Primary is currently unknown.   Additionally was admitted with dyspnea for the last 2 weeks.  She has been short of breath with moderate exertion.  In the ER, she was noted to be hypoxic and was put on non-rebreather mask.  Currently on Venturi mask. She remains short of breath when she moves around.  She had 1 dose of Lasix yesterday and says she felt better after she got it.  She had CTA chest that showed possible pulmonary edema along with coronary calcification, no PE, mediastinal lymphadenopathy that may be due to mets.  She has had no chest pain.  No prior history of coronary disease.  Due to dyspnea, she had troponin drawn at admission.  It was elevated at 0.49 => 0.96 => 1.32 => 1.11.  BNP was only 95.   Review of systems complete and found to be negative unless listed above in HPI  Past Medical History: 1. OSA: CPAP 2. Obesity 3. HTN 4. Hyperlipidemia 5. Depression 6. Active smoker 7. Suspected metastatic cancer  Family History  Problem Relation Age of Onset  . CAD      History   Social History  . Marital Status: Widowed    Spouse Name: N/A  . Number of Children: N/A  . Years of Education: N/A   Occupational History  . Not on file.   Social History Main Topics  . Smoking status: Heavy Tobacco Smoker  . Smokeless tobacco: Not on file     Comment: since age 69  . Alcohol Use: Not on file  . Drug Use: Not on file  . Sexual Activity: Not on file   Other Topics Concern  . Not on file   Social History Narrative     Prescriptions prior to admission  Medication Sig Dispense Refill Last Dose  . aspirin 81 MG tablet Take 81 mg by mouth daily.   10/23/2014 at Unknown time  . calcium-vitamin D (OSCAL WITH D) 500-200 MG-UNIT per tablet Take 1 tablet by mouth daily with breakfast.   10/23/2014 at Unknown time  . Garlic 10 MG CAPS Take 10 mg elemental calcium/kg/hr by mouth daily.   10/23/2014 at Unknown time  . lisinopril-hydrochlorothiazide (PRINZIDE,ZESTORETIC) 20-25 MG per tablet Take 1 tablet by mouth daily.   10/23/2014 at Unknown time  . Melatonin 10 MG CAPS Take 10 mg by mouth at bedtime.   10/22/2014 at Unknown time  . Omega-3 Fatty Acids (FISH OIL) 1000 MG CAPS Take 1,000 mg by mouth daily.   10/23/2014 at Unknown time  . PARoxetine (PAXIL) 20 MG tablet Take 20 mg by mouth daily.   10/22/2014 at Unknown time  . simvastatin (ZOCOR) 40 MG tablet Take 40 mg by mouth daily.   10/22/2014 at Unknown time    Physical exam Blood pressure 131/62, pulse 86, temperature 97.6 F (36.4 C), temperature source Oral, resp. rate 17, height 5\' 6"  (1.676 m), weight 286 lb 2.5 oz (129.8 kg), SpO2 98 %. General: NAD, obese Neck: JVP 10-12 cm, no thyromegaly or thyroid nodule.  Lungs: Slight crackles at bases bilaterally.  CV: Nondisplaced PMI.  Heart mildly tachy, regular S1/S2, no S3/S4, no murmur.  1+ ankle edema.  No carotid bruit.  Normal pedal pulses.  Abdomen: Soft, nontender, no hepatosplenomegaly, no distention.  Skin: Intact without lesions or rashes.  Neurologic: Alert and oriented x 3.  Psych: Normal affect. Extremities: No clubbing or cyanosis.  HEENT: Normal.   Labs:   Lab Results  Component Value Date   WBC 8.3 10/24/2014   HGB 14.3 10/24/2014   HCT 43.7 10/24/2014   MCV 89.7 10/24/2014   PLT 179 10/24/2014    Recent Labs Lab 10/24/14 0940  NA 139  K 3.0*  CL 103  CO2 26  BUN 15  CREATININE 0.84  CALCIUM 8.9  PROT 6.6  BILITOT 0.5  ALKPHOS 66  ALT 76*  AST 77*  GLUCOSE 153*   Lab  Results  Component Value Date   TROPONINI 1.11* 10/24/2014     Radiology: - MRI head: Suggestive of multiple mets - CTA chest: No PE, possible pulmonary edema, coronary calcification, mediastinal lymphadenopathy concerning for metastatic disease.   EKG: NSR, normal  ASSESSMENT AND PLAN: 69 yo with history of smoking, HTN, and hyperlipidemia presented to the ER on 2/11 with right arm weakness as well as dyspnea and cough.  She was found to have probable brain mets as well as mildly elevated troponin.  1. 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  She also had possible mediastinal lymphadenopathy but no definite lung primary.  Workup is ongoing to find primary site.  2. CHF: Suspect CHF with volume overload on exam.  Pulmonary edema likely on chest CT.  BNP not elevated, but patient is obese so may be falsely low.  She remains hypoxic on oxygen by Venturi mask.  - Start Lasix 40 mg IV bid and replete K.   - Echo today.  3. Smoking: Long-time smoker.  Possible COPD.  I do not think that COPD explains the extent of her dyspnea/hypoxemia.  This is new over the last 2 wks.  4. Elevated troponin: No chest pain but mild troponin elevation.  She has been markedly hypoxic and is volume overloaded on exam.  No PE.  It is possible that this is demand ischemia from CHF/volume overload and hypoxemia.  However, she does have coronary calcification on CT and RFs for CAD.  In the absence of chest pain and given the brain mets, I would avoid heparin gtt.  As above, will diurese and will get echo.  Further ischemic workup will depend on her further clinical course, cancer workup, and results of echo.  Will continue ASA 81 and statin.  Loralie Champagne 10/24/2014 3:44 PM

## 2014-10-24 NOTE — Progress Notes (Signed)
*  PRELIMINARY RESULTS* Vascular Ultrasound Carotid Duplex (Doppler) has been completed.   Findings suggest 1-39% internal carotid artery stenosis bilaterally. Vertebral arteries are patent with antegrade flow.  10/24/2014 11:40 AM Maudry Mayhew, RVT, RDCS, RDMS

## 2014-10-24 NOTE — Progress Notes (Signed)
Pt very anxious and agitated, pulling at IV and telemetry cords, removing clothing and trying to climb out of bed. Notified K.Kirby (o/c) orders received and completed. ABG results called to [facility] along with elevated Troponin

## 2014-10-24 NOTE — Progress Notes (Signed)
Nutrition Brief Note  Patient identified on the Malnutrition Screening Tool (MST) Report.   Wt Readings from Last 15 Encounters:  10/23/14 286 lb 2.5 oz (129.8 kg)    Body mass index is 46.21 kg/(m^2). Patient meets criteria for class 3, extreme/morbid obesity based on current BMI.   Current diet order is heart healthy. Good oral intake PTA. Labs and medications reviewed.   No nutrition interventions warranted at this time. If nutrition issues arise, please consult RD.   Molli Barrows, RD, LDN, [place] Pager [phone] After Hours Pager [phone]

## 2014-10-24 NOTE — Progress Notes (Signed)
Subjective: Awake, pleasant, notes difficulty sleeping overnight. Patient had MRI completed overnight showing findings consistent with metastatic disease. Patient was unaware of these findings, spent time discussing findings and future workup with patient.   Patient is a smoker and has a strong family history of lung cancer.   Objective: Current vital signs: BP 125/48 mmHg  Pulse 87  Temp(Src) 97.5 F (36.4 C) (Oral)  Resp 17  Ht 5\' 6"  (1.676 m)  Wt 129.8 kg (286 lb 2.5 oz)  BMI 46.21 kg/m2  SpO2 93% Vital signs in last 24 hours: Temp:  [97.5 F (36.4 C)-99.2 F (37.3 C)] 97.5 F (36.4 C) (02/12 0744) Pulse Rate:  [87-111] 87 (02/12 0400) Resp:  [15-32] 17 (02/12 0400) BP: (106-134)/(48-97) 125/48 mmHg (02/12 0400) SpO2:  [83 %-99 %] 93 % (02/12 0857) FiO2 (%):  [40 %-50 %] 40 % (02/12 0857) Weight:  [129.8 kg (286 lb 2.5 oz)] 129.8 kg (286 lb 2.5 oz) (02/11 2032)  Intake/Output from previous day: 02/11 0701 - 02/12 0700 In: -  Out: 550 [Urine:550] Intake/Output this shift:   Nutritional status: Diet Heart  Neurologic Exam: Mental Status: Alert, oriented, thought content appropriate. Speech fluent without evidence of aphasia.  Cranial Nerves: II: pupils equal, round, reactive to light and accommodation III,IV, VI: left ptosis, extra-ocular motions intact bilaterally V,VII: smile symmetric, facial light touch sensation normal bilaterally  Motor: Right :Upper extremity 5/5Left: Upper extremity 5-/5 with pronator drift Lower extremity 5/5Lower extremity 5/5 Tone and bulk:normal tone throughout; no atrophy noted Sensory:  light touch intact throughout, bilaterally Lab Results: Basic Metabolic Panel:  Recent Labs Lab 10/23/14 1542 10/23/14 1612  NA 142 142  K 3.5 3.1*  CL 101 98  CO2 29  --   GLUCOSE 117* 113*  BUN 14 16  CREATININE 0.78 0.70   CALCIUM 9.3  --     Liver Function Tests:  Recent Labs Lab 10/23/14 1542  AST 57*  ALT 55*  ALKPHOS 72  BILITOT 0.5  PROT 7.0  ALBUMIN 3.5   No results for input(s): LIPASE, AMYLASE in the last 168 hours. No results for input(s): AMMONIA in the last 168 hours.  CBC:  Recent Labs Lab 10/23/14 1542 10/23/14 1612  WBC 13.5*  --   NEUTROABS 12.1*  --   HGB 14.9 17.0*  HCT 45.4 50.0*  MCV 88.8  --   PLT 197  --     Cardiac Enzymes:  Recent Labs Lab 10/23/14 1625 10/23/14 2115 10/24/14 0323 10/24/14 0810  TROPONINI 0.49* 0.96* 1.32* 1.11*    Lipid Panel:  Recent Labs Lab 10/24/14 0323  CHOL 160  TRIG 170*  HDL 38*  CHOLHDL 4.2  VLDL 34  LDLCALC 88    CBG: No results for input(s): GLUCAP in the last 168 hours.  Microbiology: Results for orders placed or performed during the hospital encounter of 10/23/14  MRSA PCR Screening     Status: None   Collection Time: 10/24/14  6:41 AM  Result Value Ref Range Status   MRSA by PCR NEGATIVE NEGATIVE Final    Comment:        The GeneXpert MRSA Assay (FDA approved for NASAL specimens only), is one component of a comprehensive MRSA colonization surveillance program. It is not intended to diagnose MRSA infection nor to guide or monitor treatment for MRSA infections.     Coagulation Studies:  Recent Labs  10/23/14 1542  LABPROT 13.2  INR 0.99    Imaging: Dg Chest 2 View  10/23/2014   CLINICAL DATA:  Pt states that she is very SOB and has been congested for the past week now, wheezing.  EXAM: CHEST  2 VIEW  COMPARISON:  01/09/2007  FINDINGS: Cardiac silhouette borderline enlarged. No mediastinal or hilar masses or evidence of adenopathy.  There is bilateral interstitial thickening. No areas of lung consolidation is seen. No pleural effusion or pneumothorax.  Bony thorax is diffusely demineralized but grossly intact.  IMPRESSION: Borderline cardiomegaly and bilateral interstitial thickening.  Interstitial thickening may all be chronic, but has increased when compared to the prior chest radiograph. Mild congestive heart failure and interstitial edema is suspected.   Electronically Signed   By: Lajean Manes M.D.   On: 10/23/2014 16:50   Ct Head Wo Contrast  10/23/2014   CLINICAL DATA:  Right upper extremity weakness.  EXAM: CT HEAD WITHOUT CONTRAST  TECHNIQUE: Contiguous axial images were obtained from the base of the skull through the vertex without intravenous contrast.  COMPARISON:  None.  FINDINGS: Skull and Sinuses:No evidence of bone metastasis. The mastoids and sinuses are clear.  Orbits: No acute abnormality.  Brain: There is diffuse abnormality of the cerebral hemispheres with asymmetric white matter low density that appears to have mass effect, effacing associated sulci. This is confluent throughout the right centrum semiovale and subcortical white matter, more patchy on the left. The appearance favors vasogenic edema as seen with tumor or infection. There are also areas of small cortical low-density in the lateral right temporal lobe, parasagittal left frontal lobe, and left parietal lobe. There is no midline shift, hydrocephalus, or acute hemorrhage.  These results were called by telephone at the time of interpretation on 10/23/2014 at 4:59 pm to Dr. Ernestina Patches , who verbally acknowledged these results.  IMPRESSION: Patchy vasogenic edema, usually seen with multi focal metastatic disease or infection. There is also multi focal cortical edema suspicious for superimposed infarcts. Brain MRI with contrast is recommended.   Electronically Signed   By: Monte Fantasia M.D.   On: 10/23/2014 17:04   Ct Angio Chest Pe W/cm &/or Wo Cm  10/24/2014   ADDENDUM REPORT: 10/24/2014 01:23  ADDENDUM: There is lymphadenopathy in the mediastinum involving the pretracheal, subcarinal, and left aortopulmonic window regions. Possible adenopathy in the left hilum. Appearance worrisome for metastases.    Electronically Signed   By: Lucienne Capers M.D.   On: 10/24/2014 01:23   10/24/2014   CLINICAL DATA:  Shortness of breath for 2 days, worse this morning.  EXAM: CT ANGIOGRAPHY CHEST WITH CONTRAST  TECHNIQUE: Multidetector CT imaging of the chest was performed using the standard protocol during bolus administration of intravenous contrast. Multiplanar CT image reconstructions and MIPs were obtained to evaluate the vascular anatomy.  CONTRAST:  163mL OMNIPAQUE IOHEXOL 350 MG/ML SOLN  COMPARISON:  None.  FINDINGS: Technically adequate study with moderately good opacification of the central and proximal segmental pulmonary arteries. Contrast bolus is somewhat limited and there is motion artifact, which limits evaluation of peripheral pulmonary arteries. No filling defects demonstrated suggesting no evidence of significant central pulmonary embolus.  Mild cardiac enlargement. Normal caliber thoracic aorta. Aortic calcification. Great vessel origins appear patent. Coronary artery calcifications. Esophagus is decompressed. No significant lymphadenopathy in the chest.  Evaluation of lungs is limited due to respiratory motion artifact. There appears to be patchy airspace disease in the lungs which may indicate edema or multifocal pneumonia. No pleural effusions. No pneumothorax. Airways appear patent although the lower trachea is somewhat narrowed.  Included portions  of the upper abdominal organs are grossly unremarkable. Degenerative changes throughout the spine. No destructive bone lesions.  Review of the MIP images confirms the above findings.  IMPRESSION: No evidence of significant central pulmonary embolus. Peripheral vessels are not well demonstrated. Cardiac enlargement with hazy parenchymal opacities suggesting congestive failure with edema.  Electronically Signed: By: Lucienne Capers M.D. On: 10/23/2014 19:32   Mr Jeri Cos UL Contrast  10/24/2014   CLINICAL DATA:  Intermittent LEFT upper extremity thickening  and seizure like symptoms that spontaneously resolve. Follow-up abnormal CT head.  EXAM: MRI HEAD WITHOUT AND WITH CONTRAST  TECHNIQUE: Multiplanar, multiecho pulse sequences of the brain and surrounding structures were obtained without and with intravenous contrast.  CONTRAST:  36mL MULTIHANCE GADOBENATE DIMEGLUMINE 529 MG/ML IV SOLN  COMPARISON:  CT head October 23, 2014  FINDINGS: Moderately motion degraded examination.  At least 6 inferred tentorial and greater than 20 supratentorial rim enhancing lesions at the gray-white matter junction, largest measuring up to 14 mm. 7 mm lesion in RIGHT thalamus. 10 mm lesion in LEFT posterior pons at the level of the superior peduncle. Many of the lesions show slight reduced diffusion. Limited assessment for blood products due to severely motion degraded susceptibility weighted imaging. Lesion show no intrinsic T1 shortening. Lesions are T2 bright with extends is associated vasogenic edema. 2 mm RIGHT to LEFT midline shift.  No hydrocephalus. No abnormal extra-axial fluid collections nor axial masses. No abnormal extra-axial enhancement the motion degrades sensitivity. Normal major intracranial vascular flow voids seen at the skull base.  Ocular globes and orbital contents are unremarkable. Paranasal sinuses and mastoid air cells are well aerated.  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 Consider correlation with CSF sampling, and possible PET-CT to assess for primary, as clinically indicated.   Electronically Signed   By: Elon Alas   On: 10/24/2014 01:02    Medications:  Scheduled: . aspirin EC  81 mg Oral Daily  . calcium-vitamin D  1 tablet Oral Q breakfast  . enoxaparin (LOVENOX) injection  40 mg Subcutaneous Q24H  . ipratropium-albuterol  3 mL Nebulization Q4H  . levETIRAcetam  500 mg Oral BID  . levofloxacin (LEVAQUIN) IV  750  mg Intravenous Q24H  . methylPREDNISolone (SOLU-MEDROL) injection  60 mg Intravenous Q6H  . PARoxetine  20 mg Oral Daily  . simvastatin  40 mg Oral q1800    Assessment/Plan: 69 y.o. female presenting with episodes of LUE numbness and stiffening. Current neurological examination is only significant for a left ptosis and mild left upper extremity drift.MRI brain consistent with metastatic disease. Primary source unclear.   -continue keppra 500mg  BID -continue decadron 4mg  IV q6hrs -consider oncology consultation -will need further workup for potential source of metastatic disease. Consider pan CT scan. Of note she does have a history of smoking and a strong family history of lung cancer -will consider LP if indicated to determine primary source if CT imaging unremarkable. Due to body habitus discussed with radiology and will have LP done under fluoroscopy    LOS: 1 day   Jim Like, DO [facility] [phone]  If 7pm- 7am, please page neurology on call as listed in [facility]. 10/24/2014  9:49 AM

## 2014-10-25 ENCOUNTER — Inpatient Hospital Stay (HOSPITAL_COMMUNITY): Payer: Medicare Other

## 2014-10-25 ENCOUNTER — Encounter (HOSPITAL_COMMUNITY): Payer: Self-pay | Admitting: Radiology

## 2014-10-25 DIAGNOSIS — D499 Neoplasm of unspecified behavior of unspecified site: Secondary | ICD-10-CM

## 2014-10-25 DIAGNOSIS — J438 Other emphysema: Secondary | ICD-10-CM

## 2014-10-25 DIAGNOSIS — D496 Neoplasm of unspecified behavior of brain: Secondary | ICD-10-CM | POA: Diagnosis present

## 2014-10-25 LAB — COMPREHENSIVE METABOLIC PANEL
ALK PHOS: 64 U/L (ref 39–117)
ALT: 71 U/L — ABNORMAL HIGH (ref 0–35)
AST: 114 U/L — ABNORMAL HIGH (ref 0–37)
Albumin: 3.5 g/dL (ref 3.5–5.2)
Anion gap: 8 (ref 5–15)
BILIRUBIN TOTAL: 0.4 mg/dL (ref 0.3–1.2)
BUN: 17 mg/dL (ref 6–23)
CHLORIDE: 99 mmol/L (ref 96–112)
CO2: 33 mmol/L — AB (ref 19–32)
CREATININE: 0.83 mg/dL (ref 0.50–1.10)
Calcium: 9.3 mg/dL (ref 8.4–10.5)
GFR calc non Af Amer: 71 mL/min — ABNORMAL LOW (ref >90)
GFR, EST AFRICAN AMERICAN: 82 mL/min — AB (ref >90)
Glucose, Bld: 116 mg/dL — ABNORMAL HIGH (ref 70–99)
POTASSIUM: 3.7 mmol/L (ref 3.5–5.1)
Sodium: 140 mmol/L (ref 135–145)
Total Protein: 7.1 g/dL (ref 6.0–8.3)

## 2014-10-25 LAB — TROPONIN I
TROPONIN I: 0.66 ng/mL — AB (ref <0.031)
Troponin I: 0.55 ng/mL (ref <0.031)
Troponin I: 0.41 ng/mL — ABNORMAL HIGH (ref <0.031)

## 2014-10-25 LAB — EXPECTORATED SPUTUM ASSESSMENT W GRAM STAIN, RFLX TO RESP C

## 2014-10-25 LAB — EXPECTORATED SPUTUM ASSESSMENT W REFEX TO RESP CULTURE

## 2014-10-25 LAB — MAGNESIUM: MAGNESIUM: 2 mg/dL (ref 1.5–2.5)

## 2014-10-25 MED ORDER — IOHEXOL 300 MG/ML  SOLN
100.0000 mL | Freq: Once | INTRAMUSCULAR | Status: AC | PRN
  Administered 2014-10-25: 100 mL via INTRAVENOUS

## 2014-10-25 MED ORDER — WHITE PETROLATUM GEL
Status: AC
  Administered 2014-10-25: 0.2
  Filled 2014-10-25: qty 1

## 2014-10-25 MED ORDER — POTASSIUM CHLORIDE CRYS ER 20 MEQ PO TBCR
20.0000 meq | EXTENDED_RELEASE_TABLET | Freq: Once | ORAL | Status: AC
  Administered 2014-10-25: 20 meq via ORAL
  Filled 2014-10-25: qty 1

## 2014-10-25 NOTE — Progress Notes (Signed)
SLP Cancellation Note  Patient Details Name: [name] MRN: [id number] DOB: 06/26/1946   Cancelled treatment:        Pt currently unavailable due to in-room procedure. Will continue efforts.  [place]. [place], [facility], [place]  Shonna Chock 10/25/2014, 11:18 AM

## 2014-10-25 NOTE — Progress Notes (Signed)
Pt not available for CPAP set-up at this time. I will return later.

## 2014-10-25 NOTE — Progress Notes (Addendum)
Subjective:  No complaints of shortness of breath or chest pain today.  Troponin trend has been flat  Objective:  Vital Signs in the last 24 hours: BP 133/48 mmHg  Pulse 99  Temp(Src) 98.3 F (36.8 C) (Oral)  Resp 19  Ht 5\' 6"  (1.676 m)  Wt 128.9 kg (284 lb 2.8 oz)  BMI 45.89 kg/m2  SpO2 97%  Physical Exam: Plethoric-appearing obese female in no acute distress Lungs:  Reduced breath sounds with rhonchi  Cardiac:  Regular rhythm, normal S1 and S2, no S3 Extremities:  No edema present  Intake/Output from previous day: 02/12 0701 - 02/13 0700 In: 1110 [P.O.:960; IV Piggyback:150] Out: 1250 [Urine:1250]  Weight Filed Weights   10/23/14 2032 10/25/14 0415  Weight: 129.8 kg (286 lb 2.5 oz) 128.9 kg (284 lb 2.8 oz)    Lab Results: Basic Metabolic Panel:  Recent Labs  10/24/14 0940 10/25/14 0400  NA 139 140  K 3.0* 3.7  CL 103 99  CO2 26 33*  GLUCOSE 153* 116*  BUN 15 17  CREATININE 0.84 0.83   CBC:  Recent Labs  10/23/14 1542  10/24/14 0940 10/24/14 2245  WBC 13.5*  --  8.3 15.8*  NEUTROABS 12.1*  --   --  13.9*  HGB 14.9  < > 14.3 15.1*  HCT 45.4  < > 43.7 46.3*  MCV 88.8  --  89.7 91.7  PLT 197  --  179 199  < > = values in this interval not displayed. Cardiac Enzymes: Troponin (Point of Care Test)  Recent Labs  10/23/14 1610  TROPIPOC 0.36*   Cardiac Panel (last 3 results)  Recent Labs  10/24/14 0810 10/24/14 2245 10/25/14 0400  TROPONINI 1.11* 0.66* 0.55*    Telemetry: Sinus rhythm  Assessment/Plan:  1.  Normal left ventricular function echocardiogram 2.  Dyspnea could have been due to diastolic dysfunction as she has normal LV function.  It is possible in light of her BNP being low that the dyspnea was noncardiac and due to her underlying lung disease and smoking also.   3.  Flat troponin curve without clinical presentation consistent with ischemia 4.  Coronary calcification noted.  Recommendations:  At this point in light of  her brain metastases I would concentrate to workup on that first.  She should be managed medically unless she had a clinical ischemic picture in light of her other comorbidities.  Thus I would continue aspirin and statins.  We'll continue to follow with you.     Kerry Hough  MD [facility]  10/25/2014, 11:48 AM

## 2014-10-25 NOTE — Progress Notes (Signed)
Subjective: Awake, no acute problems. CT scan of pelvis shows no primary tumor or metastatic disease.   Patient is a smoker and has a strong family history of lung cancer.   Objective: Current vital signs: BP 148/82 mmHg  Pulse 86  Temp(Src) 98.2 F (36.8 C) (Oral)  Resp 17  Ht 5\' 6"  (1.676 m)  Wt 128.9 kg (284 lb 2.8 oz)  BMI 45.89 kg/m2  SpO2 92% Vital signs in last 24 hours: Temp:  [97.6 F (36.4 C)-98.7 F (37.1 C)] 98.2 F (36.8 C) (02/13 0819) Pulse Rate:  [79-91] 86 (02/13 0819) Resp:  [10-27] 17 (02/13 0819) BP: (113-148)/(47-82) 148/82 mmHg (02/13 0819) SpO2:  [92 %-98 %] 92 % (02/13 0819) FiO2 (%):  [40 %] 40 % (02/12 1533) Weight:  [128.9 kg (284 lb 2.8 oz)] 128.9 kg (284 lb 2.8 oz) (02/13 0415)  Intake/Output from previous day: 02/12 0701 - 02/13 0700 In: 1110 [P.O.:960; IV Piggyback:150] Out: 1250 [Urine:1250] Intake/Output this shift: Total I/O In: 120 [P.O.:120] Out: -  Nutritional status: Diet Heart  Neurologic Exam: Mental Status: Alert, oriented, thought content appropriate. Speech fluent without evidence of aphasia.  Cranial Nerves: II: pupils equal, round, reactive to light and accommodation III,IV, VI: left ptosis, extra-ocular motions intact bilaterally V,VII: smile symmetric, facial light touch sensation normal bilaterally  Motor: Right :Upper extremity 5/5Left: Upper extremity 5-/5 with pronator drift Lower extremity 5/5Lower extremity 5/5 Tone and bulk:normal tone throughout; no atrophy noted Sensory:  light touch intact throughout, bilaterally Lab Results: Basic Metabolic Panel:  Recent Labs Lab 10/23/14 1542 10/23/14 1612 10/24/14 0940 10/25/14 0400  NA 142 142 139 140  K 3.5 3.1* 3.0* 3.7  CL 101 98 103 99  CO2 29  --  26 33*  GLUCOSE 117* 113* 153* 116*  BUN 14 16 15 17   CREATININE 0.78 0.70 0.84 0.83   CALCIUM 9.3  --  8.9 9.3  MG  --   --  1.9 2.0    Liver Function Tests:  Recent Labs Lab 10/23/14 1542 10/24/14 0940 10/25/14 0400  AST 57* 77* 114*  ALT 55* 76* 71*  ALKPHOS 72 66 64  BILITOT 0.5 0.5 0.4  PROT 7.0 6.6 7.1  ALBUMIN 3.5 3.3* 3.5   No results for input(s): LIPASE, AMYLASE in the last 168 hours. No results for input(s): AMMONIA in the last 168 hours.  CBC:  Recent Labs Lab 10/23/14 1542 10/23/14 1612 10/24/14 0940 10/24/14 2245  WBC 13.5*  --  8.3 15.8*  NEUTROABS 12.1*  --   --  13.9*  HGB 14.9 17.0* 14.3 15.1*  HCT 45.4 50.0* 43.7 46.3*  MCV 88.8  --  89.7 91.7  PLT 197  --  179 199    Cardiac Enzymes:  Recent Labs Lab 10/23/14 2115 10/24/14 0323 10/24/14 0810 10/24/14 2245 10/25/14 0400  TROPONINI 0.96* 1.32* 1.11* 0.66* 0.55*    Lipid Panel:  Recent Labs Lab 10/24/14 0323  CHOL 160  TRIG 170*  HDL 38*  CHOLHDL 4.2  VLDL 34  LDLCALC 88    CBG: No results for input(s): GLUCAP in the last 168 hours.  Microbiology: Results for orders placed or performed during the hospital encounter of 10/23/14  MRSA PCR Screening     Status: None   Collection Time: 10/24/14  6:41 AM  Result Value Ref Range Status   MRSA by PCR NEGATIVE NEGATIVE Final    Comment:        The GeneXpert MRSA Assay (FDA approved for  NASAL specimens only), is one component of a comprehensive MRSA colonization surveillance program. It is not intended to diagnose MRSA infection nor to guide or monitor treatment for MRSA infections.   Culture, expectorated sputum-assessment     Status: None   Collection Time: 10/25/14 12:37 AM  Result Value Ref Range Status   Specimen Description INDUCED SPUTUM  Final   Special Requests NONE  Final   Sputum evaluation   Final    MICROSCOPIC FINDINGS SUGGEST THAT THIS SPECIMEN IS NOT REPRESENTATIVE OF LOWER RESPIRATORY SECRETIONS. PLEASE RECOLLECT. CALLED TO H.DILLON RN 0139 10/25/14 E.GADDY    Report Status 10/25/2014  FINAL  Final    Coagulation Studies:  Recent Labs  10/23/14 1542  LABPROT 13.2  INR 0.99    Imaging: Dg Chest 2 View  10/23/2014   CLINICAL DATA:  Pt states that she is very SOB and has been congested for the past week now, wheezing.  EXAM: CHEST  2 VIEW  COMPARISON:  01/09/2007  FINDINGS: Cardiac silhouette borderline enlarged. No mediastinal or hilar masses or evidence of adenopathy.  There is bilateral interstitial thickening. No areas of lung consolidation is seen. No pleural effusion or pneumothorax.  Bony thorax is diffusely demineralized but grossly intact.  IMPRESSION: Borderline cardiomegaly and bilateral interstitial thickening. Interstitial thickening may all be chronic, but has increased when compared to the prior chest radiograph. Mild congestive heart failure and interstitial edema is suspected.   Electronically Signed   By: Lajean Manes M.D.   On: 10/23/2014 16:50   Ct Head Wo Contrast  10/23/2014   CLINICAL DATA:  Right upper extremity weakness.  EXAM: CT HEAD WITHOUT CONTRAST  TECHNIQUE: Contiguous axial images were obtained from the base of the skull through the vertex without intravenous contrast.  COMPARISON:  None.  FINDINGS: Skull and Sinuses:No evidence of bone metastasis. The mastoids and sinuses are clear.  Orbits: No acute abnormality.  Brain: There is diffuse abnormality of the cerebral hemispheres with asymmetric white matter low density that appears to have mass effect, effacing associated sulci. This is confluent throughout the right centrum semiovale and subcortical white matter, more patchy on the left. The appearance favors vasogenic edema as seen with tumor or infection. There are also areas of small cortical low-density in the lateral right temporal lobe, parasagittal left frontal lobe, and left parietal lobe. There is no midline shift, hydrocephalus, or acute hemorrhage.  These results were called by telephone at the time of interpretation on 10/23/2014 at 4:59 pm  to Dr. Ernestina Patches , who verbally acknowledged these results.  IMPRESSION: Patchy vasogenic edema, usually seen with multi focal metastatic disease or infection. There is also multi focal cortical edema suspicious for superimposed infarcts. Brain MRI with contrast is recommended.   Electronically Signed   By: Monte Fantasia M.D.   On: 10/23/2014 17:04   Ct Angio Chest Pe W/cm &/or Wo Cm  10/24/2014   ADDENDUM REPORT: 10/24/2014 01:23  ADDENDUM: There is lymphadenopathy in the mediastinum involving the pretracheal, subcarinal, and left aortopulmonic window regions. Possible adenopathy in the left hilum. Appearance worrisome for metastases.   Electronically Signed   By: Lucienne Capers M.D.   On: 10/24/2014 01:23   10/24/2014   CLINICAL DATA:  Shortness of breath for 2 days, worse this morning.  EXAM: CT ANGIOGRAPHY CHEST WITH CONTRAST  TECHNIQUE: Multidetector CT imaging of the chest was performed using the standard protocol during bolus administration of intravenous contrast. Multiplanar CT image reconstructions and MIPs were obtained to  evaluate the vascular anatomy.  CONTRAST:  144mL OMNIPAQUE IOHEXOL 350 MG/ML SOLN  COMPARISON:  None.  FINDINGS: Technically adequate study with moderately good opacification of the central and proximal segmental pulmonary arteries. Contrast bolus is somewhat limited and there is motion artifact, which limits evaluation of peripheral pulmonary arteries. No filling defects demonstrated suggesting no evidence of significant central pulmonary embolus.  Mild cardiac enlargement. Normal caliber thoracic aorta. Aortic calcification. Great vessel origins appear patent. Coronary artery calcifications. Esophagus is decompressed. No significant lymphadenopathy in the chest.  Evaluation of lungs is limited due to respiratory motion artifact. There appears to be patchy airspace disease in the lungs which may indicate edema or multifocal pneumonia. No pleural effusions. No pneumothorax.  Airways appear patent although the lower trachea is somewhat narrowed.  Included portions of the upper abdominal organs are grossly unremarkable. Degenerative changes throughout the spine. No destructive bone lesions.  Review of the MIP images confirms the above findings.  IMPRESSION: No evidence of significant central pulmonary embolus. Peripheral vessels are not well demonstrated. Cardiac enlargement with hazy parenchymal opacities suggesting congestive failure with edema.  Electronically Signed: By: Lucienne Capers M.D. On: 10/23/2014 19:32   Mr Jeri Cos GY Contrast  10/24/2014   CLINICAL DATA:  Intermittent LEFT upper extremity thickening and seizure like symptoms that spontaneously resolve. Follow-up abnormal CT head.  EXAM: MRI HEAD WITHOUT AND WITH CONTRAST  TECHNIQUE: Multiplanar, multiecho pulse sequences of the brain and surrounding structures were obtained without and with intravenous contrast.  CONTRAST:  58mL MULTIHANCE GADOBENATE DIMEGLUMINE 529 MG/ML IV SOLN  COMPARISON:  CT head October 23, 2014  FINDINGS: Moderately motion degraded examination.  At least 6 inferred tentorial and greater than 20 supratentorial rim enhancing lesions at the gray-white matter junction, largest measuring up to 14 mm. 7 mm lesion in RIGHT thalamus. 10 mm lesion in LEFT posterior pons at the level of the superior peduncle. Many of the lesions show slight reduced diffusion. Limited assessment for blood products due to severely motion degraded susceptibility weighted imaging. Lesion show no intrinsic T1 shortening. Lesions are T2 bright with extends is associated vasogenic edema. 2 mm RIGHT to LEFT midline shift.  No hydrocephalus. No abnormal extra-axial fluid collections nor axial masses. No abnormal extra-axial enhancement the motion degrades sensitivity. Normal major intracranial vascular flow voids seen at the skull base.  Ocular globes and orbital contents are unremarkable. Paranasal sinuses and mastoid air cells  are well aerated.  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 Consider correlation with CSF sampling, and possible PET-CT to assess for primary, as clinically indicated.   Electronically Signed   By: Elon Alas   On: 10/24/2014 01:02   Ct Abdomen Pelvis W Contrast  10/25/2014   CLINICAL DATA:  69 year old female with abnormal brain MRI suggesting metastatic disease. Subsequent encounter.  EXAM: CT ABDOMEN AND PELVIS WITH CONTRAST  TECHNIQUE: Multidetector CT imaging of the abdomen and pelvis was performed using the standard protocol following bolus administration of intravenous contrast.  CONTRAST:  140mL OMNIPAQUE IOHEXOL 300 MG/ML  SOLN  COMPARISON:  Chest CTA 10/23/14.  Brain MRI 10/23/14.  FINDINGS: Negative lung bases.   No acute osseous abnormality identified.  No pelvic free fluid. Negative uterus, adenexa, bladder, rectum. Severe Sigmoid diverticulosis, continues less pronounced to the splenic flexure. No active inflammation. Oral contrast at the hepatic flexure. Negative more proximal colon. No dilated  small bowel.  Negative stomach, duodenum, gallbladder, spleen, pancreas, adrenal glands. Negative kidneys; mild calcified atherosclerosis.  There is some hepatic steatosis.  Otherwise negative liver.  No free fluid. Aortoiliac calcified atherosclerosis noted. Major vascular structures are patent. No mass or lymphadenopathy.  IMPRESSION: 1. No primary tumor or metastatic disease identified in the abdomen or pelvis.  2.  Hepatic steatosis.  3.  Diverticulosis of the colon.   Electronically Signed   By: Genevie Ann M.D.   On: 10/25/2014 01:40    Medications:  Scheduled: . aspirin EC  81 mg Oral Daily  . calcium-vitamin D  1 tablet Oral Q breakfast  . dexamethasone  4 mg Intravenous 4 times per day  . enoxaparin (LOVENOX) injection  40 mg Subcutaneous Q24H  . furosemide  40 mg  Intravenous BID  . ipratropium-albuterol  3 mL Nebulization Q4H  . levETIRAcetam  500 mg Oral BID  . levofloxacin (LEVAQUIN) IV  750 mg Intravenous Q24H  . nicotine  21 mg Transdermal Daily  . PARoxetine  20 mg Oral Daily  . potassium chloride  40 mEq Oral BID  . simvastatin  40 mg Oral q1800    Assessment/Plan: 69 y.o. female presenting with episodes of LUE numbness and stiffening. Current neurological examination is only significant for a left ptosis and mild left upper extremity drift.MRI brain consistent with metastatic disease. Primary source unclear.   -continue keppra 500mg  BID -continue decadron 4mg  IV q6hrs -consider oncology consultation -CT scan of chest and pelvis did not find primary source -will plan for LP to search for source. Due to body habitus discussed with radiology and will have LP done under fluoroscopy    LOS: 2 days   Jim Like, DO [facility] [phone]  If 7pm- 7am, please page neurology on call as listed in [place]. 10/25/2014  8:58 AM

## 2014-10-25 NOTE — Progress Notes (Signed)
[place] TEAM 1 - Stepdown/ICU TEAM Progress Note  Caitlin Carter PYP:[id number] DOB: 01-12-1946 DOA: 10/23/2014 PCP: Antony Blackbird, MD  Admit HPI / Brief Narrative: Caitlin Carter is a 69 y.o. Female PMHx  depression, HTN, HLD, OSA on CPAP, obesity, Who comes in c/o b/l arm weakness- left > right. This same thing happened 2 weeks ago but resolved on its own. She also has developed worsening SOB and cough. No fever, no chills. Denies weight loss  In the ER, she was hypoxic, requiring 4 L O2 and then progressed to needing non-rebreather. She had a chest x ray that showed Mild congestive heart failure and interstitial edema is suspected. A CT scan of her head was done that showed vasogenic edema from possible infection vs mets Patient has been smoking since she was 69 years old. Wears Cpap at night only  Hospitalist were asked to admit for further work up of COPD/CHF and vasogenic brain edema- will place in SDU due to respiratory status    HPI/Subjective: 2/13 A/O 4, patient's WOB significantly decreased. Able to hold for conversation with only 2 L O2 via [place].    Assessment/Plan: COPD exacerbation in a smoker -Continue Solu-Medrol 60 mg Daily -Continue Levofloxacin x 10 days -Duoneb q 4 hr -Sputum pending -Respiratory viral panel pending -Titrate O2 to maintain SPO2 89%-93% - OSA -CPAP per respiratory  Weakness/stroke like symptom/metastatic or primary neoplasm brain  - CT shows patchy vasogenic edema with possible multi focal metastatic disease vs infection -Decadron 4 mg  q 6hr -CT abdomen pelvis; nondiagnostic for primary neoplasm will need LP. Already scheduled by neurology  Elevated troponin -Most likely demand ischemia; troponins trending down  -CT angiogram negative for PE -Echocardiogram showed mild diastolic CHF  Chronic diastolic CHF -Continue Lasix 40 mg IV  BID -Strict in and out; since admission-63ml -Daily A.m. Weight; admission weight= 129.8 kg          2/13 bed weight= 128.9 kg  Hypokalemia -K-Dur 40 mEq  BID -K-Dur 20 mEq 1 -Check magnesium and potassium in a.m.  Tobacco Abuse -Continue Nicotine patch  Insomnia -Ambien 5 mg    Code Status: FULL Family Communication: family present at time of exam Disposition Plan: Diagnosis of neoplasm    Consultants: 2/11 CXR;Borderline cardiomegaly and bilateral interstitial thickening, increased when compared to the prior chest radiograph.  -Mild congestive heart failure and interstitial edema is suspected 2/11 CT head without contrast;Patchy vasogenic edema, usually seen with multi focal metastatic disease or infection. -Multi focal cortical edema suspicious for superimposed infarcts.    Procedure/Significant Events: 2/11 CXR;Borderline cardiomegaly and bilateral interstitial thickening, increased when compared to the prior chest radiograph.  -Mild congestive heart failure and interstitial edema is suspected 2/11 CT head without contrast;Patchy vasogenic edema, usually seen with multi focal metastatic disease or infection. -Multi focal cortical edema suspicious for superimposed infarcts. 2/11 CT angiogram chest PE protocol;No central pulmonary embolus. -Cardiac enlargement with hazy parenchymal opacities suggesting congestive failure with edema. 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  2/12 CT abdomen pelvis with and without contrast pending;  2/12 2D Echocardiogram; - Left ventricle: mild LVH. -LVEF=  60% to 65%. -(grade 1 diastolic dysfunction). 2/13 CT abdomen pelvis with contrast;- No primary tumor or metastatic disease identified in the abdomen or pelvis.- Hepatic steatosis.- Diverticulosis of the colon.    Culture 2/12 blood pending  2/12 urine pending 2/12 sputum pending 2/12 respiratory viral panel  pending  Antibiotics: Levofloxacin 2/11>>   DVT prophylaxis: Lovenox   Devices    LINES / TUBES:      Continuous Infusions:   Objective: VITAL SIGNS: Temp: 98.9 F (37.2 C) (02/13 1700) Temp Source: Oral (02/13 1700) BP: 133/48 mmHg (02/13 1125) Pulse Rate: 99 (02/13 1126) SPO2; FIO2:   Intake/Output Summary (Last 24 hours) at 10/25/14 1808 Last data filed at 10/25/14 0905  Gross per 24 hour  Intake   1470 ml  Output   1250 ml  Net    220 ml     Exam: General: A/O 4, mild respiratory distress, mild WOB  Lungs: diffuse poor air movement but significantly improved from 2/12, diffuse expiratory wheezing significantly decreased from 2/12  Cardiovascular: Regular rate and rhythm without murmur gallop or rub normal S1 and S2 Abdomen: Morbidly obese, Nontender, nondistended, soft, bowel sounds positive, no rebound, no ascites, no appreciable mass Extremities: No significant cyanosis, clubbing. Bilateral lower extremity edema Rt>> Lt (2+  Data Reviewed: Basic Metabolic Panel:  Recent Labs Lab 10/23/14 1542 10/23/14 1612 10/24/14 0940 10/25/14 0400  NA 142 142 139 140  K 3.5 3.1* 3.0* 3.7  CL 101 98 103 99  CO2 29  --  26 33*  GLUCOSE 117* 113* 153* 116*  BUN 14 16 15 17   CREATININE 0.78 0.70 0.84 0.83  CALCIUM 9.3  --  8.9 9.3  MG  --   --  1.9 2.0   Liver Function Tests:  Recent Labs Lab 10/23/14 1542 10/24/14 0940 10/25/14 0400  AST 57* 77* 114*  ALT 55* 76* 71*  ALKPHOS 72 66 64  BILITOT 0.5 0.5 0.4  PROT 7.0 6.6 7.1  ALBUMIN 3.5 3.3* 3.5   No results for input(s): LIPASE, AMYLASE in the last 168 hours. No results for input(s): AMMONIA in the last 168 hours. CBC:  Recent Labs Lab 10/23/14 1542 10/23/14 1612 10/24/14 0940 10/24/14 2245  WBC 13.5*  --  8.3 15.8*  NEUTROABS 12.1*  --   --  13.9*  HGB 14.9 17.0* 14.3 15.1*  HCT 45.4 50.0* 43.7 46.3*  MCV 88.8  --  89.7 91.7  PLT 197  --  179 199   Cardiac Enzymes:  Recent  Labs Lab 10/24/14 0323 10/24/14 0810 10/24/14 2245 10/25/14 0400 10/25/14 1200  TROPONINI 1.32* 1.11* 0.66* 0.55* 0.41*   BNP (last 3 results)  Recent Labs  10/23/14 1542  BNP 95.2    ProBNP (last 3 results) No results for input(s): PROBNP in the last 8760 hours.  CBG: No results for input(s): GLUCAP in the last 168 hours.  Recent Results (from the past 240 hour(s))  MRSA PCR Screening     Status: None   Collection Time: 10/24/14  6:41 AM  Result Value Ref Range Status   MRSA by PCR NEGATIVE NEGATIVE Final    Comment:        The GeneXpert MRSA Assay (FDA approved for NASAL specimens only), is one component of a comprehensive MRSA colonization surveillance program. It is not intended to diagnose MRSA infection nor to guide or monitor treatment for MRSA infections.   Culture, expectorated sputum-assessment     Status: None   Collection Time: 10/25/14 12:37 AM  Result Value Ref Range Status   Specimen Description INDUCED SPUTUM  Final   Special Requests NONE  Final   Sputum evaluation   Final    MICROSCOPIC FINDINGS SUGGEST THAT THIS SPECIMEN  IS NOT REPRESENTATIVE OF LOWER RESPIRATORY SECRETIONS. PLEASE RECOLLECT. CALLED TO H.DILLON RN 0139 10/25/14 E.GADDY    Report Status 10/25/2014 FINAL  Final     Studies:  Recent x-ray studies have been reviewed in detail by the Attending Physician  Scheduled Meds:  Scheduled Meds: . aspirin EC  81 mg Oral Daily  . calcium-vitamin D  1 tablet Oral Q breakfast  . dexamethasone  4 mg Intravenous 4 times per day  . enoxaparin (LOVENOX) injection  40 mg Subcutaneous Q24H  . furosemide  40 mg Intravenous BID  . ipratropium-albuterol  3 mL Nebulization Q4H  . levETIRAcetam  500 mg Oral BID  . levofloxacin (LEVAQUIN) IV  750 mg Intravenous Q24H  . nicotine  21 mg Transdermal Daily  . PARoxetine  20 mg Oral Daily  . potassium chloride  40 mEq Oral BID  . simvastatin  40 mg Oral q1800    Time spent on care of this  patient: 40 mins   Allie Bossier [facility]  [facility] Office  [phone] Pager - [phone]  On-Call/Text Page:      [email or web address]      password [email or web address]  If 7PM-7AM, please contact night-coverage [email or web address] Password [email or web address] 10/25/2014, 6:08 PM   LOS: 2 days   Care during the described time interval was provided by me .  I have reviewed this patient's available data, including medical history, events of note, physical examination, radiology studies and test results as part of my evaluation  Dia Crawford, MD [phone] Pager

## 2014-10-26 ENCOUNTER — Encounter (HOSPITAL_COMMUNITY): Payer: Self-pay | Admitting: Neurological Surgery

## 2014-10-26 DIAGNOSIS — R29898 Other symptoms and signs involving the musculoskeletal system: Secondary | ICD-10-CM | POA: Diagnosis present

## 2014-10-26 DIAGNOSIS — D496 Neoplasm of unspecified behavior of brain: Secondary | ICD-10-CM

## 2014-10-26 DIAGNOSIS — IMO0002 Reserved for concepts with insufficient information to code with codable children: Secondary | ICD-10-CM | POA: Diagnosis present

## 2014-10-26 DIAGNOSIS — R229 Localized swelling, mass and lump, unspecified: Secondary | ICD-10-CM

## 2014-10-26 LAB — COMPREHENSIVE METABOLIC PANEL
ALBUMIN: 3.6 g/dL (ref 3.5–5.2)
ALT: 62 U/L — ABNORMAL HIGH (ref 0–35)
AST: 71 U/L — AB (ref 0–37)
Alkaline Phosphatase: 59 U/L (ref 39–117)
Anion gap: 7 (ref 5–15)
BUN: 25 mg/dL — AB (ref 6–23)
CO2: 32 mmol/L (ref 19–32)
CREATININE: 1.02 mg/dL (ref 0.50–1.10)
Calcium: 9.4 mg/dL (ref 8.4–10.5)
Chloride: 102 mmol/L (ref 96–112)
GFR calc Af Amer: 64 mL/min — ABNORMAL LOW (ref >90)
GFR calc non Af Amer: 55 mL/min — ABNORMAL LOW (ref >90)
GLUCOSE: 116 mg/dL — AB (ref 70–99)
POTASSIUM: 4.1 mmol/L (ref 3.5–5.1)
Sodium: 141 mmol/L (ref 135–145)
Total Bilirubin: 0.8 mg/dL (ref 0.3–1.2)
Total Protein: 7.1 g/dL (ref 6.0–8.3)

## 2014-10-26 LAB — URINE CULTURE: Colony Count: 8000

## 2014-10-26 LAB — CBC
HEMATOCRIT: 48.1 % — AB (ref 36.0–46.0)
Hemoglobin: 15.4 g/dL — ABNORMAL HIGH (ref 12.0–15.0)
MCH: 29.2 pg (ref 26.0–34.0)
MCHC: 32 g/dL (ref 30.0–36.0)
MCV: 91.1 fL (ref 78.0–100.0)
Platelets: 205 10*3/uL (ref 150–400)
RBC: 5.28 MIL/uL — ABNORMAL HIGH (ref 3.87–5.11)
RDW: 14.7 % (ref 11.5–15.5)
WBC: 13.5 10*3/uL — ABNORMAL HIGH (ref 4.0–10.5)

## 2014-10-26 LAB — MAGNESIUM: Magnesium: 2 mg/dL (ref 1.5–2.5)

## 2014-10-26 MED ORDER — LEVOFLOXACIN 750 MG PO TABS
750.0000 mg | ORAL_TABLET | Freq: Every day | ORAL | Status: AC
  Administered 2014-10-26 – 2014-10-27 (×2): 750 mg via ORAL
  Filled 2014-10-26 (×2): qty 1

## 2014-10-26 NOTE — Progress Notes (Addendum)
Subjective: Awake, no acute problems. CT scan of pelvis shows no primary tumor or metastatic disease.   Objective: Current vital signs: BP 108/66 mmHg  Pulse 66  Temp(Src) 98.2 F (36.8 C) (Oral)  Resp 14  Ht 5\' 6"  (1.676 m)  Wt 126.3 kg (278 lb 7.1 oz)  BMI 44.96 kg/m2  SpO2 94% Vital signs in last 24 hours: Temp:  [97.7 F (36.5 C)-98.9 F (37.2 C)] 98.2 F (36.8 C) (02/14 0402) Pulse Rate:  [66-99] 66 (02/14 0402) Resp:  [13-21] 14 (02/14 0402) BP: (108-148)/(48-82) 108/66 mmHg (02/14 0402) SpO2:  [92 %-98 %] 94 % (02/14 0402) Weight:  [126.3 kg (278 lb 7.1 oz)] 126.3 kg (278 lb 7.1 oz) (02/14 0402)  Intake/Output from previous day: 02/13 0701 - 02/14 0700 In: 840 [P.O.:840] Out: 1000 [Urine:1000] Intake/Output this shift:   Nutritional status: Diet Heart  Neurologic Exam: Mental Status: Alert, oriented, thought content appropriate. Speech fluent without evidence of aphasia.  Cranial Nerves: II: pupils equal, round, reactive to light and accommodation III,IV, VI: left ptosis, extra-ocular motions intact bilaterally V,VII: smile symmetric, facial light touch sensation normal bilaterally  Motor: Right :Upper extremity 5/5Left: Upper extremity 5-/5 with pronator drift Lower extremity 5/5Lower extremity 5/5 Tone and bulk:normal tone throughout; no atrophy noted Sensory:  light touch intact throughout, bilaterally Lab Results: Basic Metabolic Panel:  Recent Labs Lab 10/23/14 1542 10/23/14 1612 10/24/14 0940 10/25/14 0400 10/26/14 0258  NA 142 142 139 140 141  K 3.5 3.1* 3.0* 3.7 4.1  CL 101 98 103 99 102  CO2 29  --  26 33* 32  GLUCOSE 117* 113* 153* 116* 116*  BUN 14 16 15 17  25*  CREATININE 0.78 0.70 0.84 0.83 1.02  CALCIUM 9.3  --  8.9 9.3 9.4  MG  --   --  1.9 2.0 2.0    Liver Function Tests:  Recent Labs Lab 10/23/14 1542  10/24/14 0940 10/25/14 0400 10/26/14 0258  AST 57* 77* 114* 71*  ALT 55* 76* 71* 62*  ALKPHOS 72 66 64 59  BILITOT 0.5 0.5 0.4 0.8  PROT 7.0 6.6 7.1 7.1  ALBUMIN 3.5 3.3* 3.5 3.6   No results for input(s): LIPASE, AMYLASE in the last 168 hours. No results for input(s): AMMONIA in the last 168 hours.  CBC:  Recent Labs Lab 10/23/14 1542 10/23/14 1612 10/24/14 0940 10/24/14 2245 10/26/14 0258  WBC 13.5*  --  8.3 15.8* 13.5*  NEUTROABS 12.1*  --   --  13.9*  --   HGB 14.9 17.0* 14.3 15.1* 15.4*  HCT 45.4 50.0* 43.7 46.3* 48.1*  MCV 88.8  --  89.7 91.7 91.1  PLT 197  --  179 199 205    Cardiac Enzymes:  Recent Labs Lab 10/24/14 0323 10/24/14 0810 10/24/14 2245 10/25/14 0400 10/25/14 1200  TROPONINI 1.32* 1.11* 0.66* 0.55* 0.41*    Lipid Panel:  Recent Labs Lab 10/24/14 0323  CHOL 160  TRIG 170*  HDL 38*  CHOLHDL 4.2  VLDL 34  LDLCALC 88    CBG: No results for input(s): GLUCAP in the last 168 hours.  Microbiology: Results for orders placed or performed during the hospital encounter of 10/23/14  MRSA PCR Screening     Status: None   Collection Time: 10/24/14  6:41 AM  Result Value Ref Range Status   MRSA by PCR NEGATIVE NEGATIVE Final    Comment:        The GeneXpert MRSA Assay (FDA approved for NASAL specimens only),  is one component of a comprehensive MRSA colonization surveillance program. It is not intended to diagnose MRSA infection nor to guide or monitor treatment for MRSA infections.   Culture, expectorated sputum-assessment     Status: None   Collection Time: 10/25/14 12:37 AM  Result Value Ref Range Status   Specimen Description INDUCED SPUTUM  Final   Special Requests NONE  Final   Sputum evaluation   Final    MICROSCOPIC FINDINGS SUGGEST THAT THIS SPECIMEN IS NOT REPRESENTATIVE OF LOWER RESPIRATORY SECRETIONS. PLEASE RECOLLECT. CALLED TO H.DILLON RN 0139 10/25/14 E.GADDY    Report Status 10/25/2014 FINAL  Final     Coagulation Studies:  Recent Labs  10/23/14 1542  LABPROT 13.2  INR 0.99    Imaging: Ct Abdomen Pelvis W Contrast  10/25/2014   CLINICAL DATA:  69 year old female with abnormal brain MRI suggesting metastatic disease. Subsequent encounter.  EXAM: CT ABDOMEN AND PELVIS WITH CONTRAST  TECHNIQUE: Multidetector CT imaging of the abdomen and pelvis was performed using the standard protocol following bolus administration of intravenous contrast.  CONTRAST:  160mL OMNIPAQUE IOHEXOL 300 MG/ML  SOLN  COMPARISON:  Chest CTA 10/23/14.  Brain MRI 10/23/14.  FINDINGS: Negative lung bases.   No acute osseous abnormality identified.  No pelvic free fluid. Negative uterus, adenexa, bladder, rectum. Severe Sigmoid diverticulosis, continues less pronounced to the splenic flexure. No active inflammation. Oral contrast at the hepatic flexure. Negative more proximal colon. No dilated small bowel.  Negative stomach, duodenum, gallbladder, spleen, pancreas, adrenal glands. Negative kidneys; mild calcified atherosclerosis.  There is some hepatic steatosis.  Otherwise negative liver.  No free fluid. Aortoiliac calcified atherosclerosis noted. Major vascular structures are patent. No mass or lymphadenopathy.  IMPRESSION: 1. No primary tumor or metastatic disease identified in the abdomen or pelvis.  2.  Hepatic steatosis.  3.  Diverticulosis of the colon.   Electronically Signed   By: Genevie Ann M.D.   On: 10/25/2014 01:40    Medications:  Scheduled: . aspirin EC  81 mg Oral Daily  . calcium-vitamin D  1 tablet Oral Q breakfast  . dexamethasone  4 mg Intravenous 4 times per day  . enoxaparin (LOVENOX) injection  40 mg Subcutaneous Q24H  . furosemide  40 mg Intravenous BID  . ipratropium-albuterol  3 mL Nebulization Q4H  . levETIRAcetam  500 mg Oral BID  . levofloxacin (LEVAQUIN) IV  750 mg Intravenous Q24H  . nicotine  21 mg Transdermal Daily  . PARoxetine  20 mg Oral Daily  . potassium chloride  40 mEq Oral BID   . simvastatin  40 mg Oral q1800    Assessment/Plan: 69 y.o. female presenting with episodes of LUE numbness and stiffening. Current neurological examination is only significant for a left ptosis and mild left upper extremity drift.MRI brain consistent with metastatic disease. Primary source unclear.   -continue keppra 500mg  BID -continue decadron 4mg  IV q6hrs -consider oncology consultation -CT scan of chest and pelvis did not find primary source -discussed imaging with neuro-radiology, would not suggest LP due to risk of herniation. Will need to discuss case with neurosurgery.    LOS: 3 days   Jim Like, DO [facility] [phone]  If 7pm- 7am, please page neurology on call as listed in [place]. 10/26/2014  7:17 AM

## 2014-10-26 NOTE — Progress Notes (Signed)
Subjective:  No complaints of shortness of breath or chest pain today.    Objective:  Vital Signs in the last 24 hours: BP 123/57 mmHg  Pulse 78  Temp(Src) 99.2 F (37.3 C) (Oral)  Resp 19  Ht 5\' 6"  (1.676 m)  Wt 126.3 kg (278 lb 7.1 oz)  BMI 44.96 kg/m2  SpO2 97%  Physical Exam: Plethoric-appearing obese female in no acute distress Lungs:  Reduced breath sounds with rhonchi  Cardiac:  Regular rhythm, normal S1 and S2, no S3 Extremities:  No edema present  Intake/Output from previous day: 02/13 0701 - 02/14 0700 In: 840 [P.O.:840] Out: 1000 [Urine:1000]  Weight Filed Weights   10/23/14 2032 10/25/14 0415 10/26/14 0402  Weight: 129.8 kg (286 lb 2.5 oz) 128.9 kg (284 lb 2.8 oz) 126.3 kg (278 lb 7.1 oz)    Lab Results: Basic Metabolic Panel:  Recent Labs  10/25/14 0400 10/26/14 0258  NA 140 141  K 3.7 4.1  CL 99 102  CO2 33* 32  GLUCOSE 116* 116*  BUN 17 25*  CREATININE 0.83 1.02   CBC:  Recent Labs  10/23/14 1542  10/24/14 2245 10/26/14 0258  WBC 13.5*  < > 15.8* 13.5*  NEUTROABS 12.1*  --  13.9*  --   HGB 14.9  < > 15.1* 15.4*  HCT 45.4  < > 46.3* 48.1*  MCV 88.8  < > 91.7 91.1  PLT 197  < > 199 205  < > = values in this interval not displayed. Cardiac Enzymes: Troponin (Point of Care Test)  Recent Labs  10/23/14 1610  TROPIPOC 0.36*   Cardiac Panel (last 3 results)  Recent Labs  10/24/14 2245 10/25/14 0400 10/25/14 1200  TROPONINI 0.66* 0.55* 0.41*    Telemetry: Sinus rhythm  Assessment/Plan:  1.  Normal left ventricular function echocardiogram 2.  Dyspnea could have been due to diastolic dysfunction as she has normal LV function.  It is possible in light of her BNP being low that the dyspnea was noncardiac and due to her underlying lung disease and smoking also.   3.  Flat troponin curve without clinical presentation consistent with ischemia 4.  Coronary calcification noted.  Recommendations:  Awaiting workup.  No further  dyspnea.     Kerry Hough  MD [facility] Cardiology  10/26/2014, 12:38 PM

## 2014-10-26 NOTE — Progress Notes (Signed)
[facility] TEAM 1 - Stepdown/ICU TEAM Progress Note  Caitlin Carter IRJ:[id number] DOB: June 02, 1946 DOA: 10/23/2014 PCP: Antony Blackbird, MD  Admit HPI / Brief Narrative: Caitlin Carter is a 69 y.o. Female PMHx  depression, HTN, HLD, OSA on CPAP, obesity, Who comes in c/o b/l arm weakness- left > right. This same thing happened 2 weeks ago but resolved on its own. She also has developed worsening SOB and cough. No fever, no chills. Denies weight loss  In the ER, she was hypoxic, requiring 4 L O2 and then progressed to needing non-rebreather. She had a chest x ray that showed Mild congestive heart failure and interstitial edema is suspected. A CT scan of her head was done that showed vasogenic edema from possible infection vs mets Patient has been smoking since she was 69 years old. Wears Cpap at night only  Hospitalist were asked to admit for further work up of COPD/CHF and vasogenic brain edema- will place in SDU due to respiratory status    HPI/Subjective: 2/14 A/O 4, patient's WOB significantly decreased appears comfortable in bed.    Assessment/Plan: COPD exacerbation in a smoker -Continue Levofloxacin x 10 days -Duoneb q 4 hr -Sputum pending -Respiratory viral panel pending -Titrate O2 to maintain SPO2 89%-93% - OSA -CPAP per respiratory  Weakness/stroke like symptom/metastatic or primary neoplasm brain  - CT shows patchy vasogenic edema with possible multi focal metastatic disease vs infection -Decadron 4 mg  q 6hr -CT abdomen pelvis; nondiagnostic for primary neoplasm will need LP. Already scheduled by neurosurgery for the a.m.  Elevated troponin -Most likely demand ischemia; troponins trending down  -CT angiogram negative for PE -Echocardiogram showed mild diastolic CHF  Chronic diastolic CHF -Continue Lasix 40 mg IV  BID -Strict in and out; since admission-1.5 liters  -Daily A.m. Weight; admission weight= 129.8 kg         2/13 bed weight= 126.3  kg  Hypokalemia -Continue K-Dur 40 mEq  BID -Potassium goal>4  Tobacco Abuse -Continue Nicotine patch  Insomnia -Ambien 5 mg    Code Status: FULL Family Communication: family present at time of exam Disposition Plan: Diagnosis of neoplasm    Consultants: Dr.William Thurman Coyer (cardiology) Dr.David Adah Salvage (neurosurgery) Dr.Peter Marisa Hua (neurology)    Procedure/Significant Events: 2/11 CXR;Borderline cardiomegaly and bilateral interstitial thickening, increased when compared to the prior chest radiograph.  -Mild congestive heart failure and interstitial edema is suspected 2/11 CT head without contrast;Patchy vasogenic edema, usually seen with multi focal metastatic disease or infection. -Multi focal cortical edema suspicious for superimposed infarcts. 2/11 CT angiogram chest PE protocol;No central pulmonary embolus. -Cardiac enlargement with hazy parenchymal opacities suggesting congestive failure with edema. 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  2/12 CT abdomen pelvis with and without contrast pending;  2/12 2D Echocardiogram; - Left ventricle: mild LVH. -LVEF=  60% to 65%. -(grade 1 diastolic dysfunction). 2/13 CT abdomen pelvis with contrast;- No primary tumor or metastatic disease identified in the abdomen or pelvis.- Hepatic steatosis.- Diverticulosis of the colon.    Culture 2/12 blood pending 2/12 urine pending 2/12 sputum pending 2/12 respiratory viral panel pending  Antibiotics: Levofloxacin 2/11>>   DVT prophylaxis: Lovenox stopped on 2/14 for LP procedure in the a.m. SCD   Devices    LINES / TUBES:      Continuous Infusions:   Objective: VITAL SIGNS: Temp:  98.9 F (37.2 C) (02/14 1503) Temp Source: Oral (02/14 1503) BP: 132/58 mmHg (02/14 1501) Pulse Rate: 93 (02/14  1501) SPO2; FIO2:   Intake/Output Summary (Last 24 hours) at 10/26/14 1757 Last data filed at 10/26/14 1000  Gross per 24 hour  Intake    240 ml  Output   1700 ml  Net  -1460 ml     Exam: General: A/O 4, sitting in bed comfortably eating her dinner.  Lungs: diffuse poor air movement but significantly improved from 2/13, No expiratory wheezing significantly decreased from 2/12  Cardiovascular: Regular rate and rhythm without murmur gallop or rub normal S1 and S2 Abdomen: Morbidly obese, Nontender, nondistended, soft, bowel sounds positive, no rebound, no ascites, no appreciable mass Extremities: No significant cyanosis, clubbing. Bilateral lower extremity edema Rt>> Lt (2+  Data Reviewed: Basic Metabolic Panel:  Recent Labs Lab 10/23/14 1542 10/23/14 1612 10/24/14 0940 10/25/14 0400 10/26/14 0258  NA 142 142 139 140 141  K 3.5 3.1* 3.0* 3.7 4.1  CL 101 98 103 99 102  CO2 29  --  26 33* 32  GLUCOSE 117* 113* 153* 116* 116*  BUN 14 16 15 17  25*  CREATININE 0.78 0.70 0.84 0.83 1.02  CALCIUM 9.3  --  8.9 9.3 9.4  MG  --   --  1.9 2.0 2.0   Liver Function Tests:  Recent Labs Lab 10/23/14 1542 10/24/14 0940 10/25/14 0400 10/26/14 0258  AST 57* 77* 114* 71*  ALT 55* 76* 71* 62*  ALKPHOS 72 66 64 59  BILITOT 0.5 0.5 0.4 0.8  PROT 7.0 6.6 7.1 7.1  ALBUMIN 3.5 3.3* 3.5 3.6   No results for input(s): LIPASE, AMYLASE in the last 168 hours. No results for input(s): AMMONIA in the last 168 hours. CBC:  Recent Labs Lab 10/23/14 1542 10/23/14 1612 10/24/14 0940 10/24/14 2245 10/26/14 0258  WBC 13.5*  --  8.3 15.8* 13.5*  NEUTROABS 12.1*  --   --  13.9*  --   HGB 14.9 17.0* 14.3 15.1* 15.4*  HCT 45.4 50.0* 43.7 46.3* 48.1*  MCV 88.8  --  89.7 91.7 91.1  PLT 197  --  179 199 205   Cardiac Enzymes:  Recent Labs Lab 10/24/14 0323 10/24/14 0810 10/24/14 2245 10/25/14 0400 10/25/14 1200  TROPONINI 1.32* 1.11* 0.66* 0.55* 0.41*   BNP (last 3  results)  Recent Labs  10/23/14 1542  BNP 95.2    ProBNP (last 3 results) No results for input(s): PROBNP in the last 8760 hours.  CBG: No results for input(s): GLUCAP in the last 168 hours.  Recent Results (from the past 240 hour(s))  MRSA PCR Screening     Status: None   Collection Time: 10/24/14  6:41 AM  Result Value Ref Range Status   MRSA by PCR NEGATIVE NEGATIVE Final    Comment:        The GeneXpert MRSA Assay (FDA approved for NASAL specimens only), is one component of a comprehensive MRSA colonization surveillance program. It is not intended to diagnose MRSA infection nor to guide or monitor treatment for MRSA infections.   Culture, expectorated sputum-assessment     Status: None   Collection Time: 10/25/14 12:37 AM  Result Value Ref Range Status   Specimen Description INDUCED SPUTUM  Final   Special Requests NONE  Final   Sputum evaluation   Final    MICROSCOPIC FINDINGS SUGGEST THAT THIS SPECIMEN IS NOT REPRESENTATIVE OF LOWER RESPIRATORY SECRETIONS. PLEASE RECOLLECT. CALLED TO H.DILLON  RN 0139 10/25/14 E.GADDY    Report Status 10/25/2014 FINAL  Final     Studies:  Recent x-ray studies have been reviewed in detail by the Attending Physician  Scheduled Meds:  Scheduled Meds: . aspirin EC  81 mg Oral Daily  . calcium-vitamin D  1 tablet Oral Q breakfast  . dexamethasone  4 mg Intravenous 4 times per day  . enoxaparin (LOVENOX) injection  40 mg Subcutaneous Q24H  . furosemide  40 mg Intravenous BID  . ipratropium-albuterol  3 mL Nebulization Q4H  . levETIRAcetam  500 mg Oral BID  . levofloxacin  750 mg Oral Q2000  . nicotine  21 mg Transdermal Daily  . PARoxetine  20 mg Oral Daily  . potassium chloride  40 mEq Oral BID  . simvastatin  40 mg Oral q1800    Time spent on care of this patient: 40 mins   Allie Bossier [facility]  [facility] Office  [phone] Pager - [phone]  On-Call/Text Page:      [email or web address]      password  [email or web address]  If 7PM-7AM, please contact night-coverage [email or web address] Password [facility] 10/26/2014, 5:57 PM   LOS: 3 days   Care during the described time interval was provided by me .  I have reviewed this patient's available data, including medical history, events of note, physical examination, radiology studies and test results as part of my evaluation  Dia Crawford, MD [phone] Pager

## 2014-10-26 NOTE — Consult Note (Signed)
Reason for Consult: Multiple brain lesions Referring Physician: Neurology  Caitlin Carter is an 69 y.o. female.   HPI:  69 year old female admitted with 3 episodes of intermittent shaking of the left arm. She has had some chronic mild headaches also. No diplopia or visual changes. No nausea and vomiting. No fevers or chills. No recent infections of the skin teeth or other organ systems. She had a CT scan which showed edema in both hemispheres and then an MRI which showed greater than 20 small enhancing lesions were felt most consistent with metastatic disease. CT scan of the chest abdomen and pelvis failed to show a primary lesion.  Past Medical History  Diagnosis Date  . HTN (hypertension)   . Obesity   . HLD (hyperlipidemia)   . Depression     Past Surgical History  Procedure Laterality Date  . Replacement total knee      No Known Allergies  History  Substance Use Topics  . Smoking status: Heavy Tobacco Smoker  . Smokeless tobacco: Not on file     Comment: since age 19  . Alcohol Use: Not on file    Family History  Problem Relation Age of Onset  . CAD       Review of Systems  Positive ROS: As above  All other systems have been reviewed and were otherwise negative with the exception of those mentioned in the HPI and as above.  Objective: Vital signs in last 24 hours: Temp:  [97.7 F (36.5 C)-98.9 F (37.2 C)] 98.7 F (37.1 C) (02/14 0700) Pulse Rate:  [66-97] 78 (02/14 0813) Resp:  [13-21] 19 (02/14 0813) BP: (108-144)/(57-74) 123/57 mmHg (02/14 0813) SpO2:  [87 %-100 %] 87 % (02/14 0858) Weight:  [278 lb 7.1 oz (126.3 kg)] 278 lb 7.1 oz (126.3 kg) (02/14 0402)  General Appearance: Alert, cooperative, no distress, appears stated age Head: Normocephalic, without obvious abnormality, atraumatic Eyes: PERRL, conjunctiva/corneas clear, EOM's intact       NEUROLOGIC:   Mental status: A&O x4, no aphasia, good attention span, Memory and fund of knowledge Motor  Exam - grossly normal, normal tone and bulk Sensory Exam - grossly normal Reflexes: Not tested Coordination - not tested Gait - not tested Balance - not tested Cranial Nerves: I: smell Not tested  II: visual acuity  OS: na    OD: na  II: visual fields Full to confrontation  II: pupils Equal, round, reactive to light  III,VII: ptosis None  III,IV,VI: extraocular muscles  Full ROM  V: mastication Normal  V: facial light touch sensation  Normal  V,VII: corneal reflex  Present  VII: facial muscle function - upper  Normal  VII: facial muscle function - lower Normal  VIII: hearing Not tested  IX: soft palate elevation  Normal  IX,X: gag reflex Present  XI: trapezius strength  5/5  XI: sternocleidomastoid strength 5/5  XI: neck flexion strength  5/5  XII: tongue strength  Normal    Data Review Lab Results  Component Value Date   WBC 13.5* 10/26/2014   HGB 15.4* 10/26/2014   HCT 48.1* 10/26/2014   MCV 91.1 10/26/2014   PLT 205 10/26/2014   Lab Results  Component Value Date   NA 141 10/26/2014   K 4.1 10/26/2014   CL 102 10/26/2014   CO2 32 10/26/2014   BUN 25* 10/26/2014   CREATININE 1.02 10/26/2014   GLUCOSE 116* 10/26/2014   Lab Results  Component Value Date   INR 0.99 10/23/2014  Radiology: Dg Chest 2 View  10/23/2014   CLINICAL DATA:  Pt states that she is very SOB and has been congested for the past week now, wheezing.  EXAM: CHEST  2 VIEW  COMPARISON:  01/09/2007  FINDINGS: Cardiac silhouette borderline enlarged. No mediastinal or hilar masses or evidence of adenopathy.  There is bilateral interstitial thickening. No areas of lung consolidation is seen. No pleural effusion or pneumothorax.  Bony thorax is diffusely demineralized but grossly intact.  IMPRESSION: Borderline cardiomegaly and bilateral interstitial thickening. Interstitial thickening may all be chronic, but has increased when compared to the prior chest radiograph. Mild congestive heart failure and  interstitial edema is suspected.   Electronically Signed   By: Lajean Manes M.D.   On: 10/23/2014 16:50   Ct Head Wo Contrast  10/23/2014   CLINICAL DATA:  Right upper extremity weakness.  EXAM: CT HEAD WITHOUT CONTRAST  TECHNIQUE: Contiguous axial images were obtained from the base of the skull through the vertex without intravenous contrast.  COMPARISON:  None.  FINDINGS: Skull and Sinuses:No evidence of bone metastasis. The mastoids and sinuses are clear.  Orbits: No acute abnormality.  Brain: There is diffuse abnormality of the cerebral hemispheres with asymmetric white matter low density that appears to have mass effect, effacing associated sulci. This is confluent throughout the right centrum semiovale and subcortical white matter, more patchy on the left. The appearance favors vasogenic edema as seen with tumor or infection. There are also areas of small cortical low-density in the lateral right temporal lobe, parasagittal left frontal lobe, and left parietal lobe. There is no midline shift, hydrocephalus, or acute hemorrhage.  These results were called by telephone at the time of interpretation on 10/23/2014 at 4:59 pm to Dr. Ernestina Patches , who verbally acknowledged these results.  IMPRESSION: Patchy vasogenic edema, usually seen with multi focal metastatic disease or infection. There is also multi focal cortical edema suspicious for superimposed infarcts. Brain MRI with contrast is recommended.   Electronically Signed   By: Monte Fantasia M.D.   On: 10/23/2014 17:04   Ct Angio Chest Pe W/cm &/or Wo Cm  10/24/2014   ADDENDUM REPORT: 10/24/2014 01:23  ADDENDUM: There is lymphadenopathy in the mediastinum involving the pretracheal, subcarinal, and left aortopulmonic window regions. Possible adenopathy in the left hilum. Appearance worrisome for metastases.   Electronically Signed   By: Lucienne Capers M.D.   On: 10/24/2014 01:23   10/24/2014   CLINICAL DATA:  Shortness of breath for 2 days, worse  this morning.  EXAM: CT ANGIOGRAPHY CHEST WITH CONTRAST  TECHNIQUE: Multidetector CT imaging of the chest was performed using the standard protocol during bolus administration of intravenous contrast. Multiplanar CT image reconstructions and MIPs were obtained to evaluate the vascular anatomy.  CONTRAST:  181mL OMNIPAQUE IOHEXOL 350 MG/ML SOLN  COMPARISON:  None.  FINDINGS: Technically adequate study with moderately good opacification of the central and proximal segmental pulmonary arteries. Contrast bolus is somewhat limited and there is motion artifact, which limits evaluation of peripheral pulmonary arteries. No filling defects demonstrated suggesting no evidence of significant central pulmonary embolus.  Mild cardiac enlargement. Normal caliber thoracic aorta. Aortic calcification. Great vessel origins appear patent. Coronary artery calcifications. Esophagus is decompressed. No significant lymphadenopathy in the chest.  Evaluation of lungs is limited due to respiratory motion artifact. There appears to be patchy airspace disease in the lungs which may indicate edema or multifocal pneumonia. No pleural effusions. No pneumothorax. Airways appear patent although the lower trachea  is somewhat narrowed.  Included portions of the upper abdominal organs are grossly unremarkable. Degenerative changes throughout the spine. No destructive bone lesions.  Review of the MIP images confirms the above findings.  IMPRESSION: No evidence of significant central pulmonary embolus. Peripheral vessels are not well demonstrated. Cardiac enlargement with hazy parenchymal opacities suggesting congestive failure with edema.  Electronically Signed: By: Lucienne Capers M.D. On: 10/23/2014 19:32   Mr Jeri Cos ZC Contrast  10/24/2014   CLINICAL DATA:  Intermittent LEFT upper extremity thickening and seizure like symptoms that spontaneously resolve. Follow-up abnormal CT head.  EXAM: MRI HEAD WITHOUT AND WITH CONTRAST  TECHNIQUE:  Multiplanar, multiecho pulse sequences of the brain and surrounding structures were obtained without and with intravenous contrast.  CONTRAST:  59mL MULTIHANCE GADOBENATE DIMEGLUMINE 529 MG/ML IV SOLN  COMPARISON:  CT head October 23, 2014  FINDINGS: Moderately motion degraded examination.  At least 6 inferred tentorial and greater than 20 supratentorial rim enhancing lesions at the gray-white matter junction, largest measuring up to 14 mm. 7 mm lesion in RIGHT thalamus. 10 mm lesion in LEFT posterior pons at the level of the superior peduncle. Many of the lesions show slight reduced diffusion. Limited assessment for blood products due to severely motion degraded susceptibility weighted imaging. Lesion show no intrinsic T1 shortening. Lesions are T2 bright with extends is associated vasogenic edema. 2 mm RIGHT to LEFT midline shift.  No hydrocephalus. No abnormal extra-axial fluid collections nor axial masses. No abnormal extra-axial enhancement the motion degrades sensitivity. Normal major intracranial vascular flow voids seen at the skull base.  Ocular globes and orbital contents are unremarkable. Paranasal sinuses and mastoid air cells are well aerated.  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 Consider correlation with CSF sampling, and possible PET-CT to assess for primary, as clinically indicated.   Electronically Signed   By: Elon Alas   On: 10/24/2014 01:02     Assessment/Plan: 69 year old female with multiple small enhancing lesions throughout the hemispheres, cerebellum and even brainstem. It is difficult to know with these lesions represent at this point. These could be infectious or metastatic. She looks awfully good to have that many abscesses in her brain. This would argue against infection. However her imaging shows no primary cancer.   I suspect that if this is  metastatic then her prognosis would be fairly grim given the diffuse nature of the disease.   If this represents something infectious that we'll certainly like to get a diagnosis of it can be treated.  I would highly recommend a small volume lumbar puncture to send CSF for studies to rule out an infectious source. I would not draw off 30 mL to look for cytology. Looking at her MRI and reviewing it with one of my partners (Dr. Sherwood Gambler) he agrees that a small volume tap is most likely quite safe. Her aqueduct is open, her CSF spaces are large, and the overall mass effect from the lesions is not all that significant.  I also highly recommend a PET scan.  If a diagnosis can be found no other way then she could potentially be a candidate for a craniotomy for resection one of the lesions. The lesions appear to be too small to get an accurate biopsy with a simple needle approach.   Umaima Scholten S 10/26/2014 11:44 AM

## 2014-10-27 DIAGNOSIS — Z720 Tobacco use: Secondary | ICD-10-CM

## 2014-10-27 DIAGNOSIS — R0602 Shortness of breath: Secondary | ICD-10-CM

## 2014-10-27 LAB — COMPREHENSIVE METABOLIC PANEL
ALT: 55 U/L — AB (ref 0–35)
AST: 36 U/L (ref 0–37)
Albumin: 3.4 g/dL — ABNORMAL LOW (ref 3.5–5.2)
Alkaline Phosphatase: 60 U/L (ref 39–117)
Anion gap: 7 (ref 5–15)
BUN: 27 mg/dL — ABNORMAL HIGH (ref 6–23)
CO2: 31 mmol/L (ref 19–32)
CREATININE: 1 mg/dL (ref 0.50–1.10)
Calcium: 9.3 mg/dL (ref 8.4–10.5)
Chloride: 103 mmol/L (ref 96–112)
GFR calc Af Amer: 66 mL/min — ABNORMAL LOW (ref >90)
GFR calc non Af Amer: 57 mL/min — ABNORMAL LOW (ref >90)
Glucose, Bld: 108 mg/dL — ABNORMAL HIGH (ref 70–99)
Potassium: 4.8 mmol/L (ref 3.5–5.1)
Sodium: 141 mmol/L (ref 135–145)
Total Bilirubin: 0.7 mg/dL (ref 0.3–1.2)
Total Protein: 7 g/dL (ref 6.0–8.3)

## 2014-10-27 LAB — CBC
HEMATOCRIT: 51.3 % — AB (ref 36.0–46.0)
HEMOGLOBIN: 16 g/dL — AB (ref 12.0–15.0)
MCH: 28.5 pg (ref 26.0–34.0)
MCHC: 31.2 g/dL (ref 30.0–36.0)
MCV: 91.3 fL (ref 78.0–100.0)
Platelets: 210 10*3/uL (ref 150–400)
RBC: 5.62 MIL/uL — ABNORMAL HIGH (ref 3.87–5.11)
RDW: 14.7 % (ref 11.5–15.5)
WBC: 11.1 10*3/uL — AB (ref 4.0–10.5)

## 2014-10-27 LAB — CULTURE, RESPIRATORY: SPECIAL REQUESTS: NORMAL

## 2014-10-27 LAB — CULTURE, RESPIRATORY W GRAM STAIN: Culture: NORMAL

## 2014-10-27 LAB — MAGNESIUM: Magnesium: 2.3 mg/dL (ref 1.5–2.5)

## 2014-10-27 MED ORDER — FUROSEMIDE 40 MG PO TABS
40.0000 mg | ORAL_TABLET | Freq: Two times a day (BID) | ORAL | Status: DC
  Administered 2014-10-28 – 2014-10-29 (×3): 40 mg via ORAL
  Filled 2014-10-27 (×6): qty 1

## 2014-10-27 MED ORDER — POTASSIUM CHLORIDE CRYS ER 20 MEQ PO TBCR
20.0000 meq | EXTENDED_RELEASE_TABLET | Freq: Every day | ORAL | Status: DC
  Administered 2014-10-28 – 2014-10-29 (×2): 20 meq via ORAL
  Filled 2014-10-27 (×2): qty 1

## 2014-10-27 MED ORDER — PANTOPRAZOLE SODIUM 40 MG PO TBEC
40.0000 mg | DELAYED_RELEASE_TABLET | Freq: Every day | ORAL | Status: DC
  Administered 2014-10-28 – 2014-10-30 (×3): 40 mg via ORAL
  Filled 2014-10-27 (×3): qty 1

## 2014-10-27 MED ORDER — POTASSIUM CHLORIDE CRYS ER 20 MEQ PO TBCR: 20.0000 meq | EXTENDED_RELEASE_TABLET | Freq: Two times a day (BID) | ORAL | Status: DC

## 2014-10-27 MED ORDER — DIPHENHYDRAMINE HCL 12.5 MG/5ML PO ELIX
12.5000 mg | ORAL_SOLUTION | ORAL | Status: AC | PRN
  Administered 2014-10-28 – 2014-10-29 (×2): 12.5 mg via ORAL
  Filled 2014-10-27 (×4): qty 5

## 2014-10-27 MED ORDER — IPRATROPIUM-ALBUTEROL 0.5-2.5 (3) MG/3ML IN SOLN
3.0000 mL | Freq: Three times a day (TID) | RESPIRATORY_TRACT | Status: DC
  Administered 2014-10-27 – 2014-10-30 (×9): 3 mL via RESPIRATORY_TRACT
  Filled 2014-10-27 (×9): qty 3

## 2014-10-27 NOTE — Evaluation (Signed)
Occupational Therapy Evaluation Patient Details Name: Caitlin Carter MRN: [id number] DOB: [birth year] Today's Date: 10/27/2014    History of Present Illness pt presents with L sided weakness and MRI found to show Metestatic Disease or infectious process. Scans so far have not shown a primary source if it is metastasis.PMHx:HTN, depression, obseity, knee replacement   Clinical Impression   This 69 yo female admitted with above presents to acute OT with decreased balance, decreased mobility, and obesity all affecting her ability to care for herself at home at an independent level as she was pta. She will benefit from acute OT with follow up OT at SNF to get to an independent to Mod I level to return home.    Follow Up Recommendations  SNF    Equipment Recommendations   (TBD at SNF)       Precautions / Restrictions Precautions Precautions: Fall Precaution Comments: watch O2 (stayed in 90's today on RA)      Mobility Bed Mobility Overal bed mobility: Modified Independent Bed Mobility: Supine to Sit     Supine to sit: Modified independent (Device/Increase time);HOB elevated        Transfers Overall transfer level: Needs assistance Equipment used: 1 person hand held assist Transfers: Sit to/from Stand Sit to Stand: Min assist         General transfer comment: Pt ambulated around 1/3 of unit of 3S with min A (one person HHA and holding onto the wall/desk/ handrail, etc on other side)    Balance Overall balance assessment: Needs assistance Sitting-balance support: No upper extremity supported;Feet supported Sitting balance-Leahy Scale: Fair     Standing balance support: Bilateral upper extremity supported Standing balance-Leahy Scale: Poor                              ADL Overall ADL's : Needs assistance/impaired Eating/Feeding: Independent;Sitting   Grooming: Set up;Sitting   Upper Body Bathing: Set up;Sitting   Lower Body Bathing: Moderate  assistance (with min guard A sit<>stand)   Upper Body Dressing : Set up;Sitting   Lower Body Dressing: Moderate assistance (with min guard A sit<>stand)   Toilet Transfer: Minimal assistance;Ambulation (HHA on one side, holding onto rail/wall/desk on other side>around 1/3 of unit 3S)   Toileting- Clothing Manipulation and Hygiene: Minimal assistance (with min guard A sit<>stand)                         Pertinent Vitals/Pain Pain Assessment: No/denies pain     Hand Dominance  right   Extremity/Trunk Assessment Upper Extremity Assessment Upper Extremity Assessment: Overall WFL for tasks assessed              Cognition Arousal/Alertness: Awake/alert Behavior During Therapy: WFL for tasks assessed/performed Overall Cognitive Status: Within Functional Limits for tasks assessed                                Home Living Family/patient expects to be discharged to:: Skilled nursing facility     Type of Home: House                              Lives With: Alone         OT Diagnosis: Generalized weakness   OT Problem List: Decreased strength;Decreased range of motion;Impaired balance (sitting and/or standing);Obesity  OT Treatment/Interventions: Self-care/ADL training;DME and/or AE instruction;Balance training;Patient/family education    OT Goals(Current goals can be found in the care plan section) Acute Rehab OT Goals Patient Stated Goal: to get out of this bed and eat (currently NPO for test) OT Goal Formulation: With patient Time For Goal Achievement: 11/03/14 Potential to Achieve Goals: Good  OT Frequency: Min 2X/week   Barriers to D/C: Decreased caregiver support             End of Session Nurse Communication:  (pt left on BSC)  Activity Tolerance: Patient tolerated treatment well Patient left: with call bell/phone within reach (sitting on [facility])   Time: 1430-1453 OT Time Calculation (min): 23 min Charges:  OT General  Charges $OT Visit: 1 Procedure OT Evaluation $Initial OT Evaluation Tier I: 1 Procedure OT Treatments $Self Care/Home Management : 8-22 mins  Almon Register [phone] 10/27/2014, 3:17 PM

## 2014-10-27 NOTE — Progress Notes (Signed)
Pt placed on CPAP, tolerating well. RT will continue to monitor.

## 2014-10-27 NOTE — Evaluation (Signed)
Speech Language Pathology Evaluation Patient Details Name: Caitlin Carter MRN: [id number] DOB: [birth year] Today's Date: 10/27/2014 Time: 8938-1017 SLP Time Calculation (min) (ACUTE ONLY): 20 min  Problem List:  Patient Active Problem List   Diagnosis Date Noted  . SOB (shortness of breath)   . Left arm weakness   . Mass   . Brain neoplasm   . Elevated troponin   . Neoplasm   . Tobacco abuse   . Hypokalemia   . Stroke-like symptoms 10/23/2014  . COPD exacerbation 10/23/2014  . Vasogenic brain edema 10/23/2014  . Obesity 10/23/2014  . Troponin I above reference range 10/23/2014  . OSA on CPAP 10/23/2014  . COPD (chronic obstructive pulmonary disease) 10/23/2014   Past Medical History:  Past Medical History  Diagnosis Date  . HTN (hypertension)   . Obesity   . HLD (hyperlipidemia)   . Depression    Past Surgical History:  Past Surgical History  Procedure Laterality Date  . 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 MRI showed widespread mass lesions with vasogenic edema, probable metastasis. MRI: At least 6 inferred tentorial and greater than 20 supratentorial rim enhancing lesions at the gray-white matter junction, largest measuring up to 14 mm. 7 mm lesion in RIGHT thalamus. 10 mm lesion in LEFT posterior pons at the level of the superior peduncle. Many of the lesions show slight reduced diffusion. Limited assessment for blood products due to severely motion degraded susceptibility weighted imaging. Lesion show no intrinsic T1 shortening. Lesions are T2 bright with extends is associated vasogenic edema. 2 mm RIGHT to LEFT midline shift.Primary tumor remains unidentified.   Assessment / Plan / Recommendation Clinical Impression  Cognitive-linguistic function appearing WFL. No SLP f/u indicated at this time.     SLP Assessment  Patient does not  need any further Speech Lanaguage Pathology Services    Follow Up Recommendations  None       Pertinent Vitals/Pain Pain Assessment: No/denies pain    SLP Evaluation Prior Functioning  Cognitive/Linguistic Baseline: Within functional limits Type of Home: House  Lives With: Alone   Cognition  Overall Cognitive Status: Within Functional Limits for tasks assessed Arousal/Alertness: Awake/alert    Comprehension  Auditory Comprehension Overall Auditory Comprehension: Appears within functional limits for tasks assessed Reading Comprehension Reading Status: Within funtional limits    Expression Expression Primary Mode of Expression: Verbal Verbal Expression Overall Verbal Expression: Appears within functional limits for tasks assessed   Oral / Motor Oral Motor/Sensory Function Overall Oral Motor/Sensory Function: Appears within functional limits for tasks assessed Motor Speech Overall Motor Speech: Appears within functional limits for tasks assessed   GO   Gabriel Rainwater MA, CCC-SLP [phone]   Caitlin Carter 10/27/2014, 1:56 PM

## 2014-10-27 NOTE — Progress Notes (Signed)
Subjective: Patient has not had a recurrence of focal motor seizure like activity involving left upper extremity. She had no new complaints. She is currently on Keppra and Decadron with no apparent side effects.  Objective: Current vital signs: BP 129/72 mmHg  Pulse 61  Temp(Src) 98.5 F (36.9 C) (Oral)  Resp 16  Ht 5\' 6"  (1.676 m)  Wt 126 kg (277 lb 12.5 oz)  BMI 44.86 kg/m2  SpO2 95%  Neurologic Exam: Patient was alert and in no acute distress. She was well-oriented to time as well as place. Extraocular movements were full and conjugate. Face was symmetrical with no focal weakness. Strength of extremities was normal and symmetrical. Coordination was normal bilaterally.  Medications: I have reviewed the patient's current medications.  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 MRI showed widespread mass lesions with vasogenic edema, probable metastasis. Primary tumor remains unidentified.  Recommend no changes in current management with continuing Decadron for vasogenic swelling of the brain as well as Keppra for seizure control. We will continue to follow this patient with you.  C.R. Nicole Kindred, MD [facility] [phone]  10/27/2014  9:22 AM

## 2014-10-27 NOTE — Progress Notes (Signed)
[place] TEAM 1 - Stepdown/ICU TEAM Progress Note  Caitlin Carter KDT:[id number] DOB: 04/14/1946 DOA: 10/23/2014 PCP: Antony Blackbird, MD  Admit HPI / Brief Narrative: 69 y.o. F Hx depression, HTN, HLD, OSA on CPAP, and obesity who presented w/ c/o B arm weakness left > right. This same thing happened 2 weeks prior but resolved on its own. She also noted worsening SOB and cough.   In the ER she was hypoxic requiring 4 L O2 and then progressed to needing non-rebreather. A chest x ray suggested mild congestive heart failure and interstitial edema. A CT head showed vasogenic edema from possible infection vs metastatic disease.    HPI/Subjective: The pt is alert and conversant.  She is anxious to go home.  She denies cp, sob, n/v, or abdom pain.    Assessment/Plan:  Metastatic or primary neoplasm of brain  -CT shows patchy vasogenic edema with possible multi focal metastatic disease vs infection - clinical sx not c/w infection  -Decadron 4 mg  q 6hr -CT abdomen + pelvis nondiagnostic for primary neoplasm -need low volume LP - per Dr. Sherral Hammers this has been scheduled by Neurosurgery for today  COPD exacerbation in a smoker -much improved today / back to her baseline respiratory status -continue Levofloxacin x 5 days -Duoneb q 4 hr -Sputum not helpful  -Respiratory viral panel pending -Titrate O2 to maintain SPO2 89%-93%  OSA -CPAP per respiratory  Mildly elevated troponin -Most likely demand ischemia; troponins trending down  -CT angiogram negative for PE -Echocardiogram showed mild diastolic CHF -no further w/u indicated or planned   Chronic diastolic CHF -Continue Lasix but resume oral dosing  -since admission net negative 2.8L -admission weight 129.8 kg - currently 126kg  Hypokalemia -replaced to goal   Tobacco Abuse -Continue Nicotine patch  Insomnia -Ambien 5 mg   Obesity - Body mass index is 44.86 kg/(m^2).  Code Status: FULL Family Communication: no family  present at time of exam Disposition Plan: awaiting LP to guide further tx/dignostic pursuits   Consultants: Dr.William Thurman Coyer (Cardiology) Dr.David Adah Salvage (Neurosurgery) Dr.Peter Marisa Hua (Neurology)  Procedure/Significant Events: 2/11 CXR;Borderline cardiomegaly and bilateral interstitial thickening, increased when compared to the prior chest radiograph.  -Mild congestive heart failure and interstitial edema is suspected 2/11 CT head without contrast;Patchy vasogenic edema, usually seen with multi focal metastatic disease or infection. -Multi focal cortical edema suspicious for superimposed infarcts. 2/11 CT angiogram chest PE protocol;No central pulmonary embolus. -Cardiac enlargement with hazy parenchymal opacities suggesting congestive failure with edema. 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  2/12 CT abdomen pelvis with and without contrast pending;  2/12 2D Echocardiogram; - Left ventricle: mild LVH. -LVEF=  60% to 65%. -(grade 1 diastolic dysfunction). 2/13 CT abdomen pelvis with contrast;- No primary tumor or metastatic disease identified in the abdomen or pelvis.- Hepatic steatosis.- Diverticulosis of the colon.  Antibiotics: Levofloxacin 2/11>>  DVT prophylaxis: Lovenox stopped on 2/14 for LP  SCDs  Objective: Blood pressure 129/72, pulse 61, temperature 97.6 F (36.4 C), temperature source Oral, resp. rate 16, height 5\' 6"  (1.676 m), weight 126 kg (277 lb 12.5 oz), SpO2 100 %.  Intake/Output Summary (Last 24 hours) at 10/27/14 1201 Last data filed at 10/27/14 0427  Gross per 24 hour  Intake    240 ml  Output   1000 ml  Net   -760  ml   Exam: General: no acute resp distress  Lungs: distant bs th/o related to body habitus - no wheeze - no focal crackles   Cardiovascular: Regular rate and rhythm without  murmur gallop or rub  Abdomen: Morbidly obese, nontender, nondistended, soft, bowel sounds positive, no rebound, no ascites, no appreciable mass Extremities: No significant cyanosis, clubbing - 1+ B LE edema   Data Reviewed: Basic Metabolic Panel:  Recent Labs Lab 10/23/14 1542 10/23/14 1612 10/24/14 0940 10/25/14 0400 10/26/14 0258 10/27/14 0240  NA 142 142 139 140 141 141  K 3.5 3.1* 3.0* 3.7 4.1 4.8  CL 101 98 103 99 102 103  CO2 29  --  26 33* 32 31  GLUCOSE 117* 113* 153* 116* 116* 108*  BUN 14 16 15 17  25* 27*  CREATININE 0.78 0.70 0.84 0.83 1.02 1.00  CALCIUM 9.3  --  8.9 9.3 9.4 9.3  MG  --   --  1.9 2.0 2.0 2.3   Liver Function Tests:  Recent Labs Lab 10/23/14 1542 10/24/14 0940 10/25/14 0400 10/26/14 0258 10/27/14 0240  AST 57* 77* 114* 71* 36  ALT 55* 76* 71* 62* 55*  ALKPHOS 72 66 64 59 60  BILITOT 0.5 0.5 0.4 0.8 0.7  PROT 7.0 6.6 7.1 7.1 7.0  ALBUMIN 3.5 3.3* 3.5 3.6 3.4*   CBC:  Recent Labs Lab 10/23/14 1542 10/23/14 1612 10/24/14 0940 10/24/14 2245 10/26/14 0258 10/27/14 0240  WBC 13.5*  --  8.3 15.8* 13.5* 11.1*  NEUTROABS 12.1*  --   --  13.9*  --   --   HGB 14.9 17.0* 14.3 15.1* 15.4* 16.0*  HCT 45.4 50.0* 43.7 46.3* 48.1* 51.3*  MCV 88.8  --  89.7 91.7 91.1 91.3  PLT 197  --  179 199 205 210   Cardiac Enzymes:  Recent Labs Lab 10/24/14 0323 10/24/14 0810 10/24/14 2245 10/25/14 0400 10/25/14 1200  TROPONINI 1.32* 1.11* 0.66* 0.55* 0.41*   BNP (last 3 results)  Recent Labs  10/23/14 1542  BNP 95.2     Recent Results (from the past 240 hour(s))  MRSA PCR Screening     Status: None   Collection Time: 10/24/14  6:41 AM  Result Value Ref Range Status   MRSA by PCR NEGATIVE NEGATIVE Final    Comment:        The GeneXpert MRSA Assay (FDA approved for NASAL specimens only), is one component of a comprehensive MRSA colonization surveillance program. It is not intended to diagnose MRSA infection nor to guide  or monitor treatment for MRSA infections.   Culture, blood (routine x 2)     Status: None (Preliminary result)   Collection Time: 10/24/14 10:40 PM  Result Value Ref Range Status   Specimen Description BLOOD RIGHT ARM  Final   Special Requests BOTTLES DRAWN AEROBIC ONLY 4CC  Final   Culture   Final           BLOOD CULTURE RECEIVED NO GROWTH TO DATE CULTURE WILL BE HELD FOR 5 DAYS BEFORE ISSUING A FINAL NEGATIVE REPORT Performed at [facility]    Report Status PENDING  Incomplete  Culture, blood (routine x 2)     Status: None (Preliminary result)   Collection Time: 10/24/14 10:45 PM  Result Value Ref Range Status   Specimen Description BLOOD LEFT HAND  Final   Special Requests BOTTLES DRAWN AEROBIC ONLY 5CC  Final   Culture   Final  BLOOD CULTURE RECEIVED NO GROWTH TO DATE CULTURE WILL BE HELD FOR 5 DAYS BEFORE ISSUING A FINAL NEGATIVE REPORT Performed at [facility]    Report Status PENDING  Incomplete  Culture, expectorated sputum-assessment     Status: None   Collection Time: 10/25/14 12:37 AM  Result Value Ref Range Status   Specimen Description INDUCED SPUTUM  Final   Special Requests NONE  Final   Sputum evaluation   Final    MICROSCOPIC FINDINGS SUGGEST THAT THIS SPECIMEN IS NOT REPRESENTATIVE OF LOWER RESPIRATORY SECRETIONS. PLEASE RECOLLECT. CALLED TO H.DILLON RN 0139 10/25/14 E.GADDY    Report Status 10/25/2014 FINAL  Final  Culture, respiratory (NON-Expectorated)     Status: None (Preliminary result)   Collection Time: 10/25/14 12:37 AM  Result Value Ref Range Status   Specimen Description INDUCED SPUTUM  Final   Special Requests Normal  Final   Gram Stain PENDING  Incomplete   Culture   Final    NORMAL OROPHARYNGEAL FLORA Performed at [facility]    Report Status PENDING  Incomplete  Culture, Urine     Status: None   Collection Time: 10/25/14  6:45 AM  Result Value Ref Range Status   Specimen Description URINE, CLEAN CATCH   Final   Special Requests NONE  Final   Colony Count   Final    8,000 COLONIES/ML Performed at [facility]    Culture   Final    INSIGNIFICANT GROWTH Performed at [facility]    Report Status 10/26/2014 FINAL  Final     Studies:  Recent x-ray studies have been reviewed in detail by the Attending Physician  Scheduled Meds:  Scheduled Meds: . aspirin EC  81 mg Oral Daily  . calcium-vitamin D  1 tablet Oral Q breakfast  . dexamethasone  4 mg Intravenous 4 times per day  . furosemide  40 mg Intravenous BID  . ipratropium-albuterol  3 mL Nebulization TID  . levETIRAcetam  500 mg Oral BID  . levofloxacin  750 mg Oral Q2000  . nicotine  21 mg Transdermal Daily  . PARoxetine  20 mg Oral Daily  . potassium chloride  40 mEq Oral BID  . simvastatin  40 mg Oral q1800    Time spent on care of this patient: 35 mins  Cherene Altes, MD [facility] For Consults/Admissions - Flow Manager - [phone] Office  [phone]  Contact MD directly via text page:      [email or web address]      password [facility]  10/27/2014, 12:01 PM   LOS: 4 days

## 2014-10-27 NOTE — Progress Notes (Signed)
Medicare Important Message given? YES  (If response is "NO", the following Medicare IM given date fields will be blank)  Date Medicare IM given: 10/27/14 Medicare IM given by:  Dahlia Client [employer]

## 2014-10-27 NOTE — Progress Notes (Signed)
Utilization review completed.  

## 2014-10-27 NOTE — Progress Notes (Signed)
Patient Name: Caitlin Carter Date of Encounter: 10/27/2014     Active Problems:   Stroke-like symptoms   COPD exacerbation   Vasogenic brain edema   Obesity   Troponin I above reference range   OSA on CPAP   COPD (chronic obstructive pulmonary disease)   Elevated troponin   Neoplasm   Tobacco abuse   Hypokalemia   Brain neoplasm   Left arm weakness   Mass    SUBJECTIVE  Denies any CP or SOB.  CURRENT MEDS . aspirin EC  81 mg Oral Daily  . calcium-vitamin D  1 tablet Oral Q breakfast  . dexamethasone  4 mg Intravenous 4 times per day  . furosemide  40 mg Intravenous BID  . ipratropium-albuterol  3 mL Nebulization TID  . levETIRAcetam  500 mg Oral BID  . levofloxacin  750 mg Oral Q2000  . nicotine  21 mg Transdermal Daily  . PARoxetine  20 mg Oral Daily  . potassium chloride  40 mEq Oral BID  . simvastatin  40 mg Oral q1800    OBJECTIVE  Filed Vitals:   10/27/14 0500 10/27/14 0800 10/27/14 0958 10/27/14 1002  BP:  129/72    Pulse:  61    Temp:  98.5 F (36.9 C)    TempSrc:  Oral    Resp:  16    Height:      Weight: 277 lb 12.5 oz (126 kg)     SpO2:  95% 97% 97%    Intake/Output Summary (Last 24 hours) at 10/27/14 1240 Last data filed at 10/27/14 0427  Gross per 24 hour  Intake    240 ml  Output   1000 ml  Net   -760 ml   Filed Weights   10/25/14 0415 10/26/14 0402 10/27/14 0500  Weight: 284 lb 2.8 oz (128.9 kg) 278 lb 7.1 oz (126.3 kg) 277 lb 12.5 oz (126 kg)    PHYSICAL EXAM  General: Pleasant, NAD. Morbidly obese. Neuro: Alert and oriented X 3. Moves all extremities spontaneously. Psych: Normal affect. HEENT:  Normal  Neck: Supple without bruits or JVD. Lungs:  Resp regular and unlabored, CTA. Heart: RRR no s3, s4, or murmurs. Abdomen: Soft, non-tender, non-distended, BS + x 4.  Extremities: No clubbing, cyanosis or edema. DP/PT/Radials 2+ and equal bilaterally.  Accessory Clinical Findings  CBC  Recent Labs  10/24/14 2245  10/26/14 0258 10/27/14 0240  WBC 15.8* 13.5* 11.1*  NEUTROABS 13.9*  --   --   HGB 15.1* 15.4* 16.0*  HCT 46.3* 48.1* 51.3*  MCV 91.7 91.1 91.3  PLT 199 205 952   Basic Metabolic Panel  Recent Labs  10/26/14 0258 10/27/14 0240  NA 141 141  K 4.1 4.8  CL 102 103  CO2 32 31  GLUCOSE 116* 108*  BUN 25* 27*  CREATININE 1.02 1.00  CALCIUM 9.4 9.3  MG 2.0 2.3   Liver Function Tests  Recent Labs  10/26/14 0258 10/27/14 0240  AST 71* 36  ALT 62* 55*  ALKPHOS 59 60  BILITOT 0.8 0.7  PROT 7.1 7.0  ALBUMIN 3.6 3.4*   No results for input(s): LIPASE, AMYLASE in the last 72 hours. Cardiac Enzymes  Recent Labs  10/24/14 2245 10/25/14 0400 10/25/14 1200  TROPONINI 0.66* 0.55* 0.41*    TELE NSR with HR 60s, occasional HR 100s    ECG  No new EKG  Echocardiogram 10/24/2014  - Left ventricle: The cavity size was normal. Wall thickness was increased  in a pattern of mild LVH. Systolic function was normal. The estimated ejection fraction was in the range of 60% to 65%. Wall motion was normal; there were no regional wall motion abnormalities. Doppler parameters are consistent with abnormal left ventricular relaxation (grade 1 diastolic dysfunction). - Left atrium: The atrium was mildly dilated.    Radiology/Studies  Dg Chest 2 View  10/23/2014   CLINICAL DATA:  Pt states that she is very SOB and has been congested for the past week now, wheezing.  EXAM: CHEST  2 VIEW  COMPARISON:  01/09/2007  FINDINGS: Cardiac silhouette borderline enlarged. No mediastinal or hilar masses or evidence of adenopathy.  There is bilateral interstitial thickening. No areas of lung consolidation is seen. No pleural effusion or pneumothorax.  Bony thorax is diffusely demineralized but grossly intact.  IMPRESSION: Borderline cardiomegaly and bilateral interstitial thickening. Interstitial thickening may all be chronic, but has increased when compared to the prior chest radiograph.  Mild congestive heart failure and interstitial edema is suspected.   Electronically Signed   By: Lajean Manes M.D.   On: 10/23/2014 16:50   Ct Head Wo Contrast  10/23/2014   CLINICAL DATA:  Right upper extremity weakness.  EXAM: CT HEAD WITHOUT CONTRAST  TECHNIQUE: Contiguous axial images were obtained from the base of the skull through the vertex without intravenous contrast.  COMPARISON:  None.  FINDINGS: Skull and Sinuses:No evidence of bone metastasis. The mastoids and sinuses are clear.  Orbits: No acute abnormality.  Brain: There is diffuse abnormality of the cerebral hemispheres with asymmetric white matter low density that appears to have mass effect, effacing associated sulci. This is confluent throughout the right centrum semiovale and subcortical white matter, more patchy on the left. The appearance favors vasogenic edema as seen with tumor or infection. There are also areas of small cortical low-density in the lateral right temporal lobe, parasagittal left frontal lobe, and left parietal lobe. There is no midline shift, hydrocephalus, or acute hemorrhage.  These results were called by telephone at the time of interpretation on 10/23/2014 at 4:59 pm to Dr. Ernestina Patches , who verbally acknowledged these results.  IMPRESSION: Patchy vasogenic edema, usually seen with multi focal metastatic disease or infection. There is also multi focal cortical edema suspicious for superimposed infarcts. Brain MRI with contrast is recommended.   Electronically Signed   By: Monte Fantasia M.D.   On: 10/23/2014 17:04   Ct Angio Chest Pe W/cm &/or Wo Cm  10/24/2014   ADDENDUM REPORT: 10/24/2014 01:23  ADDENDUM: There is lymphadenopathy in the mediastinum involving the pretracheal, subcarinal, and left aortopulmonic window regions. Possible adenopathy in the left hilum. Appearance worrisome for metastases.   Electronically Signed   By: Lucienne Capers M.D.   On: 10/24/2014 01:23   10/24/2014   CLINICAL DATA:   Shortness of breath for 2 days, worse this morning.  EXAM: CT ANGIOGRAPHY CHEST WITH CONTRAST  TECHNIQUE: Multidetector CT imaging of the chest was performed using the standard protocol during bolus administration of intravenous contrast. Multiplanar CT image reconstructions and MIPs were obtained to evaluate the vascular anatomy.  CONTRAST:  143mL OMNIPAQUE IOHEXOL 350 MG/ML SOLN  COMPARISON:  None.  FINDINGS: Technically adequate study with moderately good opacification of the central and proximal segmental pulmonary arteries. Contrast bolus is somewhat limited and there is motion artifact, which limits evaluation of peripheral pulmonary arteries. No filling defects demonstrated suggesting no evidence of significant central pulmonary embolus.  Mild cardiac enlargement. Normal caliber thoracic aorta. Aortic  calcification. Great vessel origins appear patent. Coronary artery calcifications. Esophagus is decompressed. No significant lymphadenopathy in the chest.  Evaluation of lungs is limited due to respiratory motion artifact. There appears to be patchy airspace disease in the lungs which may indicate edema or multifocal pneumonia. No pleural effusions. No pneumothorax. Airways appear patent although the lower trachea is somewhat narrowed.  Included portions of the upper abdominal organs are grossly unremarkable. Degenerative changes throughout the spine. No destructive bone lesions.  Review of the MIP images confirms the above findings.  IMPRESSION: No evidence of significant central pulmonary embolus. Peripheral vessels are not well demonstrated. Cardiac enlargement with hazy parenchymal opacities suggesting congestive failure with edema.  Electronically Signed: By: Lucienne Capers M.D. On: 10/23/2014 19:32   Mr Jeri Cos MC Contrast  10/24/2014   CLINICAL DATA:  Intermittent LEFT upper extremity thickening and seizure like symptoms that spontaneously resolve. Follow-up abnormal CT head.  EXAM: MRI HEAD WITHOUT  AND WITH CONTRAST  TECHNIQUE: Multiplanar, multiecho pulse sequences of the brain and surrounding structures were obtained without and with intravenous contrast.  CONTRAST:  12mL MULTIHANCE GADOBENATE DIMEGLUMINE 529 MG/ML IV SOLN  COMPARISON:  CT head October 23, 2014  FINDINGS: Moderately motion degraded examination.  At least 6 inferred tentorial and greater than 20 supratentorial rim enhancing lesions at the gray-white matter junction, largest measuring up to 14 mm. 7 mm lesion in RIGHT thalamus. 10 mm lesion in LEFT posterior pons at the level of the superior peduncle. Many of the lesions show slight reduced diffusion. Limited assessment for blood products due to severely motion degraded susceptibility weighted imaging. Lesion show no intrinsic T1 shortening. Lesions are T2 bright with extends is associated vasogenic edema. 2 mm RIGHT to LEFT midline shift.  No hydrocephalus. No abnormal extra-axial fluid collections nor axial masses. No abnormal extra-axial enhancement the motion degrades sensitivity. Normal major intracranial vascular flow voids seen at the skull base.  Ocular globes and orbital contents are unremarkable. Paranasal sinuses and mastoid air cells are well aerated.  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 Consider correlation with CSF sampling, and possible PET-CT to assess for primary, as clinically indicated.   Electronically Signed   By: Elon Alas   On: 10/24/2014 01:02   Ct Abdomen Pelvis W Contrast  10/25/2014   CLINICAL DATA:  69 year old female with abnormal brain MRI suggesting metastatic disease. Subsequent encounter.  EXAM: CT ABDOMEN AND PELVIS WITH CONTRAST  TECHNIQUE: Multidetector CT imaging of the abdomen and pelvis was performed using the standard protocol following bolus administration of intravenous contrast.  CONTRAST:  133mL OMNIPAQUE  IOHEXOL 300 MG/ML  SOLN  COMPARISON:  Chest CTA 10/23/14.  Brain MRI 10/23/14.  FINDINGS: Negative lung bases.   No acute osseous abnormality identified.  No pelvic free fluid. Negative uterus, adenexa, bladder, rectum. Severe Sigmoid diverticulosis, continues less pronounced to the splenic flexure. No active inflammation. Oral contrast at the hepatic flexure. Negative more proximal colon. No dilated small bowel.  Negative stomach, duodenum, gallbladder, spleen, pancreas, adrenal glands. Negative kidneys; mild calcified atherosclerosis.  There is some hepatic steatosis.  Otherwise negative liver.  No free fluid. Aortoiliac calcified atherosclerosis noted. Major vascular structures are patent. No mass or lymphadenopathy.  IMPRESSION: 1. No primary tumor or metastatic disease identified in the abdomen or pelvis.  2.  Hepatic steatosis.  3.  Diverticulosis of the colon.  Electronically Signed   By: Genevie Ann M.D.   On: 10/25/2014 01:40    ASSESSMENT AND PLAN  69 yo with history of smoking, HTN, and hyperlipidemia presented to the ER on 2/11 with right arm weakness as well as dyspnea and cough. She was found to have probable brain mets on MRI as well as mildly elevated troponin.   1. Mildly elevated trop  - coronary CA noted on CT, however has been flat. No CP to indicate ACS.   - off heparin given risk of bleeding with brain mets overweight benefit given lack of evidence of ACS  - Echo 10/24/2014 EF 60-65%, mild LVH, no RWMA, grade 1 diastolic dysfunction  - no indication for aggressive cardiac workup, continue to denies any CP. SOB improved after diuresis. Will discuss with MD, favor outpatient followup and eval later once neurological/Brain mets workup is completed and no further surgery is indicated.   - at some point, may also benefit from low dose BB, will avoid now given potentially COPD exacerbation  2. Possible acute on chronic diastolic CHF:  - placed on IV lasix for hypoxia and sign of fluid  overload   - appear to be near euvolemic, consider switch to 40mg  PO daily lasix tomorrow.   3. Brain mets: Undergoing workup for primary CA  - CT of chest and pelvis negative for primary tumor or metastatic disease  - initially planning for LP to search for tumor, however neuro-radiology advised against LP due to risk of herniation  - neurosurgery consulted, felt if metastatic her prognosis grim given diffuse nature of dx. Neurosurgery feels small area lumbar puncture can be done safely.  If unable to confirm diagnosis, last resort would be craniotomy with bipsy  4. Tobacco abuse: discussed regarding tobacco cessation, patient states she is determined to quit this time.   Hilbert Corrigan PA-C Pager: [phone]    The patient was seen, examined and discussed with Almyra Deforest, PA-C and I agree with the above.   No further cardiac/ischemic work up at this point, we will follow as an outpatient once her neurologic/oncologic work up is completed. Can switch to oral lasix 40 mg po daily.   Dorothy Spark 10/27/2014

## 2014-10-28 ENCOUNTER — Encounter: Payer: Self-pay | Admitting: Cardiology

## 2014-10-28 ENCOUNTER — Inpatient Hospital Stay (HOSPITAL_COMMUNITY): Payer: Medicare Other

## 2014-10-28 DIAGNOSIS — R9089 Other abnormal findings on diagnostic imaging of central nervous system: Secondary | ICD-10-CM | POA: Diagnosis present

## 2014-10-28 LAB — COMPREHENSIVE METABOLIC PANEL
ALBUMIN: 3.7 g/dL (ref 3.5–5.2)
ALT: 47 U/L — AB (ref 0–35)
AST: 27 U/L (ref 0–37)
Alkaline Phosphatase: 66 U/L (ref 39–117)
Anion gap: 9 (ref 5–15)
BILIRUBIN TOTAL: 0.5 mg/dL (ref 0.3–1.2)
BUN: 41 mg/dL — ABNORMAL HIGH (ref 6–23)
CO2: 27 mmol/L (ref 19–32)
CREATININE: 1.34 mg/dL — AB (ref 0.50–1.10)
Calcium: 9.2 mg/dL (ref 8.4–10.5)
Chloride: 102 mmol/L (ref 96–112)
GFR calc Af Amer: 46 mL/min — ABNORMAL LOW (ref >90)
GFR calc non Af Amer: 40 mL/min — ABNORMAL LOW (ref >90)
Glucose, Bld: 137 mg/dL — ABNORMAL HIGH (ref 70–99)
Potassium: 4.8 mmol/L (ref 3.5–5.1)
Sodium: 138 mmol/L (ref 135–145)
Total Protein: 6.9 g/dL (ref 6.0–8.3)

## 2014-10-28 LAB — CSF CELL COUNT WITH DIFFERENTIAL
RBC Count, CSF: 6925 /mm3 — ABNORMAL HIGH
Tube #: 2
WBC CSF: 7 /mm3 — AB (ref 0–5)

## 2014-10-28 LAB — CBC WITH DIFFERENTIAL/PLATELET
Basophils Absolute: 0 10*3/uL (ref 0.0–0.1)
Basophils Relative: 0 % (ref 0–1)
EOS PCT: 0 % (ref 0–5)
Eosinophils Absolute: 0 10*3/uL (ref 0.0–0.7)
HEMATOCRIT: 53.6 % — AB (ref 36.0–46.0)
Hemoglobin: 17.5 g/dL — ABNORMAL HIGH (ref 12.0–15.0)
LYMPHS ABS: 1.3 10*3/uL (ref 0.7–4.0)
LYMPHS PCT: 10 % — AB (ref 12–46)
MCH: 30.1 pg (ref 26.0–34.0)
MCHC: 32.6 g/dL (ref 30.0–36.0)
MCV: 92.3 fL (ref 78.0–100.0)
Monocytes Absolute: 1 10*3/uL (ref 0.1–1.0)
Monocytes Relative: 8 % (ref 3–12)
Neutro Abs: 10.7 10*3/uL — ABNORMAL HIGH (ref 1.7–7.7)
Neutrophils Relative %: 82 % — ABNORMAL HIGH (ref 43–77)
Platelets: 229 10*3/uL (ref 150–400)
RBC: 5.81 MIL/uL — ABNORMAL HIGH (ref 3.87–5.11)
RDW: 14.7 % (ref 11.5–15.5)
WBC: 13 10*3/uL — AB (ref 4.0–10.5)

## 2014-10-28 LAB — BASIC METABOLIC PANEL
Anion gap: 14 (ref 5–15)
BUN: 38 mg/dL — AB (ref 6–23)
CHLORIDE: 101 mmol/L (ref 96–112)
CO2: 24 mmol/L (ref 19–32)
Calcium: 9.8 mg/dL (ref 8.4–10.5)
Creatinine, Ser: 0.97 mg/dL (ref 0.50–1.10)
GFR calc non Af Amer: 59 mL/min — ABNORMAL LOW (ref >90)
GFR, EST AFRICAN AMERICAN: 68 mL/min — AB (ref >90)
Glucose, Bld: 109 mg/dL — ABNORMAL HIGH (ref 70–99)
Potassium: 4.3 mmol/L (ref 3.5–5.1)
Sodium: 139 mmol/L (ref 135–145)

## 2014-10-28 LAB — RESPIRATORY VIRUS PANEL
Adenovirus: NEGATIVE
INFLUENZA A: NEGATIVE
INFLUENZA B 1: NEGATIVE
Metapneumovirus: NEGATIVE
Parainfluenza 1: NEGATIVE
Parainfluenza 2: NEGATIVE
Parainfluenza 3: NEGATIVE
RESPIRATORY SYNCYTIAL VIRUS A: NEGATIVE
RESPIRATORY SYNCYTIAL VIRUS B: NEGATIVE
Rhinovirus: NEGATIVE

## 2014-10-28 LAB — HEMOGLOBIN A1C
HEMOGLOBIN A1C: 5.6 % (ref 4.8–5.6)
Mean Plasma Glucose: 114 mg/dL

## 2014-10-28 LAB — MAGNESIUM: Magnesium: 2.4 mg/dL (ref 1.5–2.5)

## 2014-10-28 LAB — PROTEIN, CSF: Total  Protein, CSF: 56 mg/dL — ABNORMAL HIGH (ref 15–45)

## 2014-10-28 LAB — GLUCOSE, CSF: Glucose, CSF: 63 mg/dL (ref 43–76)

## 2014-10-28 MED ORDER — ACETAMINOPHEN 325 MG PO TABS: 650.0000 mg | ORAL_TABLET | ORAL | Status: DC | PRN

## 2014-10-28 NOTE — Progress Notes (Signed)
Physical Therapy Treatment Patient Details Name: Caitlin Carter MRN: [id number] DOB: 1946-09-05 Today's Date: 10/28/2014    History of Present Illness pt presents with L sided weakness and MRI found to show Metestatic Disease or infectious process. Scans so far have not shown a primary source if it is metastasis.PMHx:HTN, depression, obseity, knee replacement    PT Comments    Pt much improved from [name] needing RW for ambulation, but O2 sats remained above 90% today.  Pt would still benefit from SNF at D/C to maximize independence prior to returning home alone.  Will continue to follow.    Follow Up Recommendations  SNF     Equipment Recommendations  Rolling walker with 5" wheels    Recommendations for Other Services       Precautions / Restrictions Precautions Precautions: Fall Restrictions Weight Bearing Restrictions: No    Mobility  Bed Mobility Overal bed mobility: Modified Independent                Transfers Overall transfer level: Needs assistance Equipment used: Rolling walker (2 wheeled) Transfers: Sit to/from Stand Sit to Stand: Min guard         General transfer comment: No physical A needed to come to stand.    Ambulation/Gait Ambulation/Gait assistance: Min guard Ambulation Distance (Feet): 200 Feet Assistive device: Rolling walker (2 wheeled) Gait Pattern/deviations: Step-through pattern;Decreased stride length     General Gait Details: pt moving well with RW during ambulation.  pt indicates stiffness in R knee from fall pt had PTA.     Stairs            Wheelchair Mobility    Modified Rankin (Stroke Patients Only)       Balance Overall balance assessment: Needs assistance Sitting-balance support: No upper extremity supported;Feet supported Sitting balance-Leahy Scale: Fair     Standing balance support: Bilateral upper extremity supported;During functional activity Standing balance-Leahy  Scale: Poor                      Cognition Arousal/Alertness: Awake/alert Behavior During Therapy: WFL for tasks assessed/performed Overall Cognitive Status: Within Functional Limits for tasks assessed                      Exercises      General Comments        Pertinent Vitals/Pain Pain Assessment: No/denies pain    Home Living                      Prior Function            PT Goals (current goals can now be found in the care plan section) Acute Rehab PT Goals Patient Stated Goal: to get out of this bed and eat (currently NPO for test) PT Goal Formulation: With patient Time For Goal Achievement: 11/07/14 Potential to Achieve Goals: Good Progress towards PT goals: Progressing toward goals    Frequency  Min 3X/week    PT Plan Current plan remains appropriate    Co-evaluation             End of Session Equipment Utilized During Treatment: Gait belt Activity Tolerance: Patient tolerated treatment well Patient left: in chair;with call bell/phone within reach     Time: 0923-0952 PT Time Calculation (min) (ACUTE ONLY): 29 min  Charges:  $Gait Training: 23-37 mins  G CodesCatarina Carter, [place] 10/28/2014, 11:13 AM

## 2014-10-28 NOTE — Progress Notes (Signed)
[facility] TEAM 1 - Stepdown/ICU TEAM Progress Note  Caitlin Carter WRU:[id number] DOB: 19-Feb-1946 DOA: 10/23/2014 PCP: Antony Blackbird, MD  Admit HPI / Brief Narrative: Caitlin Carter is a 69 y.o. Female PMHx  depression, HTN, HLD, OSA on CPAP, obesity, Who comes in c/o b/l arm weakness- left > right. This same thing happened 2 weeks ago but resolved on its own. She also has developed worsening SOB and cough. No fever, no chills. Denies weight loss  In the ER, she was hypoxic, requiring 4 L O2 and then progressed to needing non-rebreather. She had a chest x ray that showed Mild congestive heart failure and interstitial edema is suspected. A CT scan of her head was done that showed vasogenic edema from possible infection vs mets Patient has been smoking since she was 69 years old. Wears Cpap at night only  Hospitalist were asked to admit for further work up of COPD/CHF and vasogenic brain edema- will place in SDU due to respiratory status    HPI/Subjective: 2/16 A/O 4, patient's understands her LP will be performed today   Assessment/Plan: COPD exacerbation in a smoker -Continue Levofloxacin x 10 days -Duoneb q 4 hr -Sputum pending -Respiratory viral panel pending -Titrate O2 to maintain SPO2 89%-93% - OSA -CPAP per respiratory  Weakness/stroke like symptom/metastatic or primary neoplasm brain  - CT shows patchy vasogenic edema with possible multi focal metastatic disease vs infection -Decadron 4 mg  q 6hr -CT abdomen pelvis; nondiagnostic for primary neoplasm will need LP. Already scheduled by neurosurgery for the a.m. -Per Dr.Charles Nicole Kindred (neurology) he has scheduled LP with IR today  Elevated troponin -Most likely demand ischemia; troponins trending down  -CT angiogram negative for PE -Echocardiogram showed mild diastolic CHF  Chronic diastolic CHF -Continue Lasix 40 mg IV  BID -Strict in and out; since admission-3.6 liters  -Daily A.m. Weight; admission  weight= 129.8 kg         2/16 bed weight= 124.4 kg  Hypokalemia -Continue K-Dur 40 mEq  BID -Potassium goal>4  Tobacco Abuse -Continue Nicotine patch  Insomnia -Ambien 5 mg    Code Status: FULL Family Communication: family present at time of exam Disposition Plan: Diagnosis of neoplasm    Consultants: Dr.William Thurman Coyer (cardiology) Dr.David Adah Salvage (neurosurgery) Dr.Peter Marisa Hua (neurology)    Procedure/Significant Events: 2/11 CXR;Borderline cardiomegaly and bilateral interstitial thickening, increased when compared to the prior chest radiograph.  -Mild congestive heart failure and interstitial edema is suspected 2/11 CT head without contrast;Patchy vasogenic edema, usually seen with multi focal metastatic disease or infection. -Multi focal cortical edema suspicious for superimposed infarcts. 2/11 CT angiogram chest PE protocol;No central pulmonary embolus. -Cardiac enlargement with hazy parenchymal opacities suggesting congestive failure with edema. 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  2/12 CT abdomen pelvis with and without contrast pending;  2/12 2D Echocardiogram; - Left ventricle: mild LVH. -LVEF=  60% to 65%. -(grade 1 diastolic dysfunction). 2/13 CT abdomen pelvis with contrast;- No primary tumor or metastatic disease identified in the abdomen or pelvis.- Hepatic steatosis.- Diverticulosis of the colon.    Culture 2/12 blood pending 2/12 urine pending 2/12 sputum pending 2/12 respiratory viral panel pending  Antibiotics: Levofloxacin 2/11>>   DVT prophylaxis: Lovenox stopped on 2/14 for LP procedure in the a.m. SCD   Devices    LINES / TUBES:  Continuous Infusions:   Objective: VITAL SIGNS: Temp: 97.8 F (36.6 C) (02/16 1619) Temp Source: Oral (02/16 1619) BP: 139/54  mmHg (02/16 1619) Pulse Rate: 94 (02/16 1619) SPO2; FIO2:   Intake/Output Summary (Last 24 hours) at 10/28/14 1923 Last data filed at 10/28/14 1800  Gross per 24 hour  Intake    780 ml  Output    225 ml  Net    555 ml     Exam: General: A/O 4, sitting in bed comfortably eating her breakfast  Lungs: clear to auscultation bilateral  No expiratory wheezing Cardiovascular: Regular rate and rhythm without murmur gallop or rub normal S1 and S2 Abdomen: Morbidly obese, Nontender, nondistended, soft, bowel sounds positive, no rebound, no ascites, no appreciable mass Extremities: No significant cyanosis, clubbing. Bilateral lower extremity edema Rt>> Lt (1+  Data Reviewed: Basic Metabolic Panel:  Recent Labs Lab 10/24/14 0940 10/25/14 0400 10/26/14 0258 10/27/14 0240 10/28/14 0307  NA 139 140 141 141 139  K 3.0* 3.7 4.1 4.8 4.3  CL 103 99 102 103 101  CO2 26 33* 32 31 24  GLUCOSE 153* 116* 116* 108* 109*  BUN 15 17 25* 27* 38*  CREATININE 0.84 0.83 1.02 1.00 0.97  CALCIUM 8.9 9.3 9.4 9.3 9.8  MG 1.9 2.0 2.0 2.3  --    Liver Function Tests:  Recent Labs Lab 10/23/14 1542 10/24/14 0940 10/25/14 0400 10/26/14 0258 10/27/14 0240  AST 57* 77* 114* 71* 36  ALT 55* 76* 71* 62* 55*  ALKPHOS 72 66 64 59 60  BILITOT 0.5 0.5 0.4 0.8 0.7  PROT 7.0 6.6 7.1 7.1 7.0  ALBUMIN 3.5 3.3* 3.5 3.6 3.4*   No results for input(s): LIPASE, AMYLASE in the last 168 hours. No results for input(s): AMMONIA in the last 168 hours. CBC:  Recent Labs Lab 10/23/14 1542 10/23/14 1612 10/24/14 0940 10/24/14 2245 10/26/14 0258 10/27/14 0240  WBC 13.5*  --  8.3 15.8* 13.5* 11.1*  NEUTROABS 12.1*  --   --  13.9*  --   --   HGB 14.9 17.0* 14.3 15.1* 15.4* 16.0*  HCT 45.4 50.0* 43.7 46.3* 48.1* 51.3*  MCV 88.8  --  89.7 91.7 91.1 91.3  PLT 197  --  179 199 205 210   Cardiac Enzymes:  Recent Labs Lab 10/24/14 0323 10/24/14 0810 10/24/14 2245 10/25/14 0400 10/25/14 1200  TROPONINI  1.32* 1.11* 0.66* 0.55* 0.41*   BNP (last 3 results)  Recent Labs  10/23/14 1542  BNP 95.2    ProBNP (last 3 results) No results for input(s): PROBNP in the last 8760 hours.  CBG: No results for input(s): GLUCAP in the last 168 hours.  Recent Results (from the past 240 hour(s))  MRSA PCR Screening     Status: None   Collection Time: 10/24/14  6:41 AM  Result Value Ref Range Status   MRSA by PCR NEGATIVE NEGATIVE Final    Comment:        The GeneXpert MRSA Assay (FDA approved for NASAL specimens only), is one component of a comprehensive MRSA colonization surveillance program. It is not intended to diagnose MRSA infection nor to guide or monitor treatment for MRSA infections.   Culture, blood (routine x 2)     Status: None (Preliminary result)   Collection Time: 10/24/14 10:40 PM  Result Value Ref Range Status   Specimen Description BLOOD RIGHT ARM  Final   Special Requests BOTTLES DRAWN AEROBIC ONLY 4CC  Final   Culture  Final           BLOOD CULTURE RECEIVED NO GROWTH TO DATE CULTURE WILL BE HELD FOR 5 DAYS BEFORE ISSUING A FINAL NEGATIVE REPORT Performed at [facility]    Report Status PENDING  Incomplete  Culture, blood (routine x 2)     Status: None (Preliminary result)   Collection Time: 10/24/14 10:45 PM  Result Value Ref Range Status   Specimen Description BLOOD LEFT HAND  Final   Special Requests BOTTLES DRAWN AEROBIC ONLY 5CC  Final   Culture   Final           BLOOD CULTURE RECEIVED NO GROWTH TO DATE CULTURE WILL BE HELD FOR 5 DAYS BEFORE ISSUING A FINAL NEGATIVE REPORT Performed at [facility]    Report Status PENDING  Incomplete  Culture, expectorated sputum-assessment     Status: None   Collection Time: 10/25/14 12:37 AM  Result Value Ref Range Status   Specimen Description INDUCED SPUTUM  Final   Special Requests NONE  Final   Sputum evaluation   Final    MICROSCOPIC FINDINGS SUGGEST THAT THIS SPECIMEN IS NOT REPRESENTATIVE OF  LOWER RESPIRATORY SECRETIONS. PLEASE RECOLLECT. CALLED TO H.DILLON RN 0139 10/25/14 E.GADDY    Report Status 10/25/2014 FINAL  Final  Culture, respiratory (NON-Expectorated)     Status: None   Collection Time: 10/25/14 12:37 AM  Result Value Ref Range Status   Specimen Description INDUCED SPUTUM  Final   Special Requests Normal  Final   Gram Stain   Final    RARE WBC PRESENT, PREDOMINANTLY PMN FEW SQUAMOUS EPITHELIAL CELLS PRESENT MODERATE GRAM POSITIVE COCCI IN PAIRS MODERATE GRAM POSITIVE RODS MODERATE GRAM NEGATIVE RODS    Culture   Final    NORMAL OROPHARYNGEAL FLORA Performed at [facility]    Report Status 10/27/2014 FINAL  Final  Culture, Urine     Status: None   Collection Time: 10/25/14  6:45 AM  Result Value Ref Range Status   Specimen Description URINE, CLEAN CATCH  Final   Special Requests NONE  Final   Colony Count   Final    8,000 COLONIES/ML Performed at [facility]    Culture   Final    INSIGNIFICANT GROWTH Performed at [facility]    Report Status 10/26/2014 FINAL  Final     Studies:  Recent x-ray studies have been reviewed in detail by the Attending Physician  Scheduled Meds:  Scheduled Meds: . aspirin EC  81 mg Oral Daily  . calcium-vitamin D  1 tablet Oral Q breakfast  . dexamethasone  4 mg Intravenous 4 times per day  . furosemide  40 mg Oral BID  . ipratropium-albuterol  3 mL Nebulization TID  . levETIRAcetam  500 mg Oral BID  . nicotine  21 mg Transdermal Daily  . pantoprazole  40 mg Oral Q1200  . PARoxetine  20 mg Oral Daily  . potassium chloride  20 mEq Oral Daily  . simvastatin  40 mg Oral q1800    Time spent on care of this patient: 40 mins   Caitlin Carter  [facility] Office  [phone] Pager - [phone]  On-Call/Text Page:      [email or web address]      password [email or web address]  If 7PM-7AM, please contact night-coverage [email or web address] Password [email or web address] 10/28/2014, 7:23 PM   LOS: 5 days   Care  during the described time interval was provided by me .  I have reviewed this patient's  available data, including medical history, events of note, physical examination, radiology studies and test results as part of my evaluation  Dia Crawford, MD [phone] Pager

## 2014-10-28 NOTE — Progress Notes (Signed)
Subjective: Patient had no new complaints, except for mild blurring of vision looking at television. She has not had a recurrence of focal seizure activity involving left upper extremity.  Objective: Current vital signs: BP 124/62 mmHg  Pulse 55  Temp(Src) 97.7 F (36.5 C) (Oral)  Resp 13  Ht 5\' 6"  (1.676 m)  Wt 124.4 kg (274 lb 4 oz)  BMI 44.29 kg/m2  SpO2 100%  Neurologic Exam: Patient was alert and in no acute distress. She was well-oriented to time as well as place. She had no expressive or receptive aphasia. Extraocular movements were full and conjugate. Visual fields were intact and normal. No facial weakness was noted. Speech was normal with no dysarthria. Motor exam showed normal strength throughout. Coordination of upper extremities was normal.  Medications: I have reviewed the patient's current medications.  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 MRI showed widespread mass lesions with vasogenic edema, probable metastasis. Primary tumor remains unidentified.  Plan: Small volume lumbar puncture under fluoroscopy was rescheduled, and is pending. Recommend no changes in current doses of Decadron for management of vasogenic brain edema, and heparin for seizure control.  We will continue to follow this patient with you.  C.R. Nicole Kindred, MD [facility] [phone]  10/28/2014  1:44 PM

## 2014-10-28 NOTE — Clinical Social Work Placement (Signed)
Clinical Social Work Department CLINICAL SOCIAL WORK PLACEMENT NOTE 10/28/2014  Patient:  LILE, MCCURLEY  Account Number:  [id number] Admit date:  10/23/2014  Clinical Social Worker:  Daiva Huge  Date/time:  10/28/2014 11:32 AM  Clinical Social Work is seeking post-discharge placement for this patient at the following level of care:   SKILLED NURSING   (*CSW will update this form in Epic as items are completed)   10/28/2014  Patient/family provided with [facility] Department of Clinical Social Work's list of facilities offering this level of care within the geographic area requested by the patient (or if unable, by the patient's family).  10/28/2014  Patient/family informed of their freedom to choose among providers that offer the needed level of care, that participate in Medicare, Medicaid or managed care program needed by the patient, have an available bed and are willing to accept the patient.  10/28/2014  Patient/family informed of [facility]' ownership interest in [facility], as well as of the fact that they are under no obligation to receive care at this facility.  PASARR submitted to EDS on 10/27/2014 PASARR number received on 10/27/2014  FL2 transmitted to all facilities in geographic area requested by pt/family on  10/28/2014 FL2 transmitted to all facilities within larger geographic area on   Patient informed that his/her managed care company has contracts with or will negotiate with  certain facilities, including the following:     Patient/family informed of bed offers received:   Patient chooses bed at  Physician recommends and patient chooses bed at    Patient to be transferred to  on   Patient to be transferred to facility by  Patient and family notified of transfer on  Name of family member notified:    The following physician request were entered in Epic:   Additional Comments: Eduard Clos, MSW, [place]

## 2014-10-28 NOTE — Clinical Social Work Psychosocial (Signed)
Clinical Social Work Department BRIEF PSYCHOSOCIAL ASSESSMENT 10/28/2014  Patient:  Caitlin Carter, Caitlin Carter     Account Number:  [id number]     Admit date:  10/23/2014  Clinical Social Worker:  Daiva Huge  Date/Time:  10/28/2014 11:21 AM  Referred by:  Physician  Date Referred:  10/28/2014 Referred for  SNF Placement   Other Referral:   Interview type:  Patient Other interview type:    PSYCHOSOCIAL DATA Living Status:  ALONE Admitted from facility:   Level of care:   Primary support name:  siblings- 2 brothers, 2 sisters, friends Primary support relationship to patient:  FAMILY Degree of support available:   good but minimal physical assist/support- "everyone is busy with their own lives"    CURRENT CONCERNS Current Concerns  Post-Acute Placement   Other Concerns:    SOCIAL WORK ASSESSMENT / PLAN CSW met with patient who was sitting in recliner in her 3S room. CSW introduced self/role to patient who was pleasant, engaged and appreciative of visit. "I came in because of my hand being numb and they found I have brain cancer". Patient spoke very openly about this and expressed some fear related to what this means- "they don't want to do surgery unless they have to". She shared with CSW that she has a strong faith and states, "it's all in God's hands".  Patient reports having a good support network including2 brothers and close friends- "I'm not that close to my sisters but my brothers and friends live nearby and are good support".   Assessment/plan status:  Other - See comment Other assessment/ plan:   FL2 and PASRR for SNF   Information/referral to community resources:   SNF list    PATIENT'S/FAMILY'S RESPONSE TO PLAN OF CARE: Patient is agreeable to SNF at d/c if needed- she acknowledges that living alone has made her very independent and thus will need to be able to be safe at home once she returns. She is anxious to determine more in the way of plans for treatment  which could involve surgery. Again, she focused on her faith and seems to be comforted by this- support provided- CSW will follow along to continue to support and assist with dc planning.       Eduard Clos, MSW, [place]

## 2014-10-28 NOTE — Procedures (Signed)
Lumbar puncture performed at L3-L4.  Tap slightly bloody, and failed to completely clear.  Opening pressure 20 cm H2O.  A total of 6 cc of CSF collected and sent to lab for analysis.  No complications.  See full report in PACS.

## 2014-10-29 DIAGNOSIS — R930 Abnormal findings on diagnostic imaging of skull and head, not elsewhere classified: Secondary | ICD-10-CM

## 2014-10-29 DIAGNOSIS — R700 Elevated erythrocyte sedimentation rate: Secondary | ICD-10-CM

## 2014-10-29 DIAGNOSIS — D72828 Other elevated white blood cell count: Secondary | ICD-10-CM

## 2014-10-29 DIAGNOSIS — G4733 Obstructive sleep apnea (adult) (pediatric): Secondary | ICD-10-CM

## 2014-10-29 DIAGNOSIS — R9389 Abnormal findings on diagnostic imaging of other specified body structures: Secondary | ICD-10-CM | POA: Diagnosis present

## 2014-10-29 DIAGNOSIS — R938 Abnormal findings on diagnostic imaging of other specified body structures: Secondary | ICD-10-CM

## 2014-10-29 NOTE — Progress Notes (Signed)
Placed patient on CPAP via FFM, 10.0 cm H20.  Patient tolerating will at this time. Vitals stable.

## 2014-10-29 NOTE — Progress Notes (Signed)
Pt placed on CPAP for the night. Tolerating well at this time. RT will continue to monitor.

## 2014-10-29 NOTE — Progress Notes (Signed)
Subjective: Patient had no new complaints. No overnight adverse.  Objective: Current vital signs: BP 137/69 mmHg  Pulse 76  Temp(Src) 98.3 F (36.8 C) (Oral)  Resp 16  Ht 5\' 6"  (1.676 m)  Wt 124.4 kg (274 lb 4 oz)  BMI 44.29 kg/m2  SpO2 99%  Neurologic Exam: Alert and in no acute distress. Mental status was normal. Extraocular movements were full and conjugate. Visual fields were intact and normal. No facial weakness was noted. Speech was normal with no dysarthria. Motor exam showed normal strength throughout. Coordination of upper extremities was normal.  Lumbar puncture was performed yesterday. Findings were unremarkable except for slightly elevated CSF protein, and cell count results consistent with traumatic tap. Gram stain was negative.  Medications: I have reviewed the patient's current medications.  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 Infectious etiology is unlikely. She has not had a recurrent seizure.  Agree with recommendation for further evaluation with PET scanning, as well oncology radiation therapy consultations. No changes in management of vasogenic edema with Decadron and new onset seizure disorder with Keppra anticipated.  C.R. Nicole Kindred, MD [facility] [phone]  10/29/2014  12:58 PM

## 2014-10-29 NOTE — Progress Notes (Signed)
Results of LP reviewed. Doesn't appear infectious based on profile. Would proceed with PET scan. Should we go ahead and get oncology/ radiation onc on board?

## 2014-10-29 NOTE — Progress Notes (Signed)
[facility] TEAM 1 - Stepdown/ICU TEAM Progress Note  Caitlin Carter:[id number] DOB: 04/03/1946 DOA: 10/23/2014 PCP: Antony Blackbird, MD  Admit HPI / Brief Narrative: Caitlin Carter is a 69 y.o. Female PMHx  depression, HTN, HLD, OSA on CPAP, obesity, Who comes in c/o b/l arm weakness- left > right. This same thing happened 2 weeks ago but resolved on its own. She also has developed worsening SOB and cough. No fever, no chills. Denies weight loss  In the ER, she was hypoxic, requiring 4 L O2 and then progressed to needing non-rebreather. She had a chest x ray that showed Mild congestive heart failure and interstitial edema is suspected. A CT scan of her head was done that showed vasogenic edema from possible infection vs mets Patient has been smoking since she was 69 years old. Wears Cpap at night only  Hospitalist were asked to admit for further work up of COPD/CHF and vasogenic brain edema- will place in SDU due to respiratory status    HPI/Subjective: 2/17 A/O 4, overall no new complaints. She is feeling better. Feels that weakness on left side is getting better. She is no longer having tremors in arms.   Assessment/Plan: COPD exacerbation in a smoker -Continue Levofloxacin x 10 days -Duoneb q 4 hr -Sputum pending -Respiratory viral panel negative -Titrate O2 to maintain SPO2 89%-93% - OSA -CPAP per respiratory  Weakness/stroke like symptom/metastatic or primary neoplasm brain  - CT shows patchy vasogenic edema with possible multi focal metastatic disease vs infection -Decadron 4 mg  q 6hr -CT abdomen pelvis; nondiagnostic for primary neoplasm will need LP. Already scheduled by neurosurgery for the a.m. -results of LP reviewed and it does not appear to show any signs of infection - Consulted Medical Oncology and discussed case with Dr. Alvy Bimler - It was felt that patient may have some lymphadenopathy in chest that may be amenable to biopsy, which would also be less  risk than a brain biopsy - I have discussed case with CCM who will see the patient in consult for possible bronchoscopy - She will need radiation oncology referral in AM.  Elevated troponin -Most likely demand ischemia; troponins trending down  -CT angiogram negative for PE -Echocardiogram showed mild diastolic CHF  Chronic diastolic CHF -Patient was on Lasix 40 mg po  BID, but creatinine is trending up. Will hold lasix for today and reassess labs in am. -Strict in and out; since admission-4.1 liters  -Daily A.m. Weight; admission weight= 129.8 kg         2/17 bed weight= 124.4 kg  Hypokalemia -improved with replacement -Potassium goal>4  Tobacco Abuse -Continue Nicotine patch  Insomnia -Ambien 5 mg    Code Status: FULL Family Communication: no family present at time of exam Disposition Plan: Diagnosis of neoplasm    Consultants: Dr.William Thurman Coyer (cardiology) Dr.David Adah Salvage (neurosurgery) Dr.Peter Marisa Hua (neurology) Dr. Alvy Bimler (medical oncology) CCM    Procedure/Significant Events: 2/11 CXR;Borderline cardiomegaly and bilateral interstitial thickening, increased when compared to the prior chest radiograph.  -Mild congestive heart failure and interstitial edema is suspected 2/11 CT head without contrast;Patchy vasogenic edema, usually seen with multi focal metastatic disease or infection. -Multi focal cortical edema suspicious for superimposed infarcts. 2/11 CT angiogram chest PE protocol;No central pulmonary embolus. -Cardiac enlargement with hazy parenchymal opacities suggesting congestive failure with edema. 2/11 MRI brain with and without contrast;At least 6 infratentorial and greater than 20 is supratentorial metastasis with extensive vasogenic edema resulting in 2 mm RIGHT  to LEFT midline shift.  2/12 CT abdomen pelvis with and without contrast pending;  2/12 2D Echocardiogram; - Left ventricle: mild LVH. -LVEF=  60% to 65%. -(grade 1 diastolic  dysfunction). 2/13 CT abdomen pelvis with contrast;- No primary tumor or metastatic disease identified in the abdomen or pelvis.- Hepatic steatosis.- Diverticulosis of the colon.    Culture 2/12 blood pending 2/12 urine pending 2/12 sputum pending 2/12 respiratory viral panel pending  Antibiotics: Levofloxacin 2/11>>   DVT prophylaxis: Lovenox stopped on 2/14 for LP procedure in the a.m. SCD   Devices    LINES / TUBES:      Continuous Infusions:   Objective: VITAL SIGNS: Temp: 97.9 F (36.6 C) (02/17 1500) Temp Source: Oral (02/17 1500) BP: 142/69 mmHg (02/17 1038) Pulse Rate: 97 (02/17 1038) SPO2; FIO2:   Intake/Output Summary (Last 24 hours) at 10/29/14 1837 Last data filed at 10/29/14 0516  Gross per 24 hour  Intake    360 ml  Output    850 ml  Net   -490 ml     Exam: General: A/O 4, sitting in bed comfortably Lungs: clear to auscultation bilateral  No expiratory wheezing Cardiovascular: Regular rate and rhythm without murmur gallop or rub normal S1 and S2 Abdomen: Morbidly obese, Nontender, nondistended, soft, bowel sounds positive, no rebound, no ascites, no appreciable mass Extremities: No significant cyanosis, clubbing. Bilateral lower extremity edema Rt>> Lt (1+  Data Reviewed: Basic Metabolic Panel:  Recent Labs Lab 10/24/14 0940 10/25/14 0400 10/26/14 0258 10/27/14 0240 10/28/14 0307 10/28/14 2248  NA 139 140 141 141 139 138  K 3.0* 3.7 4.1 4.8 4.3 4.8  CL 103 99 102 103 101 102  CO2 26 33* 32 31 24 27   GLUCOSE 153* 116* 116* 108* 109* 137*  BUN 15 17 25* 27* 38* 41*  CREATININE 0.84 0.83 1.02 1.00 0.97 1.34*  CALCIUM 8.9 9.3 9.4 9.3 9.8 9.2  MG 1.9 2.0 2.0 2.3  --  2.4   Liver Function Tests:  Recent Labs Lab 10/24/14 0940 10/25/14 0400 10/26/14 0258 10/27/14 0240 10/28/14 2248  AST 77* 114* 71* 36 27  ALT 76* 71* 62* 55* 47*  ALKPHOS 66 64 59 60 66  BILITOT 0.5 0.4 0.8 0.7 0.5  PROT 6.6 7.1 7.1 7.0 6.9  ALBUMIN  3.3* 3.5 3.6 3.4* 3.7   No results for input(s): LIPASE, AMYLASE in the last 168 hours. No results for input(s): AMMONIA in the last 168 hours. CBC:  Recent Labs Lab 10/23/14 1542  10/24/14 0940 10/24/14 2245 10/26/14 0258 10/27/14 0240 10/28/14 2248  WBC 13.5*  --  8.3 15.8* 13.5* 11.1* 13.0*  NEUTROABS 12.1*  --   --  13.9*  --   --  10.7*  HGB 14.9  < > 14.3 15.1* 15.4* 16.0* 17.5*  HCT 45.4  < > 43.7 46.3* 48.1* 51.3* 53.6*  MCV 88.8  --  89.7 91.7 91.1 91.3 92.3  PLT 197  --  179 199 205 210 229  < > = values in this interval not displayed. Cardiac Enzymes:  Recent Labs Lab 10/24/14 0323 10/24/14 0810 10/24/14 2245 10/25/14 0400 10/25/14 1200  TROPONINI 1.32* 1.11* 0.66* 0.55* 0.41*   BNP (last 3 results)  Recent Labs  10/23/14 1542  BNP 95.2    ProBNP (last 3 results) No results for input(s): PROBNP in the last 8760 hours.  CBG: No results for input(s): GLUCAP in the last 168 hours.  Recent Results (from the past 240 hour(s))  MRSA PCR Screening     Status: None   Collection Time: 10/24/14  6:41 AM  Result Value Ref Range Status   MRSA by PCR NEGATIVE NEGATIVE Final    Comment:        The GeneXpert MRSA Assay (FDA approved for NASAL specimens only), is one component of a comprehensive MRSA colonization surveillance program. It is not intended to diagnose MRSA infection nor to guide or monitor treatment for MRSA infections.   Respiratory virus panel (routine influenza)     Status: None   Collection Time: 10/24/14 10:15 PM  Result Value Ref Range Status   Respiratory Syncytial Virus A Negative Negative Final   Respiratory Syncytial Virus B Negative Negative Final   Influenza A Negative Negative Final   Influenza B Negative Negative Final   Parainfluenza 1 Negative Negative Final   Parainfluenza 2 Negative Negative Final   Parainfluenza 3 Negative Negative Final   Metapneumovirus Negative Negative Final   Rhinovirus Negative Negative Final     Adenovirus Negative Negative Final    Comment: (NOTE) Performed At: [facility] [place], [place] [id number] Lindon Romp MD IW:[id number]   Culture, blood (routine x 2)     Status: None (Preliminary result)   Collection Time: 10/24/14 10:40 PM  Result Value Ref Range Status   Specimen Description BLOOD RIGHT ARM  Final   Special Requests BOTTLES DRAWN AEROBIC ONLY 4CC  Final   Culture   Final           BLOOD CULTURE RECEIVED NO GROWTH TO DATE CULTURE WILL BE HELD FOR 5 DAYS BEFORE ISSUING A FINAL NEGATIVE REPORT Performed at [facility]    Report Status PENDING  Incomplete  Culture, blood (routine x 2)     Status: None (Preliminary result)   Collection Time: 10/24/14 10:45 PM  Result Value Ref Range Status   Specimen Description BLOOD LEFT HAND  Final   Special Requests BOTTLES DRAWN AEROBIC ONLY 5CC  Final   Culture   Final           BLOOD CULTURE RECEIVED NO GROWTH TO DATE CULTURE WILL BE HELD FOR 5 DAYS BEFORE ISSUING A FINAL NEGATIVE REPORT Performed at [facility]    Report Status PENDING  Incomplete  Culture, expectorated sputum-assessment     Status: None   Collection Time: 10/25/14 12:37 AM  Result Value Ref Range Status   Specimen Description INDUCED SPUTUM  Final   Special Requests NONE  Final   Sputum evaluation   Final    MICROSCOPIC FINDINGS SUGGEST THAT THIS SPECIMEN IS NOT REPRESENTATIVE OF LOWER RESPIRATORY SECRETIONS. PLEASE RECOLLECT. CALLED TO H.DILLON RN 0139 10/25/14 E.[email or web address]    Report Status 10/25/2014 FINAL  Final  Culture, respiratory (NON-Expectorated)     Status: None   Collection Time: 10/25/14 12:37 AM  Result Value Ref Range Status   Specimen Description INDUCED SPUTUM  Final   Special Requests Normal  Final   Gram Stain   Final    RARE WBC PRESENT, PREDOMINANTLY PMN FEW SQUAMOUS EPITHELIAL CELLS PRESENT MODERATE GRAM POSITIVE COCCI IN PAIRS MODERATE GRAM POSITIVE RODS MODERATE GRAM NEGATIVE  RODS    Culture   Final    NORMAL OROPHARYNGEAL FLORA Performed at [facility]    Report Status 10/27/2014 FINAL  Final  Culture, Urine     Status: None   Collection Time: 10/25/14  6:45 AM  Result Value Ref Range Status   Specimen Description URINE, CLEAN  CATCH  Final   Special Requests NONE  Final   Colony Count   Final    8,000 COLONIES/ML Performed at [facility]    Culture   Final    INSIGNIFICANT GROWTH Performed at [facility]    Report Status 10/26/2014 FINAL  Final     Studies:  Recent x-ray studies have been reviewed in detail by the Attending Physician  Scheduled Meds:  Scheduled Meds: . aspirin EC  81 mg Oral Daily  . calcium-vitamin D  1 tablet Oral Q breakfast  . dexamethasone  4 mg Intravenous 4 times per day  . furosemide  40 mg Oral BID  . ipratropium-albuterol  3 mL Nebulization TID  . levETIRAcetam  500 mg Oral BID  . nicotine  21 mg Transdermal Daily  . pantoprazole  40 mg Oral Q1200  . PARoxetine  20 mg Oral Daily  . potassium chloride  20 mEq Oral Daily  . simvastatin  40 mg Oral q1800    Time spent on care of this patient: 30 mins   [place] ,   [facility] Office  [phone] Pager - [phone]  On-Call/Text Page:      [email or web address]      password [email or web address]  If 7PM-7AM, please contact night-coverage [email or web address] Password [email or web address] 10/29/2014, 6:37 PM   LOS: 6 days

## 2014-10-29 NOTE — Consult Note (Addendum)
Name: Caitlin Carter MRN: [id number] DOB: [birth year]    ADMISSION DATE:  10/23/2014 CONSULTATION DATE:  10/30/2014  REFERRING MD :  Roderic Palau  CHIEF COMPLAINT:  Lt arm shaking  BRIEF PATIENT DESCRIPTION:  69 yo female smoker presented with Lt arm shaking, and COPD exacerbation.  CT head showed vasogenic edema with concern for metastatic lesions.  CT chest showed mediastinal LAN.  PCCM asked to assess for bronchoscopy.  SIGNIFICANT EVENTS  2/11 - admit, neurology consulted 2/12 - cardiology consulted for elevated troponin 2/14 - neurosurgery consulted 2/17 - oncology consulted  STUDIES:  2/11 CT Head >>> patchy vasogenic edema 2/12 MRI Brain >>> at least 6 inratentorial and > 20 supratentorial metastasis with extensive vasogenic edema resulting in 22mm R to L MLS. 2/12 CTA Chest >>> mediastinal and Lt hilar LAN 2/12 Echo >> EF 60 to 60%, grade 1 diastolic dysfx 1/09 CT abd / pelv >>> no primary tumor or metastatic disease, hepatic steatosis, diverticulosis. 2/16 LP >>> glucose 63, protein 56, RBC 6925, WBC 7, cytology negative   HISTORY OF PRESENT ILLNESS:  Caitlin Carter is a 69 y.o. F with PMH as outlined below.  Caitlin Carter was admitted on 10/23/14 with COPD and CHF exacerbation.  On admission, Caitlin Carter had complained of B arm weakness x 2 weeks, this had occurred before but resolved on it's own. CT Head was obtained and showed patchy vasogenic edema.  MRI was obtained and revealed multiple lesions concerning for metastasis.  Caitlin Carter had LP performed which was negative for infection. CTA of the chest on 2/12 was negative for PE but did reveal some lymphadenopathy without any primary source of malignancy noted.  CT abdomen was also negative for malignant findings. PCCM was consulted 2/16 for possible EBUS to obtain tissue sample for definitive diagnosis.  Caitlin Carter does endorse a ~ 23 yr smoking hx with roughly 1/2 ppd.  Has had recent mammogram (all have been negative).  Last colonoscopy 6 - 7 years ago with  benign polyps.  No hx of any cancers.  PAST MEDICAL HISTORY :   has a past medical history of HTN (hypertension); Obesity; HLD (hyperlipidemia); and Depression.  has past surgical history that includes Replacement total knee. Prior to Admission medications   Medication Sig Start Date End Date Taking? Authorizing Provider  aspirin 81 MG tablet Take 81 mg by mouth daily.   Yes Historical Provider, MD  calcium-vitamin D (OSCAL WITH D) 500-200 MG-UNIT per tablet Take 1 tablet by mouth daily with breakfast.   Yes Historical Provider, MD  Garlic 10 MG CAPS Take 10 mg elemental calcium/kg/hr by mouth daily.   Yes Historical Provider, MD  lisinopril-hydrochlorothiazide (PRINZIDE,ZESTORETIC) 20-25 MG per tablet Take 1 tablet by mouth daily.   Yes Historical Provider, MD  Melatonin 10 MG CAPS Take 10 mg by mouth at bedtime.   Yes Historical Provider, MD  Omega-3 Fatty Acids (FISH OIL) 1000 MG CAPS Take 1,000 mg by mouth daily.   Yes Historical Provider, MD  PARoxetine (PAXIL) 20 MG tablet Take 20 mg by mouth daily.   Yes Historical Provider, MD  simvastatin (ZOCOR) 40 MG tablet Take 40 mg by mouth daily.   Yes Historical Provider, MD   No Known Allergies  FAMILY HISTORY:  family history includes CAD in an other family member. SOCIAL HISTORY:  reports that Caitlin Carter has been smoking.  Caitlin Carter does not have any smokeless tobacco history on file.  REVIEW OF SYSTEMS:   All negative; except for those that are bolded, which  indicate positives.  Constitutional: weight loss, weight gain, night sweats, fevers, chills, fatigue, weakness.  HEENT: headaches, sore throat, sneezing, nasal congestion, post nasal drip, difficulty swallowing, tooth/dental problems, visual complaints, visual changes, ear aches. Neuro: difficulty with speech, weakness, numbness, ataxia. CV:  chest pain, orthopnea, PND, swelling in lower extremities, dizziness, palpitations, syncope.  Resp: cough, hemoptysis, dyspnea, wheezing. GI   heartburn, indigestion, abdominal pain, nausea, vomiting, diarrhea, constipation, change in bowel habits, loss of appetite, hematemesis, melena, hematochezia.  GU: dysuria, change in color of urine, urgency or frequency, flank pain, hematuria. MSK: joint pain or swelling, decreased range of motion. Psych: change in mood or affect, depression, anxiety, suicidal ideations, homicidal ideations. Skin: rash, itching, bruising.   SUBJECTIVE:  Feels good tonight, denies chest pain, SOB.  Is in good spirits despite bad news regarding findings on CT and MRI imaging.  VITAL SIGNS: Temp:  [97.8 F (36.6 C)-98.3 F (36.8 C)] 98.2 F (36.8 C) (02/18 0700) Pulse Rate:  [59-97] 59 (02/18 0459) Resp:  [11-23] 18 (02/17 2349) BP: (116-142)/(46-72) 120/46 mmHg (02/18 0459) SpO2:  [90 %-99 %] 97 % (02/18 0759) Weight:  [274 lb 0.5 oz (124.3 kg)] 274 lb 0.5 oz (124.3 kg) (02/18 0500)  PHYSICAL EXAMINATION: General: Pleasant obese female, resting in bed, in NAD. Neuro: A&O x 3, non-focal.  HEENT: [place]/AT. PERRL, sclerae anicteric. Cardiovascular: RRR, no M/R/G.  Lungs: Respirations even and unlabored.  CTA bilaterally, No W/R/R. Abdomen: Obese, BS x 4, soft, NT/ND.  Musculoskeletal: No gross deformities, no edema.  Skin: Intact, warm, no rashes.     Recent Labs Lab 10/28/14 0307 10/28/14 2248 10/30/14 0330  NA 139 138 138  K 4.3 4.8 4.9  CL 101 102 106  CO2 24 27 23   BUN 38* 41* 30*  CREATININE 0.97 1.34* 0.84  GLUCOSE 109* 137* 102*    Recent Labs Lab 10/27/14 0240 10/28/14 2248 10/30/14 0330  HGB 16.0* 17.5* 17.7*  HCT 51.3* 53.6* 52.5*  WBC 11.1* 13.0* 12.5*  PLT 210 229 208   Dg Fluoro Guide Lumbar Puncture  10/28/2014   Vinnie Langton, MD     10/28/2014  1:35 PM Lumbar puncture performed at L3-L4.  Tap slightly bloody, and  failed to completely clear.  Opening pressure 20 cm H2O.  A total  of 6 cc of CSF collected and sent to lab for analysis.  No  complications.  See full  report in PACS.   ASSESSMENT / PLAN:  Mediastinal lymphadenopathy and left hilar adenopathy concerning for metastatic disease, but could be reactive in setting of AECOPD. Plan: Caitlin Carter will likely need bronchoscopy with EBUS >> procedure d/w pt and risks detailed as bleeding, infection, PTX, and non diagnosis EBUS can be set up as outpt >> would be helpful to have PET scan done first  AECOPD with acute hypoxic respiratory failure Plan: Decadron Prn duoneb Will need PFT's as outpt Oxygen to keep SpO2 > 92%  Hx of OSA. Plan: CPAP qhs  Metastatic appearing brain lesions. Plan: Continue decadron, AED's per neurology  Elevated troponin. Acute on chronic diastolic CHF. HLD. Plan: Per primary team F/u with cardiology as outpt  Tobacco abuse. 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 Plan: F/u CBC  Montey Hora, PA - [facility] Pager: [phone]  or [phone] 10/30/2014, 9:15 AM  Reviewed above, examined.  Caitlin Carter presented with Lt arm shaking/weakness and dyspnea.  Caitlin Carter was tx for AECOPD.  Caitlin Carter was found to have multiple lesions on CT head and MRI brain concerning for metastatic lesions.  Caitlin Carter had CT chest which showed borderline mediastinal and Lt hilar LAN.  Caitlin Carter has extensive hx of smoking.    Reviewed her CT chest findings with her.  Explained Caitlin Carter will need bronchoscopy with endobronchial u/s to further assess whether Caitlin Carter has malignancy in her lymph nodes.  This can be done as an outpt.  Would be helpful to have PET scan done first.  I have scheduled her for outpt pulmonary follow up visit with Dr. Lamonte Sakai on Thursday, March 3 at 2 pm.  Please ensure Caitlin Carter has PET scan done prior to this as outpt.  Chesley Mires, MD [facility] Pulmonary/Critical Care 10/30/2014, 9:32 AM Pager:  [phone] After 3pm call: [phone]

## 2014-10-29 NOTE — Consult Note (Signed)
[place]  Telephone:(336) [place]                                MR#: [id number]  DOB: 12-17-1945                                 CSN#: [id number]  Referring MD:  [facility] Primary MD: Dr. Chapman Fitch  Reason for Consult: Metastatic Brain lesions I have seen the patient, examined her and edited the notes as follows  DXA:JOINOMV B Paull is a 69 y.o. female admitted on 10/23/14 with COPD and Congestive heart failure exacerbation. She was short of breath which progressed, complicated with hypoxia requiring non rebreather mask. She denied chest pain. She denied any abdominal pain, nausea or vomiting.   On admission, she had complained of intermittent  bilateral arm weakness for 2 weeks. No confusion or seizures were reported. She denied any vision changes or double vision. She denied any headaches. She denied any gait disturbance. She denies dysarthria. No dysphagia reported. Patient reports no prior history of seizures. Patient denies any history of malignancy. She denies any history of melanoma. She continues to smoke. She denies any alcohol or recreational drug use. She is up to date with screening tests and physicals. Her last mammogram was in January of 2016, normal.She had a colonoscopy in 02/2007 due to polyps, which were benign.   CT head without contrast on 2/11 showed patchy vasogenic edema, usually seen with multi focal metastatic disease or infection. There were also multi focal cortical edema suspicious for superimposed infarcts. 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 She was placed on IV decadron, keppra. Lumbar puncture was negative for infection.   CT angio chest on 2/12 was negative for pulmonary embolus or malignant lesions.  Some lymphadenopathy is seen but no primary source of malignancy was noted. CT of the abdomen or pelvis on 2/13 were negative for acute malignant findings. Hepatic steatosis was noted.    Neurosurgical consultation was obtained on 2/14,  Entertaining possible craniotomy for resection of one of the lesions for tissue diagnosis. PET scan was highly recommended bu this is not performed as inpatient.  Radiation consultation to be obtained.  Oncology consultation was requested to see this patient with recommendations. Overall she felt better since admission.       PMH:  Past Medical History  Diagnosis Date  . HTN (hypertension)   . Obesity   . HLD (hyperlipidemia)   . Depression   History of seborrheic keratosis  Surgeries:  Past Surgical History  Procedure Laterality Date  . Replacement total knee    Colonoscopy with snare polypectomy x1 and cold biopsies x6.             Dr. Collene Mares 02/11/07  Allergies: No Known Allergies  Medications:   Prior to Admission:  Prescriptions prior to admission  Medication Sig Dispense Refill Last Dose  . aspirin 81 MG tablet Take 81 mg by mouth daily.   10/23/2014 at Unknown time  . calcium-vitamin D (OSCAL WITH D) 500-200 MG-UNIT per tablet Take 1 tablet by mouth daily with breakfast.  10/23/2014 at Unknown time  . Garlic 10 MG CAPS Take 10 mg elemental calcium/kg/hr by mouth daily.   10/23/2014 at Unknown time  . lisinopril-hydrochlorothiazide (PRINZIDE,ZESTORETIC) 20-25 MG per tablet Take 1 tablet by mouth daily.   10/23/2014 at Unknown time  . Melatonin 10 MG CAPS Take 10 mg by mouth at bedtime.   10/22/2014 at Unknown time  . Omega-3 Fatty Acids (FISH OIL) 1000 MG CAPS Take 1,000 mg by mouth daily.   10/23/2014 at Unknown time  . PARoxetine (PAXIL) 20 MG tablet Take 20 mg by mouth daily.   10/22/2014 at Unknown time  . simvastatin (ZOCOR) 40 MG tablet Take 40 mg by mouth daily.   10/22/2014 at Unknown time    NAT:FTDDUKGURKYHC, albuterol, diphenhydrAMINE,  ondansetron, senna-docusate, zolpidem  ROS: Constitutional: Denies fevers, chills or abnormal night sweats Eyes: Denies blurriness of vision, double vision or watery eyes Ears, nose, mouth, throat, and face: Denies mucositis or sore throat Respiratory:  She had shortness of breath on presentation requiring a non rebreather mask Cardiovascular: Denies palpitation, chest discomfort or lower extremity swelling Gastrointestinal:  Denies nausea, heartburn or change in bowel habits Skin: Denies abnormal skin rashes Lymphatics: Denies new lymphadenopathy or easy bruising Neurological: she reports bilateral upper extremity numbness and clumsiness Behavioral/Psych: Mood is stable, no new changes  All other systems were reviewed with the patient and are negative.    Family History:    Family History  Problem Relation Age of Onset  . CAD     Father and Brother died with lung Cancer Mother died with Breast Cancer Maternal grandmother died with breast cancer  No family history of hematological  disorders.  Social History:  reports that she has been smoking.  She does not have any smokeless tobacco history on file. Her alcohol and drug histories are not on file. . Widowed.     Physical Exam    Filed Vitals:   10/29/14 1100  BP:   Pulse:   Temp: 98.3 F (36.8 C)  Resp:    Filed Weights   10/27/14 0500 10/28/14 0512 10/29/14 0500  Weight: 277 lb 12.5 oz (126 kg) 274 lb 4 oz (124.4 kg) 274 lb 4 oz (124.4 kg)    GENERAL:alert, no distress and comfortable. She is morbidly obese SKIN: skin color, texture, turgor are normal, no rashes or significant lesions. She has some seborrheic keratosis.  EYES:left ptosis noted.  conjunctiva are pink and non-injected, sclera clear OROPHARYNX: no exudate, no erythema and lips, buccal mucosa, and tongue normal  NECK: supple, thyroid normal size, non-tender, without nodularity LYMPH:  no palpable lymphadenopathy in the cervical, axillary or  inguinal LUNGS: clear to auscultation and percussion with normal breathing effort HEART: regular rate & rhythm and no murmurs and no lower extremity edema ABDOMEN: morbidly obese, soft, non-tender and normal bowel sounds Musculoskeletal:no cyanosis of digits and no clubbing  PSYCH: alert & oriented x 3 with fluent speech NEURO: decreased sensation in upper digits. Left ptosis.  Bilateral breast exams were negative Labs:  CBC   Recent Labs Lab 10/23/14 1542  10/24/14 0940 10/24/14 2245 10/26/14 0258 10/27/14 0240 10/28/14 2248  WBC 13.5*  --  8.3 15.8* 13.5* 11.1* 13.0*  HGB 14.9  < > 14.3 15.1* 15.4* 16.0* 17.5*  HCT 45.4  < > 43.7 46.3* 48.1* 51.3* 53.6*  PLT 197  --  179 199 205 210 229  MCV 88.8  --  89.7 91.7 91.1 91.3 92.3  MCH 29.2  --  29.4  29.9 29.2 28.5 30.1  MCHC 32.8  --  32.7 32.6 32.0 31.2 32.6  RDW 14.3  --  14.2 14.4 14.7 14.7 14.7  LYMPHSABS 0.9  --   --  1.0  --   --  1.3  MONOABS 0.5  --   --  0.9  --   --  1.0  EOSABS 0.0  --   --  0.0  --   --  0.0  BASOSABS 0.0  --   --  0.0  --   --  0.0  < > = values in this interval not displayed.   CMP    Recent Labs Lab 10/24/14 0940 10/25/14 0400 10/26/14 0258 10/27/14 0240 10/28/14 0307 10/28/14 2248  NA 139 140 141 141 139 138  K 3.0* 3.7 4.1 4.8 4.3 4.8  CL 103 99 102 103 101 102  CO2 26 33* 32 31 24 27   GLUCOSE 153* 116* 116* 108* 109* 137*  BUN 15 17 25* 27* 38* 41*  CREATININE 0.84 0.83 1.02 1.00 0.97 1.34*  CALCIUM 8.9 9.3 9.4 9.3 9.8 9.2  MG 1.9 2.0 2.0 2.3  --  2.4  AST 77* 114* 71* 36  --  27  ALT 76* 71* 62* 55*  --  47*  ALKPHOS 66 64 59 60  --  66  BILITOT 0.5 0.4 0.8 0.7  --  0.5        Component Value Date/Time   BILITOT 0.5 10/28/2014 2248      Recent Labs Lab 10/23/14 1542  INR 0.99    No results for input(s): DDIMER in the last 72 hours.   Anemia panel:  No results for input(s): VITAMINB12, FOLATE, FERRITIN, TIBC, IRON, RETICCTPCT in the last 72  hours.   Imaging Studies:I have reviewed the imaging myself  Dg Chest 2 View  10/23/2014   CLINICAL DATA:  Pt states that she is very SOB and has been congested for the past week now, wheezing.  EXAM: CHEST  2 VIEW  COMPARISON:  01/09/2007  FINDINGS: Cardiac silhouette borderline enlarged. No mediastinal or hilar masses or evidence of adenopathy.  There is bilateral interstitial thickening. No areas of lung consolidation is seen. No pleural effusion or pneumothorax.  Bony thorax is diffusely demineralized but grossly intact.  IMPRESSION: Borderline cardiomegaly and bilateral interstitial thickening. Interstitial thickening may all be chronic, but has increased when compared to the prior chest radiograph. Mild congestive heart failure and interstitial edema is suspected.   Electronically Signed   By: Lajean Manes M.D.   On: 10/23/2014 16:50   Ct Head Wo Contrast  10/23/2014   CLINICAL DATA:  Right upper extremity weakness.  EXAM: CT HEAD WITHOUT CONTRAST  TECHNIQUE: Contiguous axial images were obtained from the base of the skull through the vertex without intravenous contrast.  COMPARISON:  None.  FINDINGS: Skull and Sinuses:No evidence of bone metastasis. The mastoids and sinuses are clear.  Orbits: No acute abnormality.  Brain: There is diffuse abnormality of the cerebral hemispheres with asymmetric white matter low density that appears to have mass effect, effacing associated sulci. This is confluent throughout the right centrum semiovale and subcortical white matter, more patchy on the left. The appearance favors vasogenic edema as seen with tumor or infection. There are also areas of small cortical low-density in the lateral right temporal lobe, parasagittal left frontal lobe, and left parietal lobe. There is no midline shift, hydrocephalus, or acute hemorrhage.  These results were called by telephone at the  time of interpretation on 10/23/2014 at 4:59 pm to Dr. Ernestina Patches , who verbally  acknowledged these results.  IMPRESSION: Patchy vasogenic edema, usually seen with multi focal metastatic disease or infection. There is also multi focal cortical edema suspicious for superimposed infarcts. Brain MRI with contrast is recommended.   Electronically Signed   By: Monte Fantasia M.D.   On: 10/23/2014 17:04   Ct Angio Chest Pe W/cm &/or Wo Cm  10/24/2014   ADDENDUM REPORT: 10/24/2014 01:23  ADDENDUM: There is lymphadenopathy in the mediastinum involving the pretracheal, subcarinal, and left aortopulmonic window regions. Possible adenopathy in the left hilum. Appearance worrisome for metastases.   Electronically Signed   By: Lucienne Capers M.D.   On: 10/24/2014 01:23   10/24/2014   CLINICAL DATA:  Shortness of breath for 2 days, worse this morning.  EXAM: CT ANGIOGRAPHY CHEST WITH CONTRAST  TECHNIQUE: Multidetector CT imaging of the chest was performed using the standard protocol during bolus administration of intravenous contrast. Multiplanar CT image reconstructions and MIPs were obtained to evaluate the vascular anatomy.  CONTRAST:  164mL OMNIPAQUE IOHEXOL 350 MG/ML SOLN  COMPARISON:  None.  FINDINGS: Technically adequate study with moderately good opacification of the central and proximal segmental pulmonary arteries. Contrast bolus is somewhat limited and there is motion artifact, which limits evaluation of peripheral pulmonary arteries. No filling defects demonstrated suggesting no evidence of significant central pulmonary embolus.  Mild cardiac enlargement. Normal caliber thoracic aorta. Aortic calcification. Great vessel origins appear patent. Coronary artery calcifications. Esophagus is decompressed. No significant lymphadenopathy in the chest.  Evaluation of lungs is limited due to respiratory motion artifact. There appears to be patchy airspace disease in the lungs which may indicate edema or multifocal pneumonia. No pleural effusions. No pneumothorax. Airways appear patent although the  lower trachea is somewhat narrowed.  Included portions of the upper abdominal organs are grossly unremarkable. Degenerative changes throughout the spine. No destructive bone lesions.  Review of the MIP images confirms the above findings.  IMPRESSION: No evidence of significant central pulmonary embolus. Peripheral vessels are not well demonstrated. Cardiac enlargement with hazy parenchymal opacities suggesting congestive failure with edema.  Electronically Signed: By: Lucienne Capers M.D. On: 10/23/2014 19:32   Mr Jeri Cos DP Contrast  10/24/2014   CLINICAL DATA:  Intermittent LEFT upper extremity thickening and seizure like symptoms that spontaneously resolve. Follow-up abnormal CT head.  EXAM: MRI HEAD WITHOUT AND WITH CONTRAST  TECHNIQUE: Multiplanar, multiecho pulse sequences of the brain and surrounding structures were obtained without and with intravenous contrast.  CONTRAST:  49mL MULTIHANCE GADOBENATE DIMEGLUMINE 529 MG/ML IV SOLN  COMPARISON:  CT head October 23, 2014  FINDINGS: Moderately motion degraded examination.  At least 6 inferred tentorial and greater than 20 supratentorial rim enhancing lesions at the gray-white matter junction, largest measuring up to 14 mm. 7 mm lesion in RIGHT thalamus. 10 mm lesion in LEFT posterior pons at the level of the superior peduncle. Many of the lesions show slight reduced diffusion. Limited assessment for blood products due to severely motion degraded susceptibility weighted imaging. Lesion show no intrinsic T1 shortening. Lesions are T2 bright with extends is associated vasogenic edema. 2 mm RIGHT to LEFT midline shift.  No hydrocephalus. No abnormal extra-axial fluid collections nor axial masses. No abnormal extra-axial enhancement the motion degrades sensitivity. Normal major intracranial vascular flow voids seen at the skull base.  Ocular globes and orbital contents are unremarkable. Paranasal sinuses and mastoid air cells are well aerated.  IMPRESSION: At  least 6 infratentorial and greater than 20 is supratentorial metastasis with extensive vasogenic edema resulting in 2 mm RIGHT to LEFT midline shift. Consider correlation with CSF sampling, and possible PET-CT to assess for primary, as clinically indicated.   Electronically Signed   By: Elon Alas   On: 10/24/2014 01:02   Ct Abdomen Pelvis W Contrast  10/25/2014   CLINICAL DATA:  69 year old female with abnormal brain MRI suggesting metastatic disease. Subsequent encounter.  EXAM: CT ABDOMEN AND PELVIS WITH CONTRAST  TECHNIQUE: Multidetector CT imaging of the abdomen and pelvis was performed using the standard protocol following bolus administration of intravenous contrast.  CONTRAST:  165mL OMNIPAQUE IOHEXOL 300 MG/ML  SOLN  COMPARISON:  Chest CTA 10/23/14.  Brain MRI 10/23/14.  FINDINGS: Negative lung bases.   No acute osseous abnormality identified.  No pelvic free fluid. Negative uterus, adenexa, bladder, rectum. Severe Sigmoid diverticulosis, continues less pronounced to the splenic flexure. No active inflammation. Oral contrast at the hepatic flexure. Negative more proximal colon. No dilated small bowel.  Negative stomach, duodenum, gallbladder, spleen, pancreas, adrenal glands. Negative kidneys; mild calcified atherosclerosis.  There is some hepatic steatosis.  Otherwise negative liver.  No free fluid. Aortoiliac calcified atherosclerosis noted. Major vascular structures are patent. No mass or lymphadenopathy.  IMPRESSION: 1. No primary tumor or metastatic disease identified in the abdomen or pelvis.  2.  Hepatic steatosis.  3.  Diverticulosis of the colon.   Electronically Signed   By: Genevie Ann M.D.   On: 10/25/2014 01:40   Dg Fluoro Guide Lumbar Puncture  10/28/2014   Vinnie Langton, MD     10/28/2014  1:35 PM Lumbar puncture performed at L3-L4.  Tap slightly bloody, and  failed to completely clear.  Opening pressure 20 cm H2O.  A total  of 6 cc of CSF collected and sent to lab for analysis.  No   complications.  See full report in PACS.     Assessment/Plan: 69 y.o.   Brain lesions Patient was admitted with bilateral arm numbness and stiffness (?seizure) CT head was remarkable for patchy vasogenic edema suspicious for multi focal metastatic disease or infection. There were also multi focal cortical edema suspicious for superimposed infarcts.  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  She was placed on IV decadron, keppra with improvement of symptoms. Recommend continue the same CT of the chest showed some lymphadenopathy; abdomen and pelvis did not reveal any other abnormal deposit of tumor Lumbar puncture was negative for infection.  She may need a PET scan to rule out occult metastatic disease.  Radiation Oncology evaluation is pending. Overall, I felt the most likely place to yield a biopsy is through her lung. I have discussed this with the hospitalist who will arrange pulmonary consultation.  Erythrocytosis This is  Likely related to her COPD and tobacco abuse Monitor counts closely  Leukocytosis This is due to Decadron, history of tobacco, COPD No intervention is indicated at this time Will continue to monitor  DVT prophylaxis  Currently on Aspirin Lovenox on hold in anticipation of possible biopsy.  Full Code  Other medical issues as per admitting team   Rondel Jumbo, PA-C 10/29/2014 2:45 PM  [place], Annel Zunker, MD 10/29/2014

## 2014-10-30 ENCOUNTER — Other Ambulatory Visit: Payer: Self-pay | Admitting: Hematology and Oncology

## 2014-10-30 ENCOUNTER — Telehealth: Payer: Self-pay | Admitting: Hematology and Oncology

## 2014-10-30 DIAGNOSIS — I5032 Chronic diastolic (congestive) heart failure: Secondary | ICD-10-CM | POA: Diagnosis present

## 2014-10-30 DIAGNOSIS — Z9989 Dependence on other enabling machines and devices: Secondary | ICD-10-CM

## 2014-10-30 DIAGNOSIS — C7931 Secondary malignant neoplasm of brain: Secondary | ICD-10-CM | POA: Diagnosis present

## 2014-10-30 DIAGNOSIS — C801 Malignant (primary) neoplasm, unspecified: Principal | ICD-10-CM

## 2014-10-30 DIAGNOSIS — G4733 Obstructive sleep apnea (adult) (pediatric): Secondary | ICD-10-CM | POA: Diagnosis present

## 2014-10-30 LAB — CBC
HCT: 52.5 % — ABNORMAL HIGH (ref 36.0–46.0)
Hemoglobin: 17.7 g/dL — ABNORMAL HIGH (ref 12.0–15.0)
MCH: 30.2 pg (ref 26.0–34.0)
MCHC: 33.7 g/dL (ref 30.0–36.0)
MCV: 89.6 fL (ref 78.0–100.0)
Platelets: 208 10*3/uL (ref 150–400)
RBC: 5.86 MIL/uL — ABNORMAL HIGH (ref 3.87–5.11)
RDW: 14.5 % (ref 11.5–15.5)
WBC: 12.5 10*3/uL — ABNORMAL HIGH (ref 4.0–10.5)

## 2014-10-30 LAB — BASIC METABOLIC PANEL
Anion gap: 9 (ref 5–15)
BUN: 30 mg/dL — ABNORMAL HIGH (ref 6–23)
CALCIUM: 9.1 mg/dL (ref 8.4–10.5)
CO2: 23 mmol/L (ref 19–32)
CREATININE: 0.84 mg/dL (ref 0.50–1.10)
Chloride: 106 mmol/L (ref 96–112)
GFR calc Af Amer: 81 mL/min — ABNORMAL LOW (ref >90)
GFR, EST NON AFRICAN AMERICAN: 70 mL/min — AB (ref >90)
Glucose, Bld: 102 mg/dL — ABNORMAL HIGH (ref 70–99)
Potassium: 4.9 mmol/L (ref 3.5–5.1)
SODIUM: 138 mmol/L (ref 135–145)

## 2014-10-30 MED ORDER — ALBUTEROL SULFATE HFA 108 (90 BASE) MCG/ACT IN AERS: 1.0000 | INHALATION_SPRAY | RESPIRATORY_TRACT | Status: DC | PRN

## 2014-10-30 MED ORDER — DEXAMETHASONE 4 MG PO TABS: 4.0000 mg | ORAL_TABLET | Freq: Four times a day (QID) | ORAL | Status: DC

## 2014-10-30 MED ORDER — PANTOPRAZOLE SODIUM 40 MG PO TBEC: 40.0000 mg | DELAYED_RELEASE_TABLET | Freq: Every day | ORAL | Status: DC

## 2014-10-30 MED ORDER — IPRATROPIUM-ALBUTEROL 0.5-2.5 (3) MG/3ML IN SOLN: 3.0000 mL | Freq: Four times a day (QID) | RESPIRATORY_TRACT | Status: DC | PRN

## 2014-10-30 MED ORDER — ZOLPIDEM TARTRATE 5 MG PO TABS: 5.0000 mg | ORAL_TABLET | Freq: Every evening | ORAL | Status: DC | PRN

## 2014-10-30 MED ORDER — LEVETIRACETAM 500 MG PO TABS: 500.0000 mg | ORAL_TABLET | Freq: Two times a day (BID) | ORAL | Status: DC

## 2014-10-30 MED ORDER — ALBUTEROL SULFATE (2.5 MG/3ML) 0.083% IN NEBU: 3.0000 mL | INHALATION_SOLUTION | RESPIRATORY_TRACT | Status: DC | PRN

## 2014-10-30 MED ORDER — NICOTINE 21 MG/24HR TD PT24: 21.0000 mg | MEDICATED_PATCH | Freq: Every day | TRANSDERMAL | Status: DC

## 2014-10-30 NOTE — Discharge Summary (Signed)
Physician Discharge Summary  Caitlin Carter AOZ:[id number] DOB: 1946-08-04 DOA: 10/23/2014  PCP: Antony Blackbird, MD  Admit date: 10/23/2014 Discharge date: 10/30/2014  Time spent: 40 minutes  Recommendations for Outpatient Follow-up:  COPD exacerbation in a smoker -Continue Levofloxacin x 10 days -Albuterol q 4 hr PRN - OSA -CPAP  -Follow-up with pulmonology  Weakness/stroke like symptom/metastatic or primary neoplasm brain  - CT shows patchy vasogenic edema with possible multi focal metastatic disease vs infection -Decadron 4 mg q 6hr -CT abdomen pelvis; nondiagnostic for primary neoplasm -LP nondiagnostic for neoplasm -CT chest PE protocol addendum showedlymphadenopathy see results below -Dr.Ni Gorsuch (oncology) has scheduled PET scan -Per Dr.Charles Nicole Kindred (neurology) he has scheduled LP with IR today  Neoplasm unknown primary -CT chest PE protocol addendum showed lymphadenopathy see results below -PCCM to perform diagnostic bronchoscopy -Dr.Ni Gorsuch (oncology), on board XRT vs chemotherapy -Continue Decadron for edema  Elevated troponin -Most likely demand ischemia; troponins trending down  -CT angiogram negative for PE -Echocardiogram showed mild diastolic CHF  Chronic diastolic CHF -Strict in and out; since admission-4.5 liters  -Daily A.m. Weight; admission weight= 129.8 kg 2/18 bed weight= 124.3 kg -Patient aware self when she is discharged home tonight this will be her dry weight, patient to weigh herself each morning and record in logbook  -PCP to monitor patient's weight and make adjustments restart diuretics, monitor electrolytes.  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 Female PMHx depression, HTN, HLD, OSA on CPAP, obesity, Who comes in c/o b/l arm weakness- left > right. This same thing happened 2 weeks ago but resolved on its own. She also has developed worsening SOB and cough. No fever, no chills. Denies weight loss  In the ER, she was hypoxic, requiring 4 L O2 and then progressed to needing non-rebreather. She had a chest x ray that showed Mild congestive heart failure and interstitial edema is suspected. A CT scan of her head was done that showed vasogenic edema from possible infection vs mets Patient has been smoking since she was 69 years old. Wears Cpap at night only Hospitalist were asked to admit for further work up of COPD/CHF and vasogenic brain edema During patient's workup she was found to have multiple lesions in the brain most consistent with metastatic neoplasm. Patient had CT of chest, abdomen and pelvis which did not show specific primary source however CT of  chest did show some mediastinal and left hilar lymphadenopathy. Secondary to the multiple brain lesions patient initially entered the hospital with altered mental status was placed on high-dose Decadron and has now returned to baseline of A/O 4. Patient also came in with respiratory distress  secondary to COPD exacerbation, and was treated with antibiotics, steroids, and bronchodilators and is now recording SPO2 mid 90s on room air. Spoke with Dr.Ni Alvy Bimler (oncology) who has agreed to arrange patient's PET scan prior to seeing Dr. Lamonte Sakai on 3 march.   Consultants: Dr.William Thurman Coyer (cardiology) Dr.David Adah Salvage (neurosurgery) Dr.Peter Marisa Hua (neurology) Dr.Ni Alvy Bimler (oncology)     Procedure/Significant Events: 2/11 CXR;Borderline cardiomegaly and bilateral interstitial thickening, increased when compared to the prior chest radiograph.  -Mild congestive heart failure and interstitial edema is suspected 2/11 CT head without contrast;Patchy vasogenic edema, usually seen with multi focal metastatic disease or infection. -Multi focal cortical edema suspicious for superimposed infarcts. 2/11 CT angiogram chest PE protocol;No central pulmonary embolus. -Cardiac enlargement with hazy parenchymal opacities suggesting congestive failure with edema. -lymphadenopathy in the mediastinum involving the pretracheal, subcarinal, and left aortopulmonic window regions. Possible adenopathy in the left hilum.  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  2/12 CT abdomen pelvis with and without contrast pending;  2/12 2D Echocardiogram; - Left ventricle: mild LVH. -LVEF= 60% to 65%. -(grade 1 diastolic dysfunction). 2/13 CT abdomen pelvis with contrast;- No primary tumor or metastatic disease identified in the abdomen or pelvis.- Hepatic steatosis.- Diverticulosis of the colon.    Culture 2/12 blood right arm/left hand NGTD 2/13 urine negative 2/12 sputum negative 2/12 respiratory viral panel negative  Antibiotics: Levofloxacin 2/11>>  Discharge Exam: Filed Vitals:   10/30/14 0700 10/30/14 0721 10/30/14 0759 10/30/14 1144   BP:  121/74    Pulse:  58    Temp: 98.2 F (36.8 C)   98.5 F (36.9 C)  TempSrc: Oral   Oral  Resp:  12    Height:      Weight:      SpO2:  93% 97%     General: A/O 4, however patient is still somewhat confused continues to think he will come to work at [employer]. Wife reports some difficulty with remembering exactly when/where, negative fatigue, negative SOB Cardiovascular: Regular rhythm and rate, positive systolic murmur grade 3/6, negative gallop or rub. Respiratory: Clear to auscultation bilateral  Discharge Instructions     Medication List    STOP taking these medications        lisinopril-hydrochlorothiazide 20-25 MG per tablet  Commonly known as:  PRINZIDE,ZESTORETIC      TAKE these medications        albuterol 108 (90 BASE) MCG/ACT inhaler  Commonly known as:  PROVENTIL HFA;VENTOLIN HFA  Inhale 1-2 puffs into the lungs every 4 (four) hours as needed for wheezing or shortness of breath.     aspirin 81 MG tablet  Take 81 mg by mouth daily.     calcium-vitamin D 500-200 MG-UNIT per tablet  Commonly known as:  OSCAL WITH D  Take 1 tablet by mouth daily with breakfast.     dexamethasone 4 MG tablet  Commonly known as:  DECADRON  Take 1 tablet (4 mg total) by mouth 4 (four) times daily.     Fish Oil 1000 MG Caps  Take 1,000 mg by mouth daily.  Garlic 10 MG Caps  Take 10 mg elemental calcium/kg/hr by mouth daily.     levETIRAcetam 500 MG tablet  Commonly known as:  KEPPRA  Take 1 tablet (500 mg total) by mouth 2 (two) times daily.     Melatonin 10 MG Caps  Take 10 mg by mouth at bedtime.     nicotine 21 mg/24hr patch  Commonly known as:  NICODERM CQ - dosed in mg/24 hours  Place 1 patch (21 mg total) onto the skin daily.     pantoprazole 40 MG tablet  Commonly known as:  PROTONIX  Take 1 tablet (40 mg total) by mouth daily at 12 noon.     PARoxetine 20 MG tablet  Commonly known as:  PAXIL  Take 20 mg by mouth daily.     simvastatin 40  MG tablet  Commonly known as:  ZOCOR  Take 40 mg by mouth daily.     zolpidem 5 MG tablet  Commonly known as:  AMBIEN  Take 1 tablet (5 mg total) by mouth at bedtime as needed for sleep.       No Known Allergies Follow-up Information    Follow up with Loralie Champagne, MD.   Specialty:  Cardiology   Why:  office will contact you   Contact information:   [address] [place] [address] [place] [address] [phone]       Follow up with Collene Gobble., MD On 11/13/2014.   Specialty:  Pulmonary Disease   Why:  2 pm   Contact information:   [address] [place] [address] [phone]       Follow up with FULP, CAMMIE, MD. Schedule an appointment as soon as possible for a visit in 1 week.   Specialty:  Family Medicine   Why:  Hospital follow-up neoplasm unknown primary, chronic diastolic CHF, hypokalemia, obesity exacerbation   Contact information:   [address] [place] [address] [phone]        The results of significant diagnostics from this hospitalization (including imaging, microbiology, ancillary and laboratory) are listed below for reference.    Significant Diagnostic Studies: Dg Chest 2 View  10/23/2014   CLINICAL DATA:  Pt states that she is very SOB and has been congested for the past week now, wheezing.  EXAM: CHEST  2 VIEW  COMPARISON:  01/09/2007  FINDINGS: Cardiac silhouette borderline enlarged. No mediastinal or hilar masses or evidence of adenopathy.  There is bilateral interstitial thickening. No areas of lung consolidation is seen. No pleural effusion or pneumothorax.  Bony thorax is diffusely demineralized but grossly intact.  IMPRESSION: Borderline cardiomegaly and bilateral interstitial thickening. Interstitial thickening may all be chronic, but has increased when compared to the prior chest radiograph. Mild congestive heart failure and interstitial edema is suspected.   Electronically Signed   By: Lajean Manes M.D.   On:  10/23/2014 16:50   Ct Head Wo Contrast  10/23/2014   CLINICAL DATA:  Right upper extremity weakness.  EXAM: CT HEAD WITHOUT CONTRAST  TECHNIQUE: Contiguous axial images were obtained from the base of the skull through the vertex without intravenous contrast.  COMPARISON:  None.  FINDINGS: Skull and Sinuses:No evidence of bone metastasis. The mastoids and sinuses are clear.  Orbits: No acute abnormality.  Brain: There is diffuse abnormality of the cerebral hemispheres with asymmetric white matter low density that appears to have mass effect, effacing associated sulci. This is confluent throughout the right centrum semiovale and subcortical  white matter, more patchy on the left. The appearance favors vasogenic edema as seen with tumor or infection. There are also areas of small cortical low-density in the lateral right temporal lobe, parasagittal left frontal lobe, and left parietal lobe. There is no midline shift, hydrocephalus, or acute hemorrhage.  These results were called by telephone at the time of interpretation on 10/23/2014 at 4:59 pm to Dr. Ernestina Patches , who verbally acknowledged these results.  IMPRESSION: Patchy vasogenic edema, usually seen with multi focal metastatic disease or infection. There is also multi focal cortical edema suspicious for superimposed infarcts. Brain MRI with contrast is recommended.   Electronically Signed   By: Monte Fantasia M.D.   On: 10/23/2014 17:04   Ct Angio Chest Pe W/cm &/or Wo Cm  10/24/2014   ADDENDUM REPORT: 10/24/2014 01:23  ADDENDUM: There is lymphadenopathy in the mediastinum involving the pretracheal, subcarinal, and left aortopulmonic window regions. Possible adenopathy in the left hilum. Appearance worrisome for metastases.   Electronically Signed   By: Lucienne Capers M.D.   On: 10/24/2014 01:23   10/24/2014   CLINICAL DATA:  Shortness of breath for 2 days, worse this morning.  EXAM: CT ANGIOGRAPHY CHEST WITH CONTRAST  TECHNIQUE: Multidetector CT  imaging of the chest was performed using the standard protocol during bolus administration of intravenous contrast. Multiplanar CT image reconstructions and MIPs were obtained to evaluate the vascular anatomy.  CONTRAST:  189mL OMNIPAQUE IOHEXOL 350 MG/ML SOLN  COMPARISON:  None.  FINDINGS: Technically adequate study with moderately good opacification of the central and proximal segmental pulmonary arteries. Contrast bolus is somewhat limited and there is motion artifact, which limits evaluation of peripheral pulmonary arteries. No filling defects demonstrated suggesting no evidence of significant central pulmonary embolus.  Mild cardiac enlargement. Normal caliber thoracic aorta. Aortic calcification. Great vessel origins appear patent. Coronary artery calcifications. Esophagus is decompressed. No significant lymphadenopathy in the chest.  Evaluation of lungs is limited due to respiratory motion artifact. There appears to be patchy airspace disease in the lungs which may indicate edema or multifocal pneumonia. No pleural effusions. No pneumothorax. Airways appear patent although the lower trachea is somewhat narrowed.  Included portions of the upper abdominal organs are grossly unremarkable. Degenerative changes throughout the spine. No destructive bone lesions.  Review of the MIP images confirms the above findings.  IMPRESSION: No evidence of significant central pulmonary embolus. Peripheral vessels are not well demonstrated. Cardiac enlargement with hazy parenchymal opacities suggesting congestive failure with edema.  Electronically Signed: By: Lucienne Capers M.D. On: 10/23/2014 19:32   Mr Jeri Cos WU Contrast  10/24/2014   CLINICAL DATA:  Intermittent LEFT upper extremity thickening and seizure like symptoms that spontaneously resolve. Follow-up abnormal CT head.  EXAM: MRI HEAD WITHOUT AND WITH CONTRAST  TECHNIQUE: Multiplanar, multiecho pulse sequences of the brain and surrounding structures were obtained  without and with intravenous contrast.  CONTRAST:  28mL MULTIHANCE GADOBENATE DIMEGLUMINE 529 MG/ML IV SOLN  COMPARISON:  CT head October 23, 2014  FINDINGS: Moderately motion degraded examination.  At least 6 inferred tentorial and greater than 20 supratentorial rim enhancing lesions at the gray-white matter junction, largest measuring up to 14 mm. 7 mm lesion in RIGHT thalamus. 10 mm lesion in LEFT posterior pons at the level of the superior peduncle. Many of the lesions show slight reduced diffusion. Limited assessment for blood products due to severely motion degraded susceptibility weighted imaging. Lesion show no intrinsic T1 shortening. Lesions are T2 bright with extends is  associated vasogenic edema. 2 mm RIGHT to LEFT midline shift.  No hydrocephalus. No abnormal extra-axial fluid collections nor axial masses. No abnormal extra-axial enhancement the motion degrades sensitivity. Normal major intracranial vascular flow voids seen at the skull base.  Ocular globes and orbital contents are unremarkable. Paranasal sinuses and mastoid air cells are well aerated.  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 Consider correlation with CSF sampling, and possible PET-CT to assess for primary, as clinically indicated.   Electronically Signed   By: Elon Alas   On: 10/24/2014 01:02   Ct Abdomen Pelvis W Contrast  10/25/2014   CLINICAL DATA:  69 year old female with abnormal brain MRI suggesting metastatic disease. Subsequent encounter.  EXAM: CT ABDOMEN AND PELVIS WITH CONTRAST  TECHNIQUE: Multidetector CT imaging of the abdomen and pelvis was performed using the standard protocol following bolus administration of intravenous contrast.  CONTRAST:  13mL OMNIPAQUE IOHEXOL 300 MG/ML  SOLN  COMPARISON:  Chest CTA 10/23/14.  Brain MRI 10/23/14.  FINDINGS: Negative lung bases.   No acute osseous  abnormality identified.  No pelvic free fluid. Negative uterus, adenexa, bladder, rectum. Severe Sigmoid diverticulosis, continues less pronounced to the splenic flexure. No active inflammation. Oral contrast at the hepatic flexure. Negative more proximal colon. No dilated small bowel.  Negative stomach, duodenum, gallbladder, spleen, pancreas, adrenal glands. Negative kidneys; mild calcified atherosclerosis.  There is some hepatic steatosis.  Otherwise negative liver.  No free fluid. Aortoiliac calcified atherosclerosis noted. Major vascular structures are patent. No mass or lymphadenopathy.  IMPRESSION: 1. No primary tumor or metastatic disease identified in the abdomen or pelvis.  2.  Hepatic steatosis.  3.  Diverticulosis of the colon.   Electronically Signed   By: Genevie Ann M.D.   On: 10/25/2014 01:40   Dg Fluoro Guide Lumbar Puncture  10/28/2014   Vinnie Langton, MD     10/28/2014  1:35 PM Lumbar puncture performed at L3-L4.  Tap slightly bloody, and  failed to completely clear.  Opening pressure 20 cm H2O.  A total  of 6 cc of CSF collected and sent to lab for analysis.  No  complications.  See full report in PACS.   Microbiology: Recent Results (from the past 240 hour(s))  MRSA PCR Screening     Status: None   Collection Time: 10/24/14  6:41 AM  Result Value Ref Range Status   MRSA by PCR NEGATIVE NEGATIVE Final    Comment:        The GeneXpert MRSA Assay (FDA approved for NASAL specimens only), is one component of a comprehensive MRSA colonization surveillance program. It is not intended to diagnose MRSA infection nor to guide or monitor treatment for MRSA infections.   Respiratory virus panel (routine influenza)     Status: None   Collection Time: 10/24/14 10:15 PM  Result Value Ref Range Status   Respiratory Syncytial Virus A Negative Negative Final   Respiratory Syncytial Virus B Negative Negative Final   Influenza A Negative Negative Final   Influenza B Negative Negative Final    Parainfluenza 1 Negative Negative Final   Parainfluenza 2 Negative Negative Final   Parainfluenza 3 Negative Negative Final   Metapneumovirus Negative Negative Final   Rhinovirus Negative Negative Final   Adenovirus Negative Negative Final    Comment: (NOTE) Performed At: [facility] [address] [place], [place] [id number] Lindon Romp  MD YI:[id number]   Culture, blood (routine x 2)     Status: None (Preliminary result)   Collection Time: 10/24/14 10:40 PM  Result Value Ref Range Status   Specimen Description BLOOD RIGHT ARM  Final   Special Requests BOTTLES DRAWN AEROBIC ONLY 4CC  Final   Culture   Final           BLOOD CULTURE RECEIVED NO GROWTH TO DATE CULTURE WILL BE HELD FOR 5 DAYS BEFORE ISSUING A FINAL NEGATIVE REPORT Performed at [facility]    Report Status PENDING  Incomplete  Culture, blood (routine x 2)     Status: None (Preliminary result)   Collection Time: 10/24/14 10:45 PM  Result Value Ref Range Status   Specimen Description BLOOD LEFT HAND  Final   Special Requests BOTTLES DRAWN AEROBIC ONLY 5CC  Final   Culture   Final           BLOOD CULTURE RECEIVED NO GROWTH TO DATE CULTURE WILL BE HELD FOR 5 DAYS BEFORE ISSUING A FINAL NEGATIVE REPORT Performed at [facility]    Report Status PENDING  Incomplete  Culture, expectorated sputum-assessment     Status: None   Collection Time: 10/25/14 12:37 AM  Result Value Ref Range Status   Specimen Description INDUCED SPUTUM  Final   Special Requests NONE  Final   Sputum evaluation   Final    MICROSCOPIC FINDINGS SUGGEST THAT THIS SPECIMEN IS NOT REPRESENTATIVE OF LOWER RESPIRATORY SECRETIONS. PLEASE RECOLLECT. CALLED TO H.DILLON RN [phone] 10/25/14 E.GADDY    Report Status 10/25/2014 FINAL  Final  Culture, respiratory (NON-Expectorated)     Status: None   Collection Time: 10/25/14 12:37 AM  Result Value Ref Range Status   Specimen Description INDUCED SPUTUM  Final   Special Requests  Normal  Final   Gram Stain   Final    RARE WBC PRESENT, PREDOMINANTLY PMN FEW SQUAMOUS EPITHELIAL CELLS PRESENT MODERATE GRAM POSITIVE COCCI IN PAIRS MODERATE GRAM POSITIVE RODS MODERATE GRAM NEGATIVE RODS    Culture   Final    NORMAL OROPHARYNGEAL FLORA Performed at [facility]    Report Status 10/27/2014 FINAL  Final  Culture, Urine     Status: None   Collection Time: 10/25/14  6:45 AM  Result Value Ref Range Status   Specimen Description URINE, CLEAN CATCH  Final   Special Requests NONE  Final   Colony Count   Final    8,000 COLONIES/ML Performed at [facility]    Culture   Final    INSIGNIFICANT GROWTH Performed at [facility]    Report Status 10/26/2014 FINAL  Final     Labs: Basic Metabolic Panel:  Recent Labs Lab 10/24/14 0940 10/25/14 0400 10/26/14 0258 10/27/14 0240 10/28/14 0307 10/28/14 2248 10/30/14 0330  NA 139 140 141 141 139 138 138  K 3.0* 3.7 4.1 4.8 4.3 4.8 4.9  CL 103 99 102 103 101 102 106  CO2 26 33* 32 31 24 27 23   GLUCOSE 153* 116* 116* 108* 109* 137* 102*  BUN 15 17 25* 27* 38* 41* 30*  CREATININE 0.84 0.83 1.02 1.00 0.97 1.34* 0.84  CALCIUM 8.9 9.3 9.4 9.3 9.8 9.2 9.1  MG 1.9 2.0 2.0 2.3  --  2.4  --    Liver Function Tests:  Recent Labs Lab 10/24/14 0940 10/25/14 0400 10/26/14 0258 10/27/14 0240 10/28/14 2248  AST 77* 114* 71* 36 27  ALT 76* 71* 62* 55* 47*  ALKPHOS 66 64 59 60 66  BILITOT 0.5 0.4 0.8 0.7 0.5  PROT 6.6 7.1 7.1 7.0 6.9  ALBUMIN 3.3* 3.5 3.6 3.4* 3.7   No results for input(s): LIPASE, AMYLASE in the last 168 hours. No results for input(s): AMMONIA in the last 168 hours. CBC:  Recent Labs Lab 10/24/14 2245 10/26/14 0258 10/27/14 0240 10/28/14 2248 10/30/14 0330  WBC 15.8* 13.5* 11.1* 13.0* 12.5*  NEUTROABS 13.9*  --   --  10.7*  --   HGB 15.1* 15.4* 16.0* 17.5* 17.7*  HCT 46.3* 48.1* 51.3* 53.6* 52.5*  MCV 91.7 91.1 91.3 92.3 89.6  PLT 199 205 210 229 208   Cardiac  Enzymes:  Recent Labs Lab 10/24/14 0323 10/24/14 0810 10/24/14 2245 10/25/14 0400 10/25/14 1200  TROPONINI 1.32* 1.11* 0.66* 0.55* 0.41*   BNP: BNP (last 3 results)  Recent Labs  10/23/14 1542  BNP 95.2    ProBNP (last 3 results) No results for input(s): PROBNP in the last 8760 hours.  CBG: No results for input(s): GLUCAP in the last 168 hours.     Signed:  Dia Crawford, MD [facility] [phone] pager

## 2014-10-30 NOTE — Progress Notes (Signed)
Caitlin Carter   DOB:1945/10/29   CV#:[id number]   ZWC#:[id number]  Patient Care Team: Antony Blackbird, MD as PCP - General (Family Medicine)  I have seen the patient, examined her and edited the notes as follows  Subjective: Patient seen and examined. Reports feeling better since prior day. Denies fevers, chills, night sweats, vision changes, or mucositis. She is less short of breath. Denies any chest pain or palpitations. Denies lower extremity swelling. Denies nausea, heartburn or change in bowel habits. Appetite is normal. Denies any dysuria. Denies abnormal skin rashes, or neuropathy. Denies any bleeding issues such as epistaxis, hematemesis, hematuria or hematochezia. Ambulating without difficulty.   Scheduled Meds: . aspirin EC  81 mg Oral Daily  . calcium-vitamin D  1 tablet Oral Q breakfast  . dexamethasone  4 mg Intravenous 4 times per day  . levETIRAcetam  500 mg Oral BID  . nicotine  21 mg Transdermal Daily  . pantoprazole  40 mg Oral Q1200  . PARoxetine  20 mg Oral Daily  . simvastatin  40 mg Oral q1800   Continuous Infusions:  PRN Meds:acetaminophen, albuterol, ipratropium-albuterol, ondansetron, senna-docusate, zolpidem   Objective:  Filed Vitals:   10/30/14 1144  BP:   Pulse:   Temp: 98.5 F (36.9 C)  Resp:       Intake/Output Summary (Last 24 hours) at 10/30/14 1145 Last data filed at 10/30/14 2353  Gross per 24 hour  Intake    120 ml  Output    500 ml  Net   -380 ml    ECOG PERFORMANCE STATUS: 2  GENERAL:alert, no distress and comfortable. She is morbidly obese SKIN: skin color, texture, turgor are normal, no rashes or significant lesions. She has some seborrheic keratosis.  EYES:left ptosis noted. Conjunctiva are pink and non-injected, sclera clear OROPHARYNX: no exudate, no erythema and lips, buccal mucosa, and tongue normal  NECK: supple, thyroid normal size, non-tender, without nodularity LYMPH: no palpable lymphadenopathy in the cervical, axillary  or inguinal LUNGS: clear to auscultation and percussion with normal breathing effort HEART: regular rate & rhythm and no murmurs and no lower extremity edema ABDOMEN: morbidly obese, soft, non-tender and normal bowel sounds Musculoskeletal:no cyanosis of digits and no clubbing  PSYCH: alert & oriented x 3 with fluent speech NEURO: decreased sensation in upper digits. Left ptosis.     CBG (last 3)  No results for input(s): GLUCAP in the last 72 hours.   Labs:   Recent Labs Lab 10/23/14 1542  10/24/14 2245 10/26/14 0258 10/27/14 0240 10/28/14 2248 10/30/14 0330  WBC 13.5*  < > 15.8* 13.5* 11.1* 13.0* 12.5*  HGB 14.9  < > 15.1* 15.4* 16.0* 17.5* 17.7*  HCT 45.4  < > 46.3* 48.1* 51.3* 53.6* 52.5*  PLT 197  < > 199 205 210 229 208  MCV 88.8  < > 91.7 91.1 91.3 92.3 89.6  MCH 29.2  < > 29.9 29.2 28.5 30.1 30.2  MCHC 32.8  < > 32.6 32.0 31.2 32.6 33.7  RDW 14.3  < > 14.4 14.7 14.7 14.7 14.5  LYMPHSABS 0.9  --  1.0  --   --  1.3  --   MONOABS 0.5  --  0.9  --   --  1.0  --   EOSABS 0.0  --  0.0  --   --  0.0  --   BASOSABS 0.0  --  0.0  --   --  0.0  --   < > = values in this  interval not displayed.   Chemistries:    Recent Labs Lab 10/24/14 0940 10/25/14 0400 10/26/14 0258 10/27/14 0240 10/28/14 0307 10/28/14 2248 10/30/14 0330  NA 139 140 141 141 139 138 138  K 3.0* 3.7 4.1 4.8 4.3 4.8 4.9  CL 103 99 102 103 101 102 106  CO2 26 33* 32 31 24 27 23   GLUCOSE 153* 116* 116* 108* 109* 137* 102*  BUN 15 17 25* 27* 38* 41* 30*  CREATININE 0.84 0.83 1.02 1.00 0.97 1.34* 0.84  CALCIUM 8.9 9.3 9.4 9.3 9.8 9.2 9.1  MG 1.9 2.0 2.0 2.3  --  2.4  --   AST 77* 114* 71* 36  --  27  --   ALT 76* 71* 62* 55*  --  47*  --   ALKPHOS 66 64 59 60  --  66  --   BILITOT 0.5 0.4 0.8 0.7  --  0.5  --     GFR Estimated Creatinine Clearance: 86.3 mL/min (by C-G formula based on Cr of 0.84).  Liver Function Tests:  Recent Labs Lab 10/24/14 0940 10/25/14 0400 10/26/14 0258  10/27/14 0240 10/28/14 2248  AST 77* 114* 71* 36 27  ALT 76* 71* 62* 55* 47*  ALKPHOS 66 64 59 60 66  BILITOT 0.5 0.4 0.8 0.7 0.5  PROT 6.6 7.1 7.1 7.0 6.9  ALBUMIN 3.3* 3.5 3.6 3.4* 3.7   No results for input(s): LIPASE, AMYLASE in the last 168 hours. No results for input(s): AMMONIA in the last 168 hours.  Urine Studies     Component Value Date/Time   COLORURINE YELLOW 10/23/2014 1628   APPEARANCEUR CLOUDY* 10/23/2014 1628   LABSPEC 1.015 10/23/2014 1628   PHURINE 6.0 10/23/2014 1628   GLUCOSEU NEGATIVE 10/23/2014 1628   HGBUR NEGATIVE 10/23/2014 1628   BILIRUBINUR NEGATIVE 10/23/2014 1628   KETONESUR NEGATIVE 10/23/2014 1628   PROTEINUR 30* 10/23/2014 1628   UROBILINOGEN 0.2 10/23/2014 1628   NITRITE NEGATIVE 10/23/2014 1628   LEUKOCYTESUR NEGATIVE 10/23/2014 1628    Coagulation profile  Recent Labs Lab 10/23/14 1542  INR 0.99    Cardiac Enzymes:  Recent Labs Lab 10/24/14 0323 10/24/14 0810 10/24/14 2245 10/25/14 0400 10/25/14 1200  TROPONINI 1.32* 1.11* 0.66* 0.55* 0.41*    Microbiology: Cultures pending   Imaging Studies:  Dg Fluoro Guide Lumbar Puncture  10/28/2014   Vinnie Langton, MD     10/28/2014  1:35 PM Lumbar puncture performed at L3-L4.  Tap slightly bloody, and  failed to completely clear.  Opening pressure 20 cm H2O.  A total  of 6 cc of CSF collected and sent to lab for analysis.  No  complications.  See full report in PACS.   Assessment/Plan: 69 y.o.  Brain lesions Patient was admitted with bilateral arm numbness and stiffness (?seizure) CT head was remarkable for patchy vasogenic edema suspicious for multi focal metastatic disease or infection. There were also multi focal cortical edema suspicious for superimposed infarcts.  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  She was placed on IV decadron, keppra with improvement of symptoms. Recommend continue the same CT of the chest showed some lymphadenopathy; abdomen and pelvis did not reveal any other abnormal deposit of tumor Lumbar puncture was negative for infection.  She is scheduled for bronchoscopy with endobronchial ultrasound for 11/13/14  after PET scan is obtained as outpatient I will arrange PET scan next week and hospital follow-up. She will continue dexamethasone.  Erythrocytosis This islikely related to her COPD and tobacco abuse,chronic hypoxia Monitor counts closely  Leukocytosis This is due to Decadron, history of tobacco, COPD No intervention is indicated at this time Will continue to monitor  DVT prophylaxis  Currently on Aspirin Lovenox on hold in anticipation of possible biopsy.  History of OSA On CPAP at bedtime  History of tobacco abuse She will be discharged on nicotine patch.  Full Code  Discharge planning She is expected to be discharged today. Follow-up appt is arranged. Will sign off  **Disclaimer: This note was dictated with voice recognition software. Similar sounding words can inadvertently be transcribed and this note may contain transcription errors which may not have been corrected upon publication of note.Sharene Butters E, PA-C 10/30/2014  11:45 AM Alfrieda Tarry, MD 10/30/2014

## 2014-10-30 NOTE — Progress Notes (Signed)
CARE MANAGEMENT NOTE 10/30/2014  Patient:  Caitlin Carter, Caitlin Carter   Account Number:  [id number]  Date Initiated:  10/27/2014  Documentation initiated by:  Marvetta Gibbons  Subjective/Objective Assessment:   Pt admitted with COPD and bil arm weakness     Action/Plan:   PTA pt lived at home alone- PT/OT evals-CSW consult for possible SNF   Anticipated DC Date:  10/31/2014   Anticipated DC Plan:  SKILLED NURSING FACILITY  In-house referral  Clinical Social Worker      DC Planning Services  CM consult      Choice offered to / List presented to:             Status of service:  Completed, signed off Medicare Important Message given?  YES (If response is "NO", the following Medicare IM given date fields will be blank) Date Medicare IM given:  10/27/2014 Medicare IM given by:  Marvetta Gibbons Date Additional Medicare IM given:  10/30/2014 Additional Medicare IM given by:  Marvetta Gibbons  Discharge Disposition:  HOME/SELF CARE  Per UR Regulation:  Reviewed for med. necessity/level of care/duration of stay  If discussed at [place] of Stay Meetings, dates discussed:   10/28/2014  10/30/2014    Comments:  10/30/14- 1700- Marvetta Gibbons RN, BSN [phone] Pt for d/c home tonight- PT now recommending [facility]- called pt in room to see if see wanted to have [place] at home- per conversation with pt she states that she feels like she is close to her baseline and stronger - she does not feel like she needs HH at this time- has RW at home and cpap- explained to pt that if she changed her mind to f/u with her PCP for any HH needs- no HH orders placed by MD.

## 2014-10-30 NOTE — Telephone Encounter (Signed)
both #'s are incorrect....mailed pt appt sched and letter

## 2014-10-30 NOTE — Progress Notes (Signed)
Occupational Therapy Treatment Patient Details Name: SARA KEYS MRN: [id number] DOB: Sep 27, 1945 Today's Date: 10/30/2014    History of present illness pt presents with L sided weakness and MRI found to show Metestatic Disease or infectious process.  pt found to have Mediastinal Lymphadenopathy and L Hilar Adenopathy.  PMHx:HTN, depression, obseity, knee replacement   OT comments  Pt seen today to address functional mobility, independence with ADLs, and activity tolerance. Pt was eager to get OOB as she now wishes to d/c home. Educated pt on safety with ADLs at home including fall prevention and energy conservation. Pt at Supervision level for functional mobility. Feel that she could go home with 24/7 supervision and [place].    Follow Up Recommendations  Home health OT;Supervision/Assistance - 24 hour    Equipment Recommendations  None recommended by OT    Recommendations for Other Services      Precautions / Restrictions Precautions Precautions: Fall Restrictions Weight Bearing Restrictions: No       Mobility Bed Mobility Overal bed mobility: Modified Independent                Transfers Overall transfer level: Needs assistance Equipment used: Rolling walker (2 wheeled) Transfers: Sit to/from Stand Sit to Stand: Supervision         General transfer comment: Pt with good transfer and no VC's for physical assist needed.         ADL Overall ADL's : Needs assistance/impaired                                       General ADL Comments: Pt was eager to get OOB and ambulate to increase activity endurance for ADLs. During functional mobility, pt maintained O2 sats. Educated pt on fall prevention and energy conservation. Pt reports that she has a shower seat.                 Cognition  Arousal/Alertness: Awake/Alert Behavior During Therapy: WFL for tasks assessed/performed Overall Cognitive Status: Within Functional Limits for tasks  assessed                                    Pertinent Vitals/ Pain       Pain Assessment: No/denies pain         Frequency Min 2X/week     Progress Toward Goals  OT Goals(current goals can now be found in the care plan section)  Progress towards OT goals: Progressing toward goals  Acute Rehab OT Goals Patient Stated Goal: to go home today OT Goal Formulation: With patient Time For Goal Achievement: 11/03/14 Potential to Achieve Goals: Good  Plan Discharge plan needs to be updated       End of Session Equipment Utilized During Treatment: Gait belt;Rolling walker   Activity Tolerance Patient tolerated treatment well   Patient Left in chair;with call bell/phone within reach;with family/visitor present   Nurse Communication          Time: 1062-6948 OT Time Calculation (min): 19 min  Charges: OT General Charges $OT Visit: 1 Procedure OT Treatments $Self Care/Home Management : 8-22 mins  Villa Herb M 10/30/2014, 4:28 PM  Cyndie Chime, OTR/L Occupational Therapist [phone] (pager)

## 2014-10-30 NOTE — Progress Notes (Signed)
Pt d/c home per MD, d/c instruction given, prescriptions given, pt VSS, pt verbalized understanding of d/c, family at [facility], all questions answered

## 2014-10-30 NOTE — Clinical Social Work Note (Signed)
CSW reviewed chart. Per PT, the pt can transition home with home PT. CSW informed case manager. CSW will sign off.   Amelia, MSW, [place]

## 2014-10-30 NOTE — Progress Notes (Signed)
Physical Therapy Treatment Patient Details Name: ALEJANDRO ADCOX MRN: [id number] DOB: 1946-04-08 Today's Date: 10/30/2014    History of Present Illness pt presents with L sided weakness and MRI found to show Metestatic Disease or infectious process.  pt found to have Mediastinal Lymphadenopathy and L Hilar Adenopathy.  PMHx:HTN, depression, obseity, knee replacement    PT Comments    Pt continues to improve overall mobility and is wanting D/C to home at this point.  Feel pending pt continued work-up she could D/C to home with only A for errands and cooking/cleaning tasks.  Pt would benefit from HHPT and Aide at D/C.  Will continue to follow.    Follow Up Recommendations  Home health PT;Supervision - Intermittent     Equipment Recommendations  Rolling walker with 5" wheels    Recommendations for Other Services       Precautions / Restrictions Precautions Precautions: Fall Restrictions Weight Bearing Restrictions: No    Mobility  Bed Mobility Overal bed mobility: Modified Independent                Transfers Overall transfer level: Needs assistance Equipment used: Rolling walker (2 wheeled) Transfers: Sit to/from [employer] Transfers Sit to Stand: Supervision Stand pivot transfers: Supervision       General transfer comment: pt moving well today.  Needed A for set-up of RW and cues to scoot to EOB prior to coming to stand.  pt pivoted to 3-in-1 prior to ambulating.    Ambulation/Gait Ambulation/Gait assistance: Supervision Ambulation Distance (Feet): 350 Feet Assistive device: Rolling walker (2 wheeled) Gait Pattern/deviations: Step-through pattern;Decreased stride length     General Gait Details: pt moving well and able to increase ambulation distance today.  No c/o feeling SOB throughout ambulation.     Stairs            Wheelchair Mobility    Modified Rankin (Stroke Patients Only)       Balance Overall balance assessment: Needs  assistance Sitting-balance support: No upper extremity supported;Feet supported Sitting balance-Leahy Scale: Good     Standing balance support: No upper extremity supported Standing balance-Leahy Scale: Fair Standing balance comment: pt able to stand for short periods of time without UE support.                      Cognition Arousal/Alertness: Awake/alert Behavior During Therapy: WFL for tasks assessed/performed Overall Cognitive Status: Within Functional Limits for tasks assessed                      Exercises      General Comments        Pertinent Vitals/Pain Pain Assessment: No/denies pain    Home Living                      Prior Function            PT Goals (current goals can now be found in the care plan section) Acute Rehab PT Goals Patient Stated Goal: Get back to normal.   PT Goal Formulation: With patient Time For Goal Achievement: 11/07/14 Potential to Achieve Goals: Good Progress towards PT goals: Progressing toward goals    Frequency  Min 3X/week    PT Plan Discharge plan needs to be updated    Co-evaluation             End of Session Equipment Utilized During Treatment: Gait belt Activity Tolerance: Patient tolerated treatment well Patient  left: in chair;with call bell/phone within reach     Time: 0900-0918 PT Time Calculation (min) (ACUTE ONLY): 18 min  Charges:  $Gait Training: 8-22 mins                    G CodesCatarina Hartshorn, [place] 10/30/2014, 11:25 AM

## 2014-10-30 NOTE — Progress Notes (Signed)
Subjective: No events over night.  Very up beat spirit.  Waling in hallway with walker and PT.  No complaints of blurred vision today.   Objective: Current vital signs: BP 120/46 mmHg  Pulse 59  Temp(Src) 98.2 F (36.8 C) (Oral)  Resp 18  Ht 5\' 6"  (1.676 m)  Wt 124.3 kg (274 lb 0.5 oz)  BMI 44.25 kg/m2  SpO2 97% Vital signs in last 24 hours: Temp:  [97.8 F (36.6 C)-98.3 F (36.8 C)] 98.2 F (36.8 C) (02/18 0700) Pulse Rate:  [59-97] 59 (02/18 0459) Resp:  [11-23] 18 (02/17 2349) BP: (116-142)/(46-72) 120/46 mmHg (02/18 0459) SpO2:  [90 %-99 %] 97 % (02/18 0759) Weight:  [124.3 kg (274 lb 0.5 oz)] 124.3 kg (274 lb 0.5 oz) (02/18 0500)  Intake/Output from previous day: 02/17 0701 - 02/18 0700 In: 120 [P.O.:120] Out: 500 [Urine:500] Intake/Output this shift:   Nutritional status: Diet regular  Neurologic Exam: General: NAD Mental Status: Alert, oriented, thought content appropriate.  Speech fluent without evidence of aphasia.  Able to follow 3 step commands without difficulty. Cranial Nerves: II:  Visual fields grossly normal, pupils equal, round, reactive to light and accommodation III,IV, VI: ptosis not present, extra-ocular motions intact bilaterally V,VII: smile symmetric, facial light touch sensation normal bilaterally VIII: hearing normal bilaterally IX,X: gag reflex present XI: bilateral shoulder shrug XII: midline tongue extension without atrophy or fasciculations  Motor: Right : Upper extremity   5/5    Left:     Upper extremity   5/5  Lower extremity   5/5     Lower extremity   5/5 Tone and bulk:normal tone throughout; no atrophy noted Sensory: Pinprick and light touch intact throughout, bilaterally Deep Tendon Reflexes:  1+ throughout with no AJ  Plantars: Right: downgoing   Left: downgoing Cerebellar: normal finger-to-nose,  normal heel-to-shin test Gait: uses walker  CV: pulses palpable throughout    Lab Results: Basic Metabolic  Panel:  Recent Labs Lab 10/24/14 0940 10/25/14 0400 10/26/14 0258 10/27/14 0240 10/28/14 0307 10/28/14 2248 10/30/14 0330  NA 139 140 141 141 139 138 138  K 3.0* 3.7 4.1 4.8 4.3 4.8 4.9  CL 103 99 102 103 101 102 106  CO2 26 33* 32 31 24 27 23   GLUCOSE 153* 116* 116* 108* 109* 137* 102*  BUN 15 17 25* 27* 38* 41* 30*  CREATININE 0.84 0.83 1.02 1.00 0.97 1.34* 0.84  CALCIUM 8.9 9.3 9.4 9.3 9.8 9.2 9.1  MG 1.9 2.0 2.0 2.3  --  2.4  --     Liver Function Tests:  Recent Labs Lab 10/24/14 0940 10/25/14 0400 10/26/14 0258 10/27/14 0240 10/28/14 2248  AST 77* 114* 71* 36 27  ALT 76* 71* 62* 55* 47*  ALKPHOS 66 64 59 60 66  BILITOT 0.5 0.4 0.8 0.7 0.5  PROT 6.6 7.1 7.1 7.0 6.9  ALBUMIN 3.3* 3.5 3.6 3.4* 3.7   No results for input(s): LIPASE, AMYLASE in the last 168 hours. No results for input(s): AMMONIA in the last 168 hours.  CBC:  Recent Labs Lab 10/23/14 1542  10/24/14 2245 10/26/14 0258 10/27/14 0240 10/28/14 2248 10/30/14 0330  WBC 13.5*  < > 15.8* 13.5* 11.1* 13.0* 12.5*  NEUTROABS 12.1*  --  13.9*  --   --  10.7*  --   HGB 14.9  < > 15.1* 15.4* 16.0* 17.5* 17.7*  HCT 45.4  < > 46.3* 48.1* 51.3* 53.6* 52.5*  MCV 88.8  < > 91.7 91.1 91.3  92.3 89.6  PLT 197  < > 199 205 210 229 208  < > = values in this interval not displayed.  Cardiac Enzymes:  Recent Labs Lab 10/24/14 0323 10/24/14 0810 10/24/14 2245 10/25/14 0400 10/25/14 1200  TROPONINI 1.32* 1.11* 0.66* 0.55* 0.41*    Lipid Panel:  Recent Labs Lab 10/24/14 0323  CHOL 160  TRIG 170*  HDL 38*  CHOLHDL 4.2  VLDL 34  LDLCALC 88    CBG: No results for input(s): GLUCAP in the last 168 hours.  Microbiology: Results for orders placed or performed during the hospital encounter of 10/23/14  MRSA PCR Screening     Status: None   Collection Time: 10/24/14  6:41 AM  Result Value Ref Range Status   MRSA by PCR NEGATIVE NEGATIVE Final    Comment:        The GeneXpert MRSA Assay  (FDA approved for NASAL specimens only), is one component of a comprehensive MRSA colonization surveillance program. It is not intended to diagnose MRSA infection nor to guide or monitor treatment for MRSA infections.   Respiratory virus panel (routine influenza)     Status: None   Collection Time: 10/24/14 10:15 PM  Result Value Ref Range Status   Respiratory Syncytial Virus A Negative Negative Final   Respiratory Syncytial Virus B Negative Negative Final   Influenza A Negative Negative Final   Influenza B Negative Negative Final   Parainfluenza 1 Negative Negative Final   Parainfluenza 2 Negative Negative Final   Parainfluenza 3 Negative Negative Final   Metapneumovirus Negative Negative Final   Rhinovirus Negative Negative Final   Adenovirus Negative Negative Final    Comment: (NOTE) Performed At: [facility] [place], [place] [id number] Lindon Romp MD [place]:[id number]   Culture, blood (routine x 2)     Status: None (Preliminary result)   Collection Time: 10/24/14 10:40 PM  Result Value Ref Range Status   Specimen Description BLOOD RIGHT ARM  Final   Special Requests BOTTLES DRAWN AEROBIC ONLY 4CC  Final   Culture   Final           BLOOD CULTURE RECEIVED NO GROWTH TO DATE CULTURE WILL BE HELD FOR 5 DAYS BEFORE ISSUING A FINAL NEGATIVE REPORT Performed at [facility]    Report Status PENDING  Incomplete  Culture, blood (routine x 2)     Status: None (Preliminary result)   Collection Time: 10/24/14 10:45 PM  Result Value Ref Range Status   Specimen Description BLOOD LEFT HAND  Final   Special Requests BOTTLES DRAWN AEROBIC ONLY 5CC  Final   Culture   Final           BLOOD CULTURE RECEIVED NO GROWTH TO DATE CULTURE WILL BE HELD FOR 5 DAYS BEFORE ISSUING A FINAL NEGATIVE REPORT Performed at [facility]    Report Status PENDING  Incomplete  Culture, expectorated sputum-assessment     Status: None   Collection Time: 10/25/14  12:37 AM  Result Value Ref Range Status   Specimen Description INDUCED SPUTUM  Final   Special Requests NONE  Final   Sputum evaluation   Final    MICROSCOPIC FINDINGS SUGGEST THAT THIS SPECIMEN IS NOT REPRESENTATIVE OF LOWER RESPIRATORY SECRETIONS. PLEASE RECOLLECT. CALLED TO H.DILLON RN [phone] 10/25/14 E.[email or web address]    Report Status 10/25/2014 FINAL  Final  Culture, respiratory (NON-Expectorated)     Status: None   Collection Time: 10/25/14 12:37 AM  Result Value Ref Range Status  Specimen Description INDUCED SPUTUM  Final   Special Requests Normal  Final   Gram Stain   Final    RARE WBC PRESENT, PREDOMINANTLY PMN FEW SQUAMOUS EPITHELIAL CELLS PRESENT MODERATE GRAM POSITIVE COCCI IN PAIRS MODERATE GRAM POSITIVE RODS MODERATE GRAM NEGATIVE RODS    Culture   Final    NORMAL OROPHARYNGEAL FLORA Performed at [facility]    Report Status 10/27/2014 FINAL  Final  Culture, Urine     Status: None   Collection Time: 10/25/14  6:45 AM  Result Value Ref Range Status   Specimen Description URINE, CLEAN CATCH  Final   Special Requests NONE  Final   Colony Count   Final    8,000 COLONIES/ML Performed at [facility]    Culture   Final    INSIGNIFICANT GROWTH Performed at [facility]    Report Status 10/26/2014 FINAL  Final    Coagulation Studies: No results for input(s): LABPROT, INR in the last 72 hours.  Imaging: Dg Fluoro Guide Lumbar Puncture  10/28/2014   Vinnie Langton, MD     10/28/2014  1:35 PM Lumbar puncture performed at L3-L4.  Tap slightly bloody, and  failed to completely clear.  Opening pressure 20 cm H2O.  A total  of 6 cc of CSF collected and sent to lab for analysis.  No  complications.  See full report in PACS.   Medications:  Scheduled: . aspirin EC  81 mg Oral Daily  . calcium-vitamin D  1 tablet Oral Q breakfast  . dexamethasone  4 mg Intravenous 4 times per day  . levETIRAcetam  500 mg Oral BID  . nicotine  21 mg Transdermal Daily   . pantoprazole  40 mg Oral Q1200  . PARoxetine  20 mg Oral Daily  . simvastatin  40 mg Oral q1800    Assessment/Plan:  69 year old lady with a history of hypertension and hyperlipidemia who presented with probable focal left upper extremity motor seizure.  No further seizures while on Keppra.  MRI showed widespread mass lesions with vasogenic edema, probable metastasis. Primary tumor remains unidentified.   CT of the chest showed some lymphadenopathy; abdomen and pelvis did not reveal any other abnormal deposit of tumor.   LP results (glucose 63, protein 53, traumatic tap with 6925 RBC, WBC 7, color pink, no malignant cells identified)   PCCM has been consulted and decision to do lung biopsy still pending. Radiation Oncology evaluation is pending.   Recommend no changes in current doses of Decadron for management of vasogenic brain edema, and continue Keppra for seizure control.   We will continue to follow this patient with you.  Etta Quill PA-C [facility] [phone]  10/30/2014, 9:13 AM

## 2014-10-30 NOTE — Progress Notes (Signed)
Medicare Important Message given? YES  (If response is "NO", the following Medicare IM given date fields will be blank)  Date Medicare IM given: 10/30/14 Medicare IM given by:  Dahlia Client [employer]

## 2014-10-31 ENCOUNTER — Other Ambulatory Visit: Payer: Self-pay | Admitting: *Deleted

## 2014-10-31 DIAGNOSIS — C7931 Secondary malignant neoplasm of brain: Secondary | ICD-10-CM

## 2014-10-31 LAB — CULTURE, BLOOD (ROUTINE X 2)
CULTURE: NO GROWTH
Culture: NO GROWTH

## 2014-11-03 ENCOUNTER — Encounter: Payer: Self-pay | Admitting: Radiation Oncology

## 2014-11-03 NOTE — Progress Notes (Signed)
Location/Histology of Brain Tumor: MRI brain from 10/24/14 shows "6 infratentorial and greater than 20 is supratentorial metastasis with extensive vasogenic edema resulting in 2 mm RIGHT to LEFT midline shift."  There is an unknown primary.  Patient presented with symptoms of: sudden onset left upper extremity weakness beginning on 10/23/14.:    Past or anticipated interventions, if any, per neurosurgery: Per Dr. Ronnald Ramp "If a diagnosis can be found no other way then she could potentially be a candidate for a craniotomy for resection one of the lesions. The lesions appear to be too small to get an accurate biopsy with a simple needle approach."  Past or anticipated interventions, if any, per medical oncology: seeing Dr. Alvy Bimler  Dose of Decadron, if applicable: 4 mg four times a day  Recent neurologic symptoms, if any:   Seizures: NO  Headaches: NO  Nausea: NO  Dizziness/ataxia: Yes, dizzynes when 1st standing up from sitting position  Difficulty with hand coordination:NO  Focal numbness/weaknespatients: sometimes hand numbness,   Visual deficits/changes: feels vision changes due to loss of right contact lenses a few years ago  Confusion/Memory deficits: confusion  Last 2-3 weeks  Painful bone metastases at present, if any: none at present  SAFETY ISSUES:  Prior radiation? No  Pacemaker/ICD? NO  Possible current pregnancy? no  Is the patient on methotrexate? no  Additional Complaints / other details:Widowed, no children  PET scan scheduled for 11/05/14. She is scheduled for bronchoscopy with endobronchial ultrasound for 11/13/14

## 2014-11-05 ENCOUNTER — Ambulatory Visit (HOSPITAL_COMMUNITY)
Admission: RE | Admit: 2014-11-05 | Discharge: 2014-11-05 | Disposition: A | Discharge status: Home / Self Care | Payer: Medicare Other | Source: Ambulatory Visit | Attending: Hematology and Oncology | Admitting: Hematology and Oncology

## 2014-11-05 DIAGNOSIS — C3490 Malignant neoplasm of unspecified part of unspecified bronchus or lung: Secondary | ICD-10-CM | POA: Diagnosis not present

## 2014-11-05 DIAGNOSIS — C7931 Secondary malignant neoplasm of brain: Secondary | ICD-10-CM

## 2014-11-05 DIAGNOSIS — C801 Malignant (primary) neoplasm, unspecified: Secondary | ICD-10-CM

## 2014-11-05 LAB — GLUCOSE, CAPILLARY: Glucose-Capillary: 90 mg/dL (ref 70–99)

## 2014-11-05 MED ORDER — FLUDEOXYGLUCOSE F - 18 (FDG) INJECTION
14.0000 | Freq: Once | INTRAVENOUS | Status: AC | PRN
Start: 2014-11-05 — End: 2014-11-05
  Administered 2014-11-05: 14 via INTRAVENOUS

## 2014-11-06 ENCOUNTER — Encounter: Payer: Self-pay | Admitting: Radiation Oncology

## 2014-11-06 ENCOUNTER — Ambulatory Visit
Admission: RE | Admit: 2014-11-06 | Discharge: 2014-11-06 | Disposition: A | Discharge status: Home / Self Care | Payer: Medicare Other | Source: Ambulatory Visit | Attending: Radiation Oncology | Admitting: Radiation Oncology

## 2014-11-06 VITALS — BP 147/119 | HR 58 | Temp 98.3°F | Resp 20 | Ht 66.0 in | Wt 277.7 lb

## 2014-11-06 DIAGNOSIS — E785 Hyperlipidemia, unspecified: Secondary | ICD-10-CM | POA: Insufficient documentation

## 2014-11-06 DIAGNOSIS — C7931 Secondary malignant neoplasm of brain: Secondary | ICD-10-CM

## 2014-11-06 DIAGNOSIS — Z7952 Long term (current) use of systemic steroids: Secondary | ICD-10-CM | POA: Insufficient documentation

## 2014-11-06 DIAGNOSIS — Z79899 Other long term (current) drug therapy: Secondary | ICD-10-CM | POA: Insufficient documentation

## 2014-11-06 DIAGNOSIS — F329 Major depressive disorder, single episode, unspecified: Secondary | ICD-10-CM | POA: Insufficient documentation

## 2014-11-06 DIAGNOSIS — F17200 Nicotine dependence, unspecified, uncomplicated: Secondary | ICD-10-CM | POA: Diagnosis not present

## 2014-11-06 DIAGNOSIS — Z6841 Body Mass Index (BMI) 40.0 and over, adult: Secondary | ICD-10-CM | POA: Diagnosis not present

## 2014-11-06 DIAGNOSIS — Z7982 Long term (current) use of aspirin: Secondary | ICD-10-CM | POA: Insufficient documentation

## 2014-11-06 DIAGNOSIS — J449 Chronic obstructive pulmonary disease, unspecified: Secondary | ICD-10-CM | POA: Insufficient documentation

## 2014-11-06 DIAGNOSIS — E669 Obesity, unspecified: Secondary | ICD-10-CM | POA: Insufficient documentation

## 2014-11-06 DIAGNOSIS — C7949 Secondary malignant neoplasm of other parts of nervous system: Principal | ICD-10-CM

## 2014-11-06 DIAGNOSIS — Z510 Encounter for antineoplastic radiation therapy: Secondary | ICD-10-CM | POA: Insufficient documentation

## 2014-11-06 DIAGNOSIS — C801 Malignant (primary) neoplasm, unspecified: Secondary | ICD-10-CM | POA: Insufficient documentation

## 2014-11-06 HISTORY — DX: Secondary malignant neoplasm of brain: C79.31

## 2014-11-06 NOTE — Progress Notes (Signed)
Please see the Nurse Progress Note in the MD Initial Consult Encounter for this patient. 

## 2014-11-06 NOTE — Progress Notes (Signed)
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 female who is seen out courtesy of Dr Alvy Bimler for an opinion concerning radiation therapy for what appears to be brain metastasis with unknown primary at this point. Patient recently presented with seizure activity in left upper arm.  she called the EMS and was transported Hospital. A head CT scan showed multiple small super tentorial and infratentorial lesions consistent with metastatic disease. Patient was placed on steroids and antiseizure medication with significant improvement in her mentation. She underwent the CT scans of the body which showed lymphadenopathy in the mediastinal and left hilar region. The patient was discharged after stabilization. She recently underwent a PET scan which shows activity in the mediastinal,  left hilar and left neck region. The patient was also noted to have activity in the rectal region and slight activity in a small nodule in the left lower lung. With this information the patient is now seen in radiation oncology.Marland Kitchen  PREVIOUS RADIATION THERAPY: No  PAST MEDICAL HISTORY:  has a past medical history of HTN (hypertension); Obesity; HLD (hyperlipidemia); Depression; and Brain metastases.    PAST SURGICAL HISTORY: Past Surgical History  Procedure Laterality Date  . Replacement total knee      FAMILY HISTORY: family history includes CAD in an other family member.  SOCIAL HISTORY:  reports that she has been smoking.  She does not have any smokeless tobacco history on file.  ALLERGIES: Review of patient's allergies indicates no known allergies.  MEDICATIONS:  Current Outpatient Prescriptions    Medication Sig Dispense Refill  . albuterol (PROVENTIL HFA;VENTOLIN HFA) 108 (90 BASE) MCG/ACT inhaler Inhale 1-2 puffs into the lungs every 4 (four) hours as needed for wheezing or shortness of breath. 2 Inhaler 0  . aspirin 81 MG tablet Take 81 mg by mouth daily.    . Black Cohosh 160 MG CAPS Take 160 mg by mouth daily.    . calcium-vitamin D (OSCAL WITH D) 500-200 MG-UNIT per tablet Take 1 tablet by mouth daily with breakfast.    . dexamethasone (DECADRON) 4 MG tablet Take 1 tablet (4 mg total) by mouth 4 (four) times daily. 60 tablet 0  . Garlic 10 MG CAPS Take 10 mg elemental calcium/kg/hr by mouth daily.    Marland Kitchen glucosamine-chondroitin 500-400 MG tablet Take 2 tablets by mouth daily.    Marland Kitchen levETIRAcetam (KEPPRA) 500 MG tablet Take 1 tablet (500 mg total) by mouth 2 (two) times daily. 60 tablet 0  . Melatonin 10 MG CAPS Take 10 mg by mouth at bedtime.    . Multiple Vitamin (MULTIVITAMIN) tablet Take 1 tablet by mouth daily. Silver centrum    . nicotine (NICODERM CQ - DOSED IN MG/24 HOURS) 21 mg/24hr patch Place 1 patch (21 mg total) onto the skin daily. 28 patch 0  . Omega-3 Fatty Acids (FISH OIL) 1000 MG CAPS Take 1,000 mg by mouth daily.    . pantoprazole (PROTONIX) 40 MG tablet Take 1 tablet (40 mg total) by mouth daily at 12 noon. 30 tablet 0  . PARoxetine (PAXIL) 20 MG tablet Take 20 mg by mouth daily.    . simvastatin (ZOCOR) 40 MG tablet Take 40 mg by  mouth daily.    Marland Kitchen zolpidem (AMBIEN) 5 MG tablet Take 1 tablet (5 mg total) by mouth at bedtime as needed for sleep. (Patient not taking: Reported on 11/06/2014) 30 tablet 0   No current facility-administered medications for this encounter.    REVIEW OF SYSTEMS:  A 15 point review of systems is documented in the electronic medical record. This was obtained by the nursing staff. However, I reviewed this with the patient to discuss relevant findings and make appropriate changes. She denies any headaches double vision or blurred vision at this  time. Patient admits to left eye lid drooping which is been present chronically and not a new problem. she denies any pain within the chest area cough or hemoptysis. Patient denies any shortness of breath. Patient smoked between one half and one pack of cigarettes per day for many years but stopped smoking once presented to the hospital. She denies any new bony pain. Patient occasionally will have some bleeding with bowel movements but no pain or change in stool caliber.   PHYSICAL EXAM:  height is 5\' 6"  (1.676 m) and weight is 277 lb 11.2 oz (125.964 kg). Her oral temperature is 98.3 F (36.8 C). Her blood pressure is 147/119 and her pulse is 58. Her respiration is 20 and oxygen saturation is 98%.   BP 147/119 mmHg  Pulse 58  Temp(Src) 98.3 F (36.8 C) (Oral)  Resp 20  Ht 5\' 6"  (1.676 m)  Wt 277 lb 11.2 oz (125.964 kg)  BMI 44.84 kg/m2  SpO2 98%  General Appearance:    Alert, cooperative, no distress, appears stated age, accompanied by good friend and brother on evaluation today   Head:    Normocephalic, without obvious abnormality, atraumatic  Eyes:    PERRL, conjunctiva/corneas clear, EOM's intact, some drooping of the left eyelid       Nose:   Nares normal, septum midline, mucosa normal, no drainage    or sinus tenderness  Throat:   Lips, mucosa, and tongue normal;  gums normal, no secondary infection   Neck:   Supple, symmetrical, trachea midline, palpable  left mid to upper neck node approximately 2 cm in size;   thyroid:  no enlargement/tenderness/nodules; no carotid   bruit or JVD  Back:     Symmetric, no curvature, ROM normal, no CVA tenderness  Lungs:     Clear to auscultation bilaterally, respirations unlabored  Chest Wall:    No tenderness or deformity   Heart:    Regular rate and rhythm, S1 and S2 normal, no murmur, rub   or gallop     Abdomen:     Soft, non-tender, bowel sounds active all four quadrants,    no masses, no organomegaly        Extremities:   Extremities  normal, atraumatic, no cyanosis or edema  Pulses:   2+ and symmetric all extremities  Skin:   Skin color, texture, turgor normal, no rashes or lesions  Lymph nodes:   Cervical, supraclavicular, and axillary nodes normal  Neurologic:    normal strength, sensation and reflexes    throughout     ECOG = 1    1 - Symptomatic but completely ambulatory (Restricted in physically strenuous activity but ambulatory and able to carry out work of a light or sedentary nature. For example, light housework, office work)  LABORATORY DATA:  Lab Results  Component Value Date   WBC 12.5* 10/30/2014   HGB 17.7* 10/30/2014   HCT 52.5* 10/30/2014  MCV 89.6 10/30/2014   PLT 208 10/30/2014   NEUTROABS 10.7* 10/28/2014   Lab Results  Component Value Date   NA 138 10/30/2014   K 4.9 10/30/2014   CL 106 10/30/2014   CO2 23 10/30/2014   GLUCOSE 102* 10/30/2014   CREATININE 0.84 10/30/2014   CALCIUM 9.1 10/30/2014      RADIOGRAPHY: Dg Chest 2 View  10/23/2014   CLINICAL DATA:  Pt states that she is very SOB and has been congested for the past week now, wheezing.  EXAM: CHEST  2 VIEW  COMPARISON:  01/09/2007  FINDINGS: Cardiac silhouette borderline enlarged. No mediastinal or hilar masses or evidence of adenopathy.  There is bilateral interstitial thickening. No areas of lung consolidation is seen. No pleural effusion or pneumothorax.  Bony thorax is diffusely demineralized but grossly intact.  IMPRESSION: Borderline cardiomegaly and bilateral interstitial thickening. Interstitial thickening may all be chronic, but has increased when compared to the prior chest radiograph. Mild congestive heart failure and interstitial edema is suspected.   Electronically Signed   By: Lajean Manes M.D.   On: 10/23/2014 16:50   Ct Head Wo Contrast  10/23/2014   CLINICAL DATA:  Right upper extremity weakness.  EXAM: CT HEAD WITHOUT CONTRAST  TECHNIQUE: Contiguous axial images were obtained from the base of the skull  through the vertex without intravenous contrast.  COMPARISON:  None.  FINDINGS: Skull and Sinuses:No evidence of bone metastasis. The mastoids and sinuses are clear.  Orbits: No acute abnormality.  Brain: There is diffuse abnormality of the cerebral hemispheres with asymmetric white matter low density that appears to have mass effect, effacing associated sulci. This is confluent throughout the right centrum semiovale and subcortical white matter, more patchy on the left. The appearance favors vasogenic edema as seen with tumor or infection. There are also areas of small cortical low-density in the lateral right temporal lobe, parasagittal left frontal lobe, and left parietal lobe. There is no midline shift, hydrocephalus, or acute hemorrhage.  These results were called by telephone at the time of interpretation on 10/23/2014 at 4:59 pm to Dr. Ernestina Patches , who verbally acknowledged these results.  IMPRESSION: Patchy vasogenic edema, usually seen with multi focal metastatic disease or infection. There is also multi focal cortical edema suspicious for superimposed infarcts. Brain MRI with contrast is recommended.   Electronically Signed   By: Monte Fantasia M.D.   On: 10/23/2014 17:04   Ct Angio Chest Pe W/cm &/or Wo Cm  10/24/2014   ADDENDUM REPORT: 10/24/2014 01:23  ADDENDUM: There is lymphadenopathy in the mediastinum involving the pretracheal, subcarinal, and left aortopulmonic window regions. Possible adenopathy in the left hilum. Appearance worrisome for metastases.   Electronically Signed   By: Lucienne Capers M.D.   On: 10/24/2014 01:23   10/24/2014   CLINICAL DATA:  Shortness of breath for 2 days, worse this morning.  EXAM: CT ANGIOGRAPHY CHEST WITH CONTRAST  TECHNIQUE: Multidetector CT imaging of the chest was performed using the standard protocol during bolus administration of intravenous contrast. Multiplanar CT image reconstructions and MIPs were obtained to evaluate the vascular anatomy.   CONTRAST:  112mL OMNIPAQUE IOHEXOL 350 MG/ML SOLN  COMPARISON:  None.  FINDINGS: Technically adequate study with moderately good opacification of the central and proximal segmental pulmonary arteries. Contrast bolus is somewhat limited and there is motion artifact, which limits evaluation of peripheral pulmonary arteries. No filling defects demonstrated suggesting no evidence of significant central pulmonary embolus.  Mild cardiac enlargement. Normal caliber  thoracic aorta. Aortic calcification. Great vessel origins appear patent. Coronary artery calcifications. Esophagus is decompressed. No significant lymphadenopathy in the chest.  Evaluation of lungs is limited due to respiratory motion artifact. There appears to be patchy airspace disease in the lungs which may indicate edema or multifocal pneumonia. No pleural effusions. No pneumothorax. Airways appear patent although the lower trachea is somewhat narrowed.  Included portions of the upper abdominal organs are grossly unremarkable. Degenerative changes throughout the spine. No destructive bone lesions.  Review of the MIP images confirms the above findings.  IMPRESSION: No evidence of significant central pulmonary embolus. Peripheral vessels are not well demonstrated. Cardiac enlargement with hazy parenchymal opacities suggesting congestive failure with edema.  Electronically Signed: By: Lucienne Capers M.D. On: 10/23/2014 19:32   Mr Jeri Cos ZO Contrast  10/24/2014   CLINICAL DATA:  Intermittent LEFT upper extremity thickening and seizure like symptoms that spontaneously resolve. Follow-up abnormal CT head.  EXAM: MRI HEAD WITHOUT AND WITH CONTRAST  TECHNIQUE: Multiplanar, multiecho pulse sequences of the brain and surrounding structures were obtained without and with intravenous contrast.  CONTRAST:  66mL MULTIHANCE GADOBENATE DIMEGLUMINE 529 MG/ML IV SOLN  COMPARISON:  CT head October 23, 2014  FINDINGS: Moderately motion degraded examination.  At least 6  inferred tentorial and greater than 20 supratentorial rim enhancing lesions at the gray-white matter junction, largest measuring up to 14 mm. 7 mm lesion in RIGHT thalamus. 10 mm lesion in LEFT posterior pons at the level of the superior peduncle. Many of the lesions show slight reduced diffusion. Limited assessment for blood products due to severely motion degraded susceptibility weighted imaging. Lesion show no intrinsic T1 shortening. Lesions are T2 bright with extends is associated vasogenic edema. 2 mm RIGHT to LEFT midline shift.  No hydrocephalus. No abnormal extra-axial fluid collections nor axial masses. No abnormal extra-axial enhancement the motion degrades sensitivity. Normal major intracranial vascular flow voids seen at the skull base.  Ocular globes and orbital contents are unremarkable. Paranasal sinuses and mastoid air cells are well aerated.  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 Consider correlation with CSF sampling, and possible PET-CT to assess for primary, as clinically indicated.   Electronically Signed   By: Elon Alas   On: 10/24/2014 01:02   Ct Abdomen Pelvis W Contrast  10/25/2014   CLINICAL DATA:  69 year old female with abnormal brain MRI suggesting metastatic disease. Subsequent encounter.  EXAM: CT ABDOMEN AND PELVIS WITH CONTRAST  TECHNIQUE: Multidetector CT imaging of the abdomen and pelvis was performed using the standard protocol following bolus administration of intravenous contrast.  CONTRAST:  159mL OMNIPAQUE IOHEXOL 300 MG/ML  SOLN  COMPARISON:  Chest CTA 10/23/14.  Brain MRI 10/23/14.  FINDINGS: Negative lung bases.   No acute osseous abnormality identified.  No pelvic free fluid. Negative uterus, adenexa, bladder, rectum. Severe Sigmoid diverticulosis, continues less pronounced to the splenic flexure. No active inflammation. Oral contrast at  the hepatic flexure. Negative more proximal colon. No dilated small bowel.  Negative stomach, duodenum, gallbladder, spleen, pancreas, adrenal glands. Negative kidneys; mild calcified atherosclerosis.  There is some hepatic steatosis.  Otherwise negative liver.  No free fluid. Aortoiliac calcified atherosclerosis noted. Major vascular structures are patent. No mass or lymphadenopathy.  IMPRESSION: 1. No primary tumor or metastatic disease identified in the abdomen or pelvis.  2.  Hepatic steatosis.  3.  Diverticulosis of the  colon.   Electronically Signed   By: Genevie Ann M.D.   On: 10/25/2014 01:40   Nm Pet Image Initial (pi) Skull Base To Thigh  11/05/2014   CLINICAL DATA:  Initial treatment strategy for lung cancer.  EXAM: NUCLEAR MEDICINE PET SKULL BASE TO THIGH  TECHNIQUE: 14.0 mCi F-18 FDG was injected intravenously. Full-ring PET imaging was performed from the skull base to thigh after the radiotracer. CT data was obtained and used for attenuation correction and anatomic localization.  FASTING BLOOD GLUCOSE:  Value: Ninety mg/dl  COMPARISON:  CT abdomen pelvis 10/25/2014 and CT chest 10/23/2014.  FINDINGS: NECK  There is a hypermetabolic 1.7 cm left level 2 lymph node, with an SUV max of 13.6. No additional hypermetabolic lymph nodes in the neck. CT images show scattered areas of predominantly white matter edema in the frontal and parietal lobes bilaterally.  CHEST  Hypermetabolic mediastinal and left hilar lymph nodes are seen. Index subcarinal lymph node measures 2.3 cm with an SUV max of 10.5. An 11 mm nodule in the medial left lower lobe has an SUV max of 3.1. CT images show no acute findings. Three-vessel coronary artery calcification. Heart is enlarged. No pericardial or pleural effusion.  ABDOMEN/PELVIS  No abnormal hypermetabolism in the liver, adrenal glands, spleen or pancreas. There is focal hypermetabolism associated with suspected eccentric rectal wall thickening, measuring approximately 2.4 x  2.7 cm (CT image 195), with an SUV max of 13.1. No adjacent adenopathy. No hypermetabolic lymph nodes. CT images show the liver, gallbladder, adrenal glands, kidneys, spleen, pancreas, stomach and bowel is otherwise grossly unremarkable. No free fluid.  SKELETON  No abnormal osseous hypermetabolism. Degenerative changes are seen in the spine.  IMPRESSION: 1. Hypermetabolic mediastinal, left hilar and left neck adenopathy with a mildly hypermetabolic left lower lobe nodule. 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 2. Possible rectal mass with associated hypermetabolism. 3. Three-vessel coronary artery calcification.   Electronically Signed   By: Lorin Picket M.D.   On: 11/05/2014 16:26   Dg Fluoro Guide Lumbar Puncture  10/28/2014   CLINICAL DATA:  69 year old female with metastatic disease to the brain of unknown primary.  EXAM: DIAGNOSTIC LUMBAR PUNCTURE UNDER FLUOROSCOPIC GUIDANCE  FLUOROSCOPY TIME:  1 min  PROCEDURE: Informed consent was obtained from the patient prior to the procedure, including potential complications of headache, allergy, and pain. With the patient prone, the lower back was prepped with Betadine. 1% Lidocaine was used for local anesthesia. Lumbar puncture was performed at the L3-L4 level using a 20 gauge needle with return of blood-tinged CSF with an opening pressure of 20 cm water. 6 ml of CSF were obtained for laboratory studies. The patient tolerated the procedure well and there were no apparent complications.  IMPRESSION: 1. Successful uncomplicated fluoroscopic guided lumbar puncture at L3-L4, as above.   Electronically Signed   By: Vinnie Langton M.D.   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 PET scan is consistent with possible lung primary. I did meet with interventional radiology today and the PET positive node in the left neck would be accessible to ultrasound-guided biopsy. I discussed this approach  with medical oncology who is in agreement to obtain tissue diagnosis this way. If this is unsuccessful then the patient will proceed with pulmonary evaluation which is scheduled for March 3 with Dr. Lamonte Sakai.  Patient does have activity in the rectal region on her PET scan and at a  later date may need workup of this area. It is doubtful that this is the primary site of her brain metastasis.  PLAN: Simulation and planning on March 1 for  treatments directed at the whole brain. The patient's appointment will be moved up if her mental status deteriorates in the next few days.  I anticipate 14 treatments directed at the whole brain. I spent 60 minutes minutes face to face with the patient and more than 50% of that time was spent in counseling and/or coordination of care.   ------------------------------------------------  Blair Promise, PhD, MD

## 2014-11-07 ENCOUNTER — Other Ambulatory Visit: Payer: Self-pay | Admitting: Radiology

## 2014-11-10 ENCOUNTER — Ambulatory Visit (HOSPITAL_COMMUNITY)
Admission: RE | Admit: 2014-11-10 | Discharge: 2014-11-10 | Disposition: A | Discharge status: Home / Self Care | Payer: Medicare Other | Source: Ambulatory Visit | Attending: Radiation Oncology | Admitting: Radiation Oncology

## 2014-11-10 ENCOUNTER — Other Ambulatory Visit: Payer: Self-pay | Admitting: Radiation Oncology

## 2014-11-10 ENCOUNTER — Encounter (HOSPITAL_COMMUNITY): Payer: Self-pay

## 2014-11-10 DIAGNOSIS — C770 Secondary and unspecified malignant neoplasm of lymph nodes of head, face and neck: Secondary | ICD-10-CM | POA: Diagnosis not present

## 2014-11-10 DIAGNOSIS — Z7982 Long term (current) use of aspirin: Secondary | ICD-10-CM | POA: Insufficient documentation

## 2014-11-10 DIAGNOSIS — E669 Obesity, unspecified: Secondary | ICD-10-CM | POA: Insufficient documentation

## 2014-11-10 DIAGNOSIS — Z96659 Presence of unspecified artificial knee joint: Secondary | ICD-10-CM | POA: Insufficient documentation

## 2014-11-10 DIAGNOSIS — R591 Generalized enlarged lymph nodes: Secondary | ICD-10-CM | POA: Insufficient documentation

## 2014-11-10 DIAGNOSIS — C7931 Secondary malignant neoplasm of brain: Secondary | ICD-10-CM

## 2014-11-10 DIAGNOSIS — F329 Major depressive disorder, single episode, unspecified: Secondary | ICD-10-CM | POA: Insufficient documentation

## 2014-11-10 DIAGNOSIS — F17210 Nicotine dependence, cigarettes, uncomplicated: Secondary | ICD-10-CM | POA: Diagnosis not present

## 2014-11-10 DIAGNOSIS — C801 Malignant (primary) neoplasm, unspecified: Secondary | ICD-10-CM | POA: Diagnosis not present

## 2014-11-10 DIAGNOSIS — R911 Solitary pulmonary nodule: Secondary | ICD-10-CM | POA: Insufficient documentation

## 2014-11-10 DIAGNOSIS — Z6841 Body Mass Index (BMI) 40.0 and over, adult: Secondary | ICD-10-CM | POA: Diagnosis not present

## 2014-11-10 DIAGNOSIS — G4733 Obstructive sleep apnea (adult) (pediatric): Secondary | ICD-10-CM | POA: Diagnosis not present

## 2014-11-10 DIAGNOSIS — E785 Hyperlipidemia, unspecified: Secondary | ICD-10-CM | POA: Insufficient documentation

## 2014-11-10 DIAGNOSIS — R590 Localized enlarged lymph nodes: Secondary | ICD-10-CM | POA: Diagnosis present

## 2014-11-10 DIAGNOSIS — I10 Essential (primary) hypertension: Secondary | ICD-10-CM | POA: Insufficient documentation

## 2014-11-10 HISTORY — DX: Chronic obstructive pulmonary disease, unspecified: J44.9

## 2014-11-10 LAB — CBC
HEMATOCRIT: 49.1 % — AB (ref 36.0–46.0)
HEMOGLOBIN: 16.3 g/dL — AB (ref 12.0–15.0)
MCH: 29.7 pg (ref 26.0–34.0)
MCHC: 33.2 g/dL (ref 30.0–36.0)
MCV: 89.6 fL (ref 78.0–100.0)
PLATELETS: 174 10*3/uL (ref 150–400)
RBC: 5.48 MIL/uL — AB (ref 3.87–5.11)
RDW: 14.5 % (ref 11.5–15.5)
WBC: 12.2 10*3/uL — ABNORMAL HIGH (ref 4.0–10.5)

## 2014-11-10 LAB — PROTIME-INR
INR: 0.99 (ref 0.00–1.49)
PROTHROMBIN TIME: 13.2 s (ref 11.6–15.2)

## 2014-11-10 LAB — APTT: aPTT: 21 seconds — ABNORMAL LOW (ref 24–37)

## 2014-11-10 MED ORDER — SODIUM CHLORIDE 0.9 % IV SOLN: INTRAVENOUS | Status: DC

## 2014-11-10 MED ORDER — FENTANYL CITRATE 0.05 MG/ML IJ SOLN
INTRAMUSCULAR | Status: AC
  Filled 2014-11-10: qty 4

## 2014-11-10 MED ORDER — NALOXONE HCL 0.4 MG/ML IJ SOLN
INTRAMUSCULAR | Status: AC
  Filled 2014-11-10: qty 1

## 2014-11-10 MED ORDER — FLUMAZENIL 0.5 MG/5ML IV SOLN
INTRAVENOUS | Status: AC
  Filled 2014-11-10: qty 5

## 2014-11-10 MED ORDER — MIDAZOLAM HCL 2 MG/2ML IJ SOLN
INTRAMUSCULAR | Status: AC
  Filled 2014-11-10: qty 6

## 2014-11-10 NOTE — H&P (Signed)
Chief Complaint: "I am here for a lymph node biopsy."  Referring Physician(s): Kinard,James D  History of Present Illness: Caitlin Carter is a 69 y.o. female with brain metastasis of unknown primary, PET done 04/30/55 and hypermetabolic LLL nodule and hypermetabolic lymphadenopathy seen, the patient has been seen by Dr. Sondra Come on 11/06/14 and scheduled today for image guided biopsy of left neck level 2 lymph node biopsy. She denies any chest pain, shortness of breath or palpitations. She denies any active signs of bleeding or excessive bruising. She denies any recent fever or chills. The patient does have OSA and has tolerated sedation before with a colonoscopy.   Past Medical History  Diagnosis Date  . HTN (hypertension)   . Obesity   . HLD (hyperlipidemia)   . Depression   . Brain metastases     Past Surgical History  Procedure Laterality Date  . Replacement total knee      Allergies: Review of patient's allergies indicates no known allergies.  Medications: Prior to Admission medications   Medication Sig Start Date End Date Taking? Authorizing Provider  albuterol (PROVENTIL HFA;VENTOLIN HFA) 108 (90 BASE) MCG/ACT inhaler Inhale 1-2 puffs into the lungs every 4 (four) hours as needed for wheezing or shortness of breath. 10/30/14   Allie Bossier, MD  aspirin 81 MG tablet Take 81 mg by mouth daily.    Historical Provider, MD  Black Cohosh 160 MG CAPS Take 160 mg by mouth daily.    Historical Provider, MD  calcium-vitamin D (OSCAL WITH D) 500-200 MG-UNIT per tablet Take 1 tablet by mouth daily with breakfast.    Historical Provider, MD  dexamethasone (DECADRON) 4 MG tablet Take 1 tablet (4 mg total) by mouth 4 (four) times daily. 10/30/14   Allie Bossier, MD  Garlic 10 MG CAPS Take 10 mg elemental calcium/kg/hr by mouth daily.    Historical Provider, MD  glucosamine-chondroitin 500-400 MG tablet Take 2 tablets by mouth daily.    Historical Provider, MD  levETIRAcetam  (KEPPRA) 500 MG tablet Take 1 tablet (500 mg total) by mouth 2 (two) times daily. 10/30/14   Allie Bossier, MD  Melatonin 10 MG CAPS Take 10 mg by mouth at bedtime.    Historical Provider, MD  Multiple Vitamin (MULTIVITAMIN) tablet Take 1 tablet by mouth daily. Silver centrum    Historical Provider, MD  nicotine (NICODERM CQ - DOSED IN MG/24 HOURS) 21 mg/24hr patch Place 1 patch (21 mg total) onto the skin daily. 10/30/14   Allie Bossier, MD  Omega-3 Fatty Acids (FISH OIL) 1000 MG CAPS Take 1,000 mg by mouth daily.    Historical Provider, MD  pantoprazole (PROTONIX) 40 MG tablet Take 1 tablet (40 mg total) by mouth daily at 12 noon. 10/30/14   Allie Bossier, MD  PARoxetine (PAXIL) 20 MG tablet Take 20 mg by mouth daily.    Historical Provider, MD  simvastatin (ZOCOR) 40 MG tablet Take 40 mg by mouth daily.    Historical Provider, MD  zolpidem (AMBIEN) 5 MG tablet Take 1 tablet (5 mg total) by mouth at bedtime as needed for sleep. Patient not taking: Reported on 11/06/2014 10/30/14   Allie Bossier, MD     Family History  Problem Relation Age of Onset  . CAD      History   Social History  . Marital Status: Widowed    Spouse Name: N/A  . Number of Children: N/A  . Years of Education: N/A  Social History Main Topics  . Smoking status: Heavy Tobacco Smoker  . Smokeless tobacco: Not on file     Comment: since age 38,quit 10/14/14,wearing a patch  . Alcohol Use: Not on file  . Drug Use: Not on file  . Sexual Activity: Not on file   Other Topics Concern  . None   Social History Narrative   Review of Systems: A 12 point ROS discussed and pertinent positives are indicated in the HPI above.  All other systems are negative.  Review of Systems  Vital Signs: T: 98.68F, HR: 52 bpm, BP: 119/86 mmHg, 02: 99% RA  Physical Exam  Constitutional: She is oriented to person, place, and time. No distress.  HENT:  Head: Normocephalic and atraumatic.  Left neck Lymphadenopathy  Cardiovascular:  Normal rate and regular rhythm.  Exam reveals no gallop and no friction rub.   No murmur heard. Pulmonary/Chest: Effort normal and breath sounds normal. No respiratory distress. She has no wheezes. She has no rales.  Abdominal: Soft. Bowel sounds are normal.  Neurological: She is alert and oriented to person, place, and time.  Skin: She is not diaphoretic.    Mallampati Score:  MD Evaluation Airway: WNL Heart: WNL Abdomen: WNL Chest/ Lungs: WNL ASA  Classification: 2 Mallampati/Airway Score: Two  Imaging: Dg Chest 2 View  10/23/2014   CLINICAL DATA:  Pt states that she is very SOB and has been congested for the past week now, wheezing.  EXAM: CHEST  2 VIEW  COMPARISON:  01/09/2007  FINDINGS: Cardiac silhouette borderline enlarged. No mediastinal or hilar masses or evidence of adenopathy.  There is bilateral interstitial thickening. No areas of lung consolidation is seen. No pleural effusion or pneumothorax.  Bony thorax is diffusely demineralized but grossly intact.  IMPRESSION: Borderline cardiomegaly and bilateral interstitial thickening. Interstitial thickening may all be chronic, but has increased when compared to the prior chest radiograph. Mild congestive heart failure and interstitial edema is suspected.   Electronically Signed   By: Lajean Manes M.D.   On: 10/23/2014 16:50   Ct Head Wo Contrast  10/23/2014   CLINICAL DATA:  Right upper extremity weakness.  EXAM: CT HEAD WITHOUT CONTRAST  TECHNIQUE: Contiguous axial images were obtained from the base of the skull through the vertex without intravenous contrast.  COMPARISON:  None.  FINDINGS: Skull and Sinuses:No evidence of bone metastasis. The mastoids and sinuses are clear.  Orbits: No acute abnormality.  Brain: There is diffuse abnormality of the cerebral hemispheres with asymmetric white matter low density that appears to have mass effect, effacing associated sulci. This is confluent throughout the right centrum semiovale and  subcortical white matter, more patchy on the left. The appearance favors vasogenic edema as seen with tumor or infection. There are also areas of small cortical low-density in the lateral right temporal lobe, parasagittal left frontal lobe, and left parietal lobe. There is no midline shift, hydrocephalus, or acute hemorrhage.  These results were called by telephone at the time of interpretation on 10/23/2014 at 4:59 pm to Dr. Ernestina Patches , who verbally acknowledged these results.  IMPRESSION: Patchy vasogenic edema, usually seen with multi focal metastatic disease or infection. There is also multi focal cortical edema suspicious for superimposed infarcts. Brain MRI with contrast is recommended.   Electronically Signed   By: Monte Fantasia M.D.   On: 10/23/2014 17:04   Ct Angio Chest Pe W/cm &/or Wo Cm  10/24/2014   ADDENDUM REPORT: 10/24/2014 01:23  ADDENDUM: There is lymphadenopathy  in the mediastinum involving the pretracheal, subcarinal, and left aortopulmonic window regions. Possible adenopathy in the left hilum. Appearance worrisome for metastases.   Electronically Signed   By: Lucienne Capers M.D.   On: 10/24/2014 01:23   10/24/2014   CLINICAL DATA:  Shortness of breath for 2 days, worse this morning.  EXAM: CT ANGIOGRAPHY CHEST WITH CONTRAST  TECHNIQUE: Multidetector CT imaging of the chest was performed using the standard protocol during bolus administration of intravenous contrast. Multiplanar CT image reconstructions and MIPs were obtained to evaluate the vascular anatomy.  CONTRAST:  138mL OMNIPAQUE IOHEXOL 350 MG/ML SOLN  COMPARISON:  None.  FINDINGS: Technically adequate study with moderately good opacification of the central and proximal segmental pulmonary arteries. Contrast bolus is somewhat limited and there is motion artifact, which limits evaluation of peripheral pulmonary arteries. No filling defects demonstrated suggesting no evidence of significant central pulmonary embolus.  Mild  cardiac enlargement. Normal caliber thoracic aorta. Aortic calcification. Great vessel origins appear patent. Coronary artery calcifications. Esophagus is decompressed. No significant lymphadenopathy in the chest.  Evaluation of lungs is limited due to respiratory motion artifact. There appears to be patchy airspace disease in the lungs which may indicate edema or multifocal pneumonia. No pleural effusions. No pneumothorax. Airways appear patent although the lower trachea is somewhat narrowed.  Included portions of the upper abdominal organs are grossly unremarkable. Degenerative changes throughout the spine. No destructive bone lesions.  Review of the MIP images confirms the above findings.  IMPRESSION: No evidence of significant central pulmonary embolus. Peripheral vessels are not well demonstrated. Cardiac enlargement with hazy parenchymal opacities suggesting congestive failure with edema.  Electronically Signed: By: Lucienne Capers M.D. On: 10/23/2014 19:32   Mr Jeri Cos CX Contrast  10/24/2014   CLINICAL DATA:  Intermittent LEFT upper extremity thickening and seizure like symptoms that spontaneously resolve. Follow-up abnormal CT head.  EXAM: MRI HEAD WITHOUT AND WITH CONTRAST  TECHNIQUE: Multiplanar, multiecho pulse sequences of the brain and surrounding structures were obtained without and with intravenous contrast.  CONTRAST:  2mL MULTIHANCE GADOBENATE DIMEGLUMINE 529 MG/ML IV SOLN  COMPARISON:  CT head October 23, 2014  FINDINGS: Moderately motion degraded examination.  At least 6 inferred tentorial and greater than 20 supratentorial rim enhancing lesions at the gray-white matter junction, largest measuring up to 14 mm. 7 mm lesion in RIGHT thalamus. 10 mm lesion in LEFT posterior pons at the level of the superior peduncle. Many of the lesions show slight reduced diffusion. Limited assessment for blood products due to severely motion degraded susceptibility weighted imaging. Lesion show no intrinsic  T1 shortening. Lesions are T2 bright with extends is associated vasogenic edema. 2 mm RIGHT to LEFT midline shift.  No hydrocephalus. No abnormal extra-axial fluid collections nor axial masses. No abnormal extra-axial enhancement the motion degrades sensitivity. Normal major intracranial vascular flow voids seen at the skull base.  Ocular globes and orbital contents are unremarkable. Paranasal sinuses and mastoid air cells are well aerated.  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 Consider correlation with CSF sampling, and possible PET-CT to assess for primary, as clinically indicated.   Electronically Signed   By: Elon Alas   On: 10/24/2014 01:02   Ct Abdomen Pelvis W Contrast  10/25/2014   CLINICAL DATA:  69 year old female with abnormal brain MRI suggesting metastatic disease. Subsequent encounter.  EXAM: CT ABDOMEN AND PELVIS  WITH CONTRAST  TECHNIQUE: Multidetector CT imaging of the abdomen and pelvis was performed using the standard protocol following bolus administration of intravenous contrast.  CONTRAST:  199mL OMNIPAQUE IOHEXOL 300 MG/ML  SOLN  COMPARISON:  Chest CTA 10/23/14.  Brain MRI 10/23/14.  FINDINGS: Negative lung bases.   No acute osseous abnormality identified.  No pelvic free fluid. Negative uterus, adenexa, bladder, rectum. Severe Sigmoid diverticulosis, continues less pronounced to the splenic flexure. No active inflammation. Oral contrast at the hepatic flexure. Negative more proximal colon. No dilated small bowel.  Negative stomach, duodenum, gallbladder, spleen, pancreas, adrenal glands. Negative kidneys; mild calcified atherosclerosis.  There is some hepatic steatosis.  Otherwise negative liver.  No free fluid. Aortoiliac calcified atherosclerosis noted. Major vascular structures are patent. No mass or lymphadenopathy.  IMPRESSION: 1. No primary tumor or  metastatic disease identified in the abdomen or pelvis.  2.  Hepatic steatosis.  3.  Diverticulosis of the colon.   Electronically Signed   By: Genevie Ann M.D.   On: 10/25/2014 01:40   Nm Pet Image Initial (pi) Skull Base To Thigh  11/05/2014   CLINICAL DATA:  Initial treatment strategy for lung cancer.  EXAM: NUCLEAR MEDICINE PET SKULL BASE TO THIGH  TECHNIQUE: 14.0 mCi F-18 FDG was injected intravenously. Full-ring PET imaging was performed from the skull base to thigh after the radiotracer. CT data was obtained and used for attenuation correction and anatomic localization.  FASTING BLOOD GLUCOSE:  Value: Ninety mg/dl  COMPARISON:  CT abdomen pelvis 10/25/2014 and CT chest 10/23/2014.  FINDINGS: NECK  There is a hypermetabolic 1.7 cm left level 2 lymph node, with an SUV max of 13.6. No additional hypermetabolic lymph nodes in the neck. CT images show scattered areas of predominantly white matter edema in the frontal and parietal lobes bilaterally.  CHEST  Hypermetabolic mediastinal and left hilar lymph nodes are seen. Index subcarinal lymph node measures 2.3 cm with an SUV max of 10.5. An 11 mm nodule in the medial left lower lobe has an SUV max of 3.1. CT images show no acute findings. Three-vessel coronary artery calcification. Heart is enlarged. No pericardial or pleural effusion.  ABDOMEN/PELVIS  No abnormal hypermetabolism in the liver, adrenal glands, spleen or pancreas. There is focal hypermetabolism associated with suspected eccentric rectal wall thickening, measuring approximately 2.4 x 2.7 cm (CT image 195), with an SUV max of 13.1. No adjacent adenopathy. No hypermetabolic lymph nodes. CT images show the liver, gallbladder, adrenal glands, kidneys, spleen, pancreas, stomach and bowel is otherwise grossly unremarkable. No free fluid.  SKELETON  No abnormal osseous hypermetabolism. Degenerative changes are seen in the spine.  IMPRESSION: 1. Hypermetabolic mediastinal, left hilar and left neck adenopathy  with a mildly hypermetabolic left lower lobe nodule. 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 2. Possible rectal mass with associated hypermetabolism. 3. Three-vessel coronary artery calcification.   Electronically Signed   By: Lorin Picket M.D.   On: 11/05/2014 16:26   Dg Fluoro Guide Lumbar Puncture  10/28/2014   CLINICAL DATA:  69 year old female with metastatic disease to the brain of unknown primary.  EXAM: DIAGNOSTIC LUMBAR PUNCTURE UNDER FLUOROSCOPIC GUIDANCE  FLUOROSCOPY TIME:  1 min  PROCEDURE: Informed consent was obtained from the patient prior to the procedure, including potential complications of headache, allergy, and pain. With the patient prone, the lower back was prepped with Betadine. 1% Lidocaine was used for local anesthesia. Lumbar puncture was performed at the L3-L4 level using a 20  gauge needle with return of blood-tinged CSF with an opening pressure of 20 cm water. 6 ml of CSF were obtained for laboratory studies. The patient tolerated the procedure well and there were no apparent complications.  IMPRESSION: 1. Successful uncomplicated fluoroscopic guided lumbar puncture at L3-L4, as above.   Electronically Signed   By: Vinnie Langton M.D.   On: 10/28/2014 13:38    Labs:  CBC:  Recent Labs  10/26/14 0258 10/27/14 0240 10/28/14 2248 10/30/14 0330  WBC 13.5* 11.1* 13.0* 12.5*  HGB 15.4* 16.0* 17.5* 17.7*  HCT 48.1* 51.3* 53.6* 52.5*  PLT 205 210 229 208    COAGS:  Recent Labs  10/23/14 1542  INR 0.99  APTT 32    BMP:  Recent Labs  10/27/14 0240 10/28/14 0307 10/28/14 2248 10/30/14 0330  NA 141 139 138 138  K 4.8 4.3 4.8 4.9  CL 103 101 102 106  CO2 31 24 27 23   GLUCOSE 108* 109* 137* 102*  BUN 27* 38* 41* 30*  CALCIUM 9.3 9.8 9.2 9.1  CREATININE 1.00 0.97 1.34* 0.84  GFRNONAA 57* 59* 40* 70*  GFRAA 66* 68* 46* 81*    LIVER FUNCTION TESTS:  Recent Labs  10/25/14 0400 10/26/14 0258 10/27/14 0240  10/28/14 2248  BILITOT 0.4 0.8 0.7 0.5  AST 114* 71* 36 27  ALT 71* 62* 55* 47*  ALKPHOS 64 59 60 66  PROT 7.1 7.1 7.0 6.9  ALBUMIN 3.5 3.6 3.4* 3.7   Assessment and Plan: Brain metastasis of unknown primary Hypermetabolic LLL nodule Lymphadenopathy, hypermetabolic on PET PET 06/05/45 Seen by Dr. Sondra Come on 11/06/14  Scheduled today for image guided biopsy of left neck level 2 lymph node biopsy with moderate sedation OSA Risks and Benefits discussed with the patient including, but not limited to bleeding, infection or low yield requiring additional procedures. All of the patient's questions were answered, patient is agreeable to proceed. Consent signed and in chart.   Thank you for this interesting consult.  I greatly enjoyed meeting TANISE RUSSMAN and look forward to participating in their care.  SignedHedy Jacob 11/10/2014, 12:11 PM   I spent a total of 20 Minutes in face to face in clinical consultation, greater than 50% of which was counseling/coordinating care for

## 2014-11-10 NOTE — Discharge Instructions (Signed)
Conscious Sedation Sedation is the use of medicines to promote relaxation and relieve discomfort and anxiety. Conscious sedation is a type of sedation. Under conscious sedation you are less alert than normal but are still able to respond to instructions or stimulation. Conscious sedation is used during short medical and dental procedures. It is milder than deep sedation or general anesthesia and allows you to return to your regular activities sooner.  LET [facility] CARE PROVIDER KNOW ABOUT:   Any allergies you have.  All medicines you are taking, including vitamins, herbs, eye drops, creams, and over-the-counter medicines.  Use of steroids (by mouth or creams).  Previous problems you or members of your family have had with the use of anesthetics.  Any blood disorders you have.  Previous surgeries you have had.  Medical conditions you have.  Possibility of pregnancy, if this applies.  Use of cigarettes, alcohol, or illegal drugs. RISKS AND COMPLICATIONS Generally, this is a safe procedure. However, as with any procedure, problems can occur. Possible problems include:  Oversedation.  Trouble breathing on your own. You may need to have a breathing tube until you are awake and breathing on your own.  Allergic reaction to any of the medicines used for the procedure. BEFORE THE PROCEDURE  You may have blood tests done. These tests can help show how well your kidneys and liver are working. They can also show how well your blood clots.  A physical exam will be done.  Only take medicines as directed by your health care provider. You may need to stop taking medicines (such as blood thinners, aspirin, or nonsteroidal anti-inflammatory drugs) before the procedure.   Do not eat or drink at least 6 hours before the procedure or as directed by your health care provider.  Arrange for a responsible adult, family member, or friend to take you home after the procedure. He or she should stay  with you for at least 24 hours after the procedure, until the medicine has worn off. PROCEDURE   An intravenous (IV) catheter will be inserted into one of your veins. Medicine will be able to flow directly into your body through this catheter. You may be given medicine through this tube to help prevent pain and help you relax.  The medical or dental procedure will be done. AFTER THE PROCEDURE  You will stay in a recovery area until the medicine has worn off. Your blood pressure and pulse will be checked.   Depending on the procedure you had, you may be allowed to go home when you can tolerate liquids and your pain is under control. Document Released: 05/24/2001 Document Revised: 09/03/2013 Document Reviewed: 05/06/2013 [facility] Patient Information 2015 [place], [place]. This information is not intended to replace advice given to you by your health care provider. Make sure you discuss any questions you have with your health care provider.

## 2014-11-10 NOTE — Procedures (Signed)
US guided FNA of left submandibular nodule.  6 FNAs obtained.  No immediate complication.

## 2014-11-11 ENCOUNTER — Ambulatory Visit
Admission: RE | Admit: 2014-11-11 | Discharge: 2014-11-11 | Disposition: A | Discharge status: Home / Self Care | Payer: Medicare Other | Source: Ambulatory Visit | Attending: Radiation Oncology | Admitting: Radiation Oncology

## 2014-11-11 DIAGNOSIS — Z510 Encounter for antineoplastic radiation therapy: Secondary | ICD-10-CM | POA: Diagnosis not present

## 2014-11-11 DIAGNOSIS — C7931 Secondary malignant neoplasm of brain: Secondary | ICD-10-CM

## 2014-11-11 NOTE — Progress Notes (Signed)
  Radiation Oncology         [phone] ________________________________  Name: KAHLIE DEUTSCHER MRN: [id number]  Date: 11/11/2014  DOB: 04-28-1946  SIMULATION AND TREATMENT PLANNING NOTE    ICD-9-CM ICD-10-CM   1. Metastatic cancer to brain of unknown cell type 198.3 C79.31     DIAGNOSIS:  Brain metastasis, unknown primary  NARRATIVE:  The patient was brought to the [facility].  Identity was confirmed.  All relevant records and images related to the planned course of therapy were reviewed.  The patient freely provided informed written consent to proceed with treatment after reviewing the details related to the planned course of therapy. The consent form was witnessed and verified by the simulation staff.  Then, the patient was set-up in a stable reproducible  supine position for radiation therapy.  CT images were obtained.  Surface markings were placed.  The CT images were loaded into the planning software.  Then the target and avoidance structures were contoured.  Treatment planning then occurred.  The radiation prescription was entered and confirmed.  Then, I designed and supervised the construction of a total of 3 medically necessary complex treatment devices.  I have requested : Isodose Plan.  I have ordered:dose calc.  PLAN:  The patient will receive 35 Gy in 14 fractions.  ________________________________  Gery Pray, MD

## 2014-11-12 ENCOUNTER — Institutional Professional Consult (permissible substitution): Payer: Self-pay | Admitting: Pulmonary Disease

## 2014-11-12 ENCOUNTER — Encounter: Payer: Self-pay | Admitting: Cardiology

## 2014-11-12 ENCOUNTER — Ambulatory Visit (INDEPENDENT_AMBULATORY_CARE_PROVIDER_SITE_OTHER): Payer: Medicare Other | Admitting: Cardiology

## 2014-11-12 VITALS — BP 100/52 | HR 90 | Ht 65.0 in | Wt 275.0 lb

## 2014-11-12 DIAGNOSIS — Z9989 Dependence on other enabling machines and devices: Secondary | ICD-10-CM

## 2014-11-12 DIAGNOSIS — I10 Essential (primary) hypertension: Secondary | ICD-10-CM

## 2014-11-12 DIAGNOSIS — C801 Malignant (primary) neoplasm, unspecified: Secondary | ICD-10-CM

## 2014-11-12 DIAGNOSIS — E785 Hyperlipidemia, unspecified: Secondary | ICD-10-CM

## 2014-11-12 DIAGNOSIS — R7989 Other specified abnormal findings of blood chemistry: Secondary | ICD-10-CM

## 2014-11-12 DIAGNOSIS — G4733 Obstructive sleep apnea (adult) (pediatric): Secondary | ICD-10-CM

## 2014-11-12 DIAGNOSIS — Z720 Tobacco use: Secondary | ICD-10-CM

## 2014-11-12 DIAGNOSIS — J438 Other emphysema: Secondary | ICD-10-CM

## 2014-11-12 DIAGNOSIS — E669 Obesity, unspecified: Secondary | ICD-10-CM

## 2014-11-12 DIAGNOSIS — C7931 Secondary malignant neoplasm of brain: Secondary | ICD-10-CM

## 2014-11-12 DIAGNOSIS — R778 Other specified abnormalities of plasma proteins: Secondary | ICD-10-CM

## 2014-11-12 MED ORDER — LISINOPRIL-HYDROCHLOROTHIAZIDE 20-25 MG PO TABS: 1.0000 | ORAL_TABLET | Freq: Every day | ORAL | Status: DC

## 2014-11-12 NOTE — Assessment & Plan Note (Signed)
BMI 45 

## 2014-11-12 NOTE — Patient Instructions (Addendum)
Your physician has recommended you make the following change in your medication:  1) DECREASE Lisinopril-HCTZ (Prinzide) 20/25mg  to once daily.  Your physician recommends that you schedule a follow-up appointment in: 2 months with Dr. Meda Coffee

## 2014-11-12 NOTE — Assessment & Plan Note (Signed)
On Zocor

## 2014-11-12 NOTE — Assessment & Plan Note (Signed)
To start radiation tomorrow

## 2014-11-12 NOTE — Assessment & Plan Note (Signed)
Pt says she has quit

## 2014-11-12 NOTE — Assessment & Plan Note (Addendum)
Seen in the hospital in consult for elevated Troponin, felt to be secondary to demand ischemia, CHF

## 2014-11-12 NOTE — Assessment & Plan Note (Signed)
B/P is trending low

## 2014-11-12 NOTE — Progress Notes (Signed)
11/12/2014 Caitlin Carter   August 31, 1946  [id number]  Primary Physician Antony Blackbird, MD Primary Cardiologist: Dr Meda Coffee  HPI:  69 y/o obese female, smoker, HTN, and dyslipidemia but no prior CAD, seen in the hospital recently for an elevated Troponin. She had presented 10/24/14 with Rt arm weakness. CT showed multiple brain lesions. We were asked to see her for an elevated Troponin (1.32) and volume overload. The pt has no history of angina. An echo done showed preserved LVF and grade 1 diastolic dysfunction. She is in the office today for follow up. She has done well since discharge. Not smoking. SOB improved. She is to start brain radiation Rx tomorrow.   Current Outpatient Prescriptions  Medication Sig Dispense Refill  . albuterol (PROVENTIL HFA;VENTOLIN HFA) 108 (90 BASE) MCG/ACT inhaler Inhale 1-2 puffs into the lungs every 4 (four) hours as needed for wheezing or shortness of breath. 2 Inhaler 0  . aspirin 81 MG tablet Take 81 mg by mouth daily.    . Black Cohosh 160 MG CAPS Take 160 mg by mouth daily.    . calcium-vitamin D (OSCAL WITH D) 500-200 MG-UNIT per tablet Take 1 tablet by mouth daily with breakfast.    . dexamethasone (DECADRON) 4 MG tablet Take 1 tablet (4 mg total) by mouth 4 (four) times daily. 60 tablet 0  . Garlic 10 MG CAPS Take 10 mg elemental calcium/kg/hr by mouth daily.    Marland Kitchen glucosamine-chondroitin 500-400 MG tablet Take 2 tablets by mouth daily.    Marland Kitchen levETIRAcetam (KEPPRA) 500 MG tablet Take 1 tablet (500 mg total) by mouth 2 (two) times daily. 60 tablet 0  . Melatonin 10 MG CAPS Take 10 mg by mouth at bedtime as needed (sleep).     . Multiple Vitamin (MULTIVITAMIN) tablet Take 1 tablet by mouth daily. Silver centrum    . nicotine (NICODERM CQ - DOSED IN MG/24 HOURS) 21 mg/24hr patch Place 1 patch (21 mg total) onto the skin daily. 28 patch 0  . Omega-3 Fatty Acids (FISH OIL) 1000 MG CAPS Take 1,000 mg by mouth daily.    . pantoprazole (PROTONIX) 40 MG tablet  Take 1 tablet (40 mg total) by mouth daily at 12 noon. 30 tablet 0  . PARoxetine (PAXIL) 20 MG tablet Take 20 mg by mouth daily as needed (restlessness).     . simvastatin (ZOCOR) 40 MG tablet Take 40 mg by mouth daily.    Marland Kitchen zolpidem (AMBIEN) 5 MG tablet Take 1 tablet (5 mg total) by mouth at bedtime as needed for sleep. 30 tablet 0  . lisinopril-hydrochlorothiazide (PRINZIDE,ZESTORETIC) 20-25 MG per tablet Take 1 tablet by mouth daily. 30 tablet 6   No current facility-administered medications for this visit.    No Known Allergies  History   Social History  . Marital Status: Widowed    Spouse Name: N/A  . Number of Children: N/A  . Years of Education: N/A   Occupational History  . Not on file.   Social History Main Topics  . Smoking status: Heavy Tobacco Smoker  . Smokeless tobacco: Not on file     Comment: since age 21,quit 10/14/14,wearing a patch  . Alcohol Use: Not on file  . Drug Use: Not on file  . Sexual Activity: Not on file   Other Topics Concern  . Not on file   Social History Narrative     Review of Systems: General: negative for chills, fever, night sweats or weight changes.  Cardiovascular: negative  for chest pain, dyspnea on exertion, edema, orthopnea, palpitations, paroxysmal nocturnal dyspnea or shortness of breath Dermatological: negative for rash Respiratory: negative for cough or wheezing Urologic: negative for hematuria Abdominal: negative for nausea, vomiting, diarrhea, bright red blood per rectum, melena, or hematemesis Neurologic: negative for visual changes, syncope, or dizziness All other systems reviewed and are otherwise negative except as noted above.    Blood pressure 100/52, pulse 90, height 5\' 5"  (1.651 m), weight 275 lb (124.739 kg), SpO2 94 %.  General appearance: alert, cooperative, no distress and morbidly obese Neck: no JVD Lungs: decreased breath sounds, no wheezing Heart: regular rate and rhythm Extremities: trace edema  EKG  On 10/24/14- NSR without acute changes  ASSESSMENT AND PLAN:   Troponin I above reference range Seen in the hospital in consult for elevated Troponin, felt to be secondary to demand ischemia, CHF  OSA on CPAP . COPD (chronic obstructive pulmonary disease) . 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  I see no resume to pursue further cardiac work up at this time. She will follow up with Dr Meda Coffee after she completes radiation Rx  [facility] 11/12/2014 12:56 PM

## 2014-11-13 ENCOUNTER — Ambulatory Visit (INDEPENDENT_AMBULATORY_CARE_PROVIDER_SITE_OTHER): Payer: Medicare Other | Admitting: Emergency Medicine

## 2014-11-13 ENCOUNTER — Ambulatory Visit
Admission: RE | Admit: 2014-11-13 | Discharge: 2014-11-13 | Disposition: A | Discharge status: Home / Self Care | Payer: Medicare Other | Source: Ambulatory Visit | Attending: Radiation Oncology | Admitting: Radiation Oncology

## 2014-11-13 ENCOUNTER — Encounter: Payer: Self-pay | Admitting: Radiation Oncology

## 2014-11-13 ENCOUNTER — Encounter: Payer: Self-pay | Admitting: Emergency Medicine

## 2014-11-13 VITALS — BP 126/62 | HR 58 | Temp 97.1°F | Ht 65.0 in | Wt 276.0 lb

## 2014-11-13 VITALS — BP 121/53 | HR 60 | Temp 98.8°F | Resp 20 | Ht 65.0 in | Wt 277.0 lb

## 2014-11-13 DIAGNOSIS — C7931 Secondary malignant neoplasm of brain: Secondary | ICD-10-CM

## 2014-11-13 DIAGNOSIS — R198 Other specified symptoms and signs involving the digestive system and abdomen: Secondary | ICD-10-CM

## 2014-11-13 DIAGNOSIS — C801 Malignant (primary) neoplasm, unspecified: Secondary | ICD-10-CM

## 2014-11-13 DIAGNOSIS — Z510 Encounter for antineoplastic radiation therapy: Secondary | ICD-10-CM | POA: Diagnosis not present

## 2014-11-13 DIAGNOSIS — K6289 Other specified diseases of anus and rectum: Secondary | ICD-10-CM

## 2014-11-13 DIAGNOSIS — J438 Other emphysema: Secondary | ICD-10-CM

## 2014-11-13 NOTE — Patient Instructions (Signed)
We will arrange for awe will refer you for a gastroenterology appointment to discuss and evaluate the possible rectal mass on your PET scan If Dr Alvy Bimler does not believe that you need to see GI then we can cancel  this appointment.  We will follow up in the future to discuss your lung function given your smoking history.  Follow with Dr Lamonte Sakai in 4 months or sooner if you have any problems.

## 2014-11-13 NOTE — Assessment & Plan Note (Signed)
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 She is already undergoing whole brain radiation. I will refer her to gastroenterology to see if she needs a colonoscopy to clarify whether there is a colon cancer present. If Dr. Alvy Bimler  with oncology does not feel that this will change management and we can always cancel his appointment

## 2014-11-13 NOTE — Assessment & Plan Note (Signed)
I will follow with her after her cancer evaluation and treatment is proceeding. We will perform  function testing at that time

## 2014-11-13 NOTE — Progress Notes (Signed)
Subjective:    Patient ID: Caitlin Carter, female    DOB: 10-26-1945, 69 y.o.   MRN: [id number]  HPI 69 year old woman with a history of tobacco abuse (50 pack years) hypertension, obesity, OSA on CPAP. She was admitted in mid February 2016 with upper extremity  weakness. An evaluation revealed vasogenic edema and concern for brain metastases. These were confirmed on brain MRI on 10/24/14. A CT scan of her chest showed mediastinal and left hilar lymphadenopathy. She underwent PET scan on 11/05/14 as an outpatient that showed hypermetabolism in her mediastinal nodes as well as a small left lower lobe nodule. Also noted was a possible rectal mass with associated hypermetabolism. She underwent needle bx of a neck node that was positive for adenoCA of lung primary.   Review of Systems  Constitutional: Negative for fever and unexpected weight change.  HENT: Negative for congestion, dental problem, ear pain, nosebleeds, postnasal drip, rhinorrhea, sinus pressure, sneezing, sore throat and trouble swallowing.   Eyes: Negative for redness and itching.  Respiratory: Negative for cough, chest tightness, shortness of breath and wheezing.   Cardiovascular: Negative for palpitations and leg swelling.  Gastrointestinal: Negative for nausea and vomiting.  Genitourinary: Negative for dysuria.  Musculoskeletal: Negative for joint swelling.  Skin: Negative for rash.  Neurological: Negative for headaches.  Hematological: Does not bruise/bleed easily.  Psychiatric/Behavioral: Negative for dysphoric mood. The patient is not nervous/anxious.    Past Medical History  Diagnosis Date  . HTN (hypertension)   . Obesity   . HLD (hyperlipidemia)   . Depression   . Brain metastases   . COPD (chronic obstructive pulmonary disease)      Family History  Problem Relation Age of Onset  . CAD    . Diabetes Mother   . Breast cancer Mother   . Heart attack Father   . Lung cancer Father      History   Social  History  . Marital Status: Widowed    Spouse Name: N/A  . Number of Children: N/A  . Years of Education: N/A   Occupational History  . Not on file.   Social History Main Topics  . Smoking status: Former Smoker -- 1.00 packs/day for 51 years    Types: Cigarettes    Start date: 09/13/1963    Quit date: 10/14/2014  . Smokeless tobacco: Never Used  . Alcohol Use: Not on file  . Drug Use: Not on file  . Sexual Activity: Not on file   Other Topics Concern  . Not on file   Social History Narrative     No Known Allergies   Outpatient Prescriptions Prior to Visit  Medication Sig Dispense Refill  . albuterol (PROVENTIL HFA;VENTOLIN HFA) 108 (90 BASE) MCG/ACT inhaler Inhale 1-2 puffs into the lungs every 4 (four) hours as needed for wheezing or shortness of breath. 2 Inhaler 0  . aspirin 81 MG tablet Take 81 mg by mouth daily.    . Black Cohosh 160 MG CAPS Take 160 mg by mouth daily.    . calcium-vitamin D (OSCAL WITH D) 500-200 MG-UNIT per tablet Take 1 tablet by mouth daily with breakfast.    . dexamethasone (DECADRON) 4 MG tablet Take 1 tablet (4 mg total) by mouth 4 (four) times daily. 60 tablet 0  . Garlic 10 MG CAPS Take 10 mg elemental calcium/kg/hr by mouth daily.    Marland Kitchen glucosamine-chondroitin 500-400 MG tablet Take 2 tablets by mouth daily.    Marland Kitchen levETIRAcetam (KEPPRA) 500 MG  tablet Take 1 tablet (500 mg total) by mouth 2 (two) times daily. 60 tablet 0  . lisinopril-hydrochlorothiazide (PRINZIDE,ZESTORETIC) 20-25 MG per tablet Take 1 tablet by mouth daily. 30 tablet 6  . Melatonin 10 MG CAPS Take 10 mg by mouth at bedtime as needed (sleep).     . Multiple Vitamin (MULTIVITAMIN) tablet Take 1 tablet by mouth daily. Silver centrum    . nicotine (NICODERM CQ - DOSED IN MG/24 HOURS) 21 mg/24hr patch Place 1 patch (21 mg total) onto the skin daily. 28 patch 0  . Omega-3 Fatty Acids (FISH OIL) 1000 MG CAPS Take 1,000 mg by mouth daily.    . pantoprazole (PROTONIX) 40 MG tablet Take 1  tablet (40 mg total) by mouth daily at 12 noon. 30 tablet 0  . PARoxetine (PAXIL) 20 MG tablet Take 20 mg by mouth daily as needed (restlessness).     . simvastatin (ZOCOR) 40 MG tablet Take 40 mg by mouth daily.    Marland Kitchen zolpidem (AMBIEN) 5 MG tablet Take 1 tablet (5 mg total) by mouth at bedtime as needed for sleep. 30 tablet 0   No facility-administered medications prior to visit.         Objective:   Physical Exam Filed Vitals:   11/13/14 1351  BP: 126/62  Pulse: 58  Temp: 97.1 F (36.2 C)  TempSrc: Oral  Height: 5\' 5"  (1.651 m)  Weight: 276 lb (125.193 kg)  SpO2: 95%   Gen: Pleasant, well-nourished, in no distress,  normal affect  ENT: No lesions,  mouth clear,  oropharynx clear, no postnasal drip  Neck: No JVD, no TMG, no carotid bruits  Lungs: No use of accessory muscles, no dullness to percussion, clear without rales or rhonchi  Cardiovascular: RRR, heart sounds normal, no murmur or gallops, no peripheral edema  Abdomen: soft and NT, no HSM,  BS normal  Musculoskeletal: No deformities, no cyanosis or clubbing  Neuro: alert, non focal  Skin: Warm, no lesions or rashes      PET scan 11/05/14 --  COMPARISON: CT abdomen pelvis 10/25/2014 and CT chest 10/23/2014.  FINDINGS: NECK  There is a hypermetabolic 1.7 cm left level 2 lymph node, with an SUV max of 13.6. No additional hypermetabolic lymph nodes in the neck. CT images show scattered areas of predominantly white matter edema in the frontal and parietal lobes bilaterally.  CHEST  Hypermetabolic mediastinal and left hilar lymph nodes are seen. Index subcarinal lymph node measures 2.3 cm with an SUV max of 10.5. An 11 mm nodule in the medial left lower lobe has an SUV max of 3.1. CT images show no acute findings. Three-vessel coronary artery calcification. Heart is enlarged. No pericardial or pleural effusion.  ABDOMEN/PELVIS  No abnormal hypermetabolism in the liver, adrenal glands, spleen  or pancreas. There is focal hypermetabolism associated with suspected eccentric rectal wall thickening, measuring approximately 2.4 x 2.7 cm (CT image 195), with an SUV max of 13.1. No adjacent adenopathy. No hypermetabolic lymph nodes. CT images show the liver, gallbladder, adrenal glands, kidneys, spleen, pancreas, stomach and bowel is otherwise grossly unremarkable. No free fluid.  SKELETON  No abnormal osseous hypermetabolism. Degenerative changes are seen in the spine.  IMPRESSION: 1. Hypermetabolic mediastinal, left hilar and left neck adenopathy with a mildly hypermetabolic left lower lobe nodule. 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 2. Possible rectal mass with associated hypermetabolism. 3. Three-vessel coronary artery calcification     Assessment & Plan:  Metastatic  adenocarcinoma to brain with unknown primary site Her needle biopsy shows adenocarcinoma of lung primary but given her rectal mass on PET scan is still not entirely clear to me whether her brain lesions come from the lung or from the GI tract. She is already undergoing whole brain radiation. I will refer her to gastroenterology to see if she needs a colonoscopy to clarify whether there is a colon cancer present. If Dr. Alvy Bimler  with oncology does not feel that this will change management and we can always cancel his appointment   COPD (chronic obstructive pulmonary disease) I will follow with her after her cancer evaluation and treatment is proceeding. We will perform  function testing at that time

## 2014-11-13 NOTE — Progress Notes (Signed)
  Radiation Oncology         [phone] ________________________________  Name: Caitlin Carter MRN: [id number]  Date: 11/13/2014  DOB: 02/07/1946  Simulation Verification Note    ICD-9-CM ICD-10-CM   1. 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 The clinical setup was verified. Then port films were obtained and uploaded to the radiation oncology medical record software.  The treatment beams were carefully compared against the planned radiation fields. The position location and shape of the radiation fields was reviewed. They targeted volume of tissue appears to be appropriately covered by the radiation beams. Organs at risk appear to be excluded as planned.  Based on my personal review, I approved the simulation verification. The patient's treatment will proceed as planned.  -----------------------------------  Blair Promise, PhD, MD

## 2014-11-14 ENCOUNTER — Ambulatory Visit
Admission: RE | Admit: 2014-11-14 | Discharge: 2014-11-14 | Disposition: A | Discharge status: Home / Self Care | Payer: Medicare Other | Source: Ambulatory Visit | Attending: Radiation Oncology | Admitting: Radiation Oncology

## 2014-11-14 DIAGNOSIS — C801 Malignant (primary) neoplasm, unspecified: Principal | ICD-10-CM

## 2014-11-14 DIAGNOSIS — Z510 Encounter for antineoplastic radiation therapy: Secondary | ICD-10-CM | POA: Diagnosis not present

## 2014-11-14 DIAGNOSIS — C7931 Secondary malignant neoplasm of brain: Secondary | ICD-10-CM

## 2014-11-14 MED ORDER — BIAFINE EX EMUL
Freq: Two times a day (BID) | CUTANEOUS | Status: DC
  Administered 2014-11-14: 14:00:00 via TOPICAL

## 2014-11-14 NOTE — Progress Notes (Signed)
Completed post sim education with patient and her family member. Oriented patient to staff and routine of the clinic. Provided patient with RADIATION THERAPY AND YOU handbook then, reviewed pertinent information. Educated patient reference potential side effects and management such as fatigue, hair loss, headache, and nausea. Provided patient with Biafine cream and directed upon use. Allow patient opportunity to ask questions and answered those to the best of my ability. Patient verbalized understanding of all reviewed.

## 2014-11-17 ENCOUNTER — Ambulatory Visit
Admission: RE | Admit: 2014-11-17 | Discharge: 2014-11-17 | Disposition: A | Discharge status: Home / Self Care | Payer: Medicare Other | Source: Ambulatory Visit | Attending: Radiation Oncology | Admitting: Radiation Oncology

## 2014-11-17 DIAGNOSIS — Z510 Encounter for antineoplastic radiation therapy: Secondary | ICD-10-CM | POA: Diagnosis not present

## 2014-11-18 ENCOUNTER — Telehealth: Payer: Self-pay | Admitting: Hematology and Oncology

## 2014-11-18 ENCOUNTER — Ambulatory Visit (HOSPITAL_BASED_OUTPATIENT_CLINIC_OR_DEPARTMENT_OTHER): Payer: Medicare Other | Admitting: Hematology and Oncology

## 2014-11-18 ENCOUNTER — Encounter: Payer: Self-pay | Admitting: Radiation Oncology

## 2014-11-18 ENCOUNTER — Ambulatory Visit: Payer: Medicare Other

## 2014-11-18 ENCOUNTER — Ambulatory Visit
Admission: RE | Admit: 2014-11-18 | Discharge: 2014-11-18 | Disposition: A | Discharge status: Home / Self Care | Payer: Medicare Other | Source: Ambulatory Visit | Attending: Radiation Oncology | Admitting: Radiation Oncology

## 2014-11-18 ENCOUNTER — Encounter: Payer: Self-pay | Admitting: Hematology and Oncology

## 2014-11-18 VITALS — BP 139/79 | HR 70 | Temp 98.1°F | Resp 22 | Ht 65.0 in | Wt 280.3 lb

## 2014-11-18 VITALS — BP 115/49 | HR 75 | Temp 98.4°F | Resp 20 | Wt 280.7 lb

## 2014-11-18 DIAGNOSIS — C7931 Secondary malignant neoplasm of brain: Secondary | ICD-10-CM

## 2014-11-18 DIAGNOSIS — Z510 Encounter for antineoplastic radiation therapy: Secondary | ICD-10-CM | POA: Diagnosis not present

## 2014-11-18 DIAGNOSIS — C3490 Malignant neoplasm of unspecified part of unspecified bronchus or lung: Secondary | ICD-10-CM

## 2014-11-18 DIAGNOSIS — C801 Malignant (primary) neoplasm, unspecified: Principal | ICD-10-CM

## 2014-11-18 DIAGNOSIS — J449 Chronic obstructive pulmonary disease, unspecified: Secondary | ICD-10-CM

## 2014-11-18 DIAGNOSIS — R29898 Other symptoms and signs involving the musculoskeletal system: Secondary | ICD-10-CM

## 2014-11-18 NOTE — Progress Notes (Signed)
Checked in new pt with no financial concerns at this time.  Pt has Raquel's card for any billing questions or concerns.

## 2014-11-18 NOTE — Telephone Encounter (Signed)
gv and printed appt sched and avs for pt for March and April...sed added tx. °

## 2014-11-18 NOTE — Progress Notes (Addendum)
Weekly rad txs brain 4 completed, no nausea, no head aches, , vision changes, does get light headed when first gets up, appetites good, c/o legs hurting,  Some fatigue ,gets better once she starts going stated, not using biafine yet but head has started to get itchy, will start using biafine cream today, has run out of decadron, needs refill ,is taking keppra 2:04 PM

## 2014-11-18 NOTE — Progress Notes (Signed)
  Radiation Oncology         [phone] ________________________________  Name: Caitlin Carter MRN: [id number]  Date: 11/18/2014  DOB: 11-17-1945  Weekly Radiation Therapy Management  Diagnosis: Stage IV adenocarcinoma of the lung  Current Dose: 10 Gy     Planned Dose:  35 Gy  Narrative . . . . . . . . The patient presents for routine under treatment assessment.                                   The patient is without complaint.  She denies any nausea or headaches this time. The biopsy results taken from the left submandibular region returned adenocarcinoma most consistent with lung primary.                                 Set-up films were reviewed.                                 The chart was checked. Physical Findings. . .  weight is 280 lb 11.2 oz (127.325 kg). Her oral temperature is 98.4 F (36.9 C). Her blood pressure is 115/49 and her pulse is 75. Her respiration is 20 and oxygen saturation is 97%. . The oral cavity is moist without secondary infection.  Impression . . . . . . . The patient is tolerating radiation. Plan . . . . . . . . . . . . Continue treatment as planned. Decadron prescription to be refilled  ________________________________   Blair Promise, PhD, MD

## 2014-11-19 ENCOUNTER — Ambulatory Visit
Admission: RE | Admit: 2014-11-19 | Discharge: 2014-11-19 | Disposition: A | Discharge status: Home / Self Care | Payer: Medicare Other | Source: Ambulatory Visit | Attending: Radiation Oncology | Admitting: Radiation Oncology

## 2014-11-19 DIAGNOSIS — J449 Chronic obstructive pulmonary disease, unspecified: Secondary | ICD-10-CM | POA: Insufficient documentation

## 2014-11-19 DIAGNOSIS — Z510 Encounter for antineoplastic radiation therapy: Secondary | ICD-10-CM | POA: Diagnosis not present

## 2014-11-19 NOTE — Assessment & Plan Note (Signed)
She has occasional twitching of her left arm. She will continue Keppra for seizure prophylaxis. I recommend she taper her dexamethasone to 3 times a day as the patient is starting to look cushingoid.

## 2014-11-19 NOTE — Assessment & Plan Note (Signed)
She has chronic shortness of breath from COPD. He has improved. The patient had quit smoking. She will continue to use inhaler as needed.

## 2014-11-19 NOTE — Assessment & Plan Note (Signed)
We reviewed the imaging study and plan of care. The patient will complete radiation treatment first. I will send her tissue biopsy for Foundation 1 testing for additional mutation study. Depending on the outcome of the result, that will help to select treatment options. She will need port placement. I plan to see her back in 2 weeks at the end of her radiation treatment for further discussion about the plan for systemic treatment.

## 2014-11-19 NOTE — Progress Notes (Signed)
[place] OFFICE PROGRESS NOTE  Patient Care Team: Antony Blackbird, MD as PCP - General (Family Medicine)  SUMMARY OF ONCOLOGIC HISTORY:   Metastatic adenocarcinoma to brain   10/23/2014 - 10/30/2014 Hospital Admission She was admitted to the hospital for seizure and was found to have diffuse metastatic cancer to the brain, suspect lung primary   10/24/2014 Imaging MRI of the brain showed multiple metastatic lesions   10/25/2014 Imaging CT scan of the chest, abdomen and pelvis confirm lesion in the chest but no rectal mass.   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   She was admitted on 10/23/14 with COPD and Congestive heart failure exacerbation. On admission, she had complained of intermittent bilateral arm weakness for 2 weeks. CT head without contrast on 2/11 showed patchy vasogenic edema, usually seen with multi focal metastatic disease or infection. There were also multi focal cortical edema suspicious for superimposed infarcts. MRI brain with and without contrast on 2/12 confirmed At least 6 infratentorial and greater than 20 supratentorial metastasis with extensive vasogenic edema, up to 14 mm size, resulting in 2 mm RIGHT to LEFT midline shift. She was placed on IV decadron, keppra. Lumbar puncture was negative for infection.   CT angio chest on 2/12 was negative for pulmonary embolus or malignant lesions. Some lymphadenopathy is seen but no primary source of malignancy was noted. CT of the abdomen or pelvis on 2/13 were negative for acute malignant findings. Hepatic steatosis was noted.   INTERVAL HISTORY: Please see below for  problem oriented charting. She is doing well on radiation treatment. She continues to have persistent shortness of breath but improved since her recent discharge from hospital. She has intermittent twitching of her left arm but no reported seizure activity.  REVIEW OF SYSTEMS:   Constitutional: Denies fevers, chills or abnormal weight loss Eyes: Denies blurriness of vision Ears, nose, mouth, throat, and face: Denies mucositis or sore throat Respiratory: Denies cough, dyspnea or wheezes Cardiovascular: Denies palpitation, chest discomfort or lower extremity swelling Gastrointestinal:  Denies nausea, heartburn or change in bowel habits Skin: Denies abnormal skin rashes Lymphatics: Denies new lymphadenopathy or easy bruising Behavioral/Psych: Mood is stable, no new changes  All other systems were reviewed with the patient and are negative.  I have reviewed the past medical history, past surgical history, social history and family history with the patient and they are unchanged from previous note.  ALLERGIES:  has No Known Allergies.  MEDICATIONS:  Current Outpatient Prescriptions  Medication Sig Dispense Refill  . albuterol (PROVENTIL HFA;VENTOLIN HFA) 108 (90 BASE) MCG/ACT inhaler Inhale 1-2 puffs into the lungs every 4 (four) hours as needed for wheezing or shortness of breath. 2 Inhaler 0  . aspirin 81 MG tablet Take 81 mg by mouth daily.    . Black Cohosh 160 MG CAPS Take 160 mg by mouth daily.    . calcium-vitamin D (OSCAL WITH D) 500-200 MG-UNIT per tablet Take 1 tablet by mouth daily with breakfast.    . dexamethasone (DECADRON) 4 MG tablet Take 1 tablet (4 mg total) by mouth 4 (four) times daily. 60 tablet 0  . emollient (BIAFINE) cream Apply topically as needed.    Marland Kitchen  Garlic 10 MG CAPS Take 10 mg elemental calcium/kg/hr by mouth daily.    Marland Kitchen glucosamine-chondroitin 500-400 MG tablet Take 2 tablets by mouth daily.    Marland Kitchen levETIRAcetam (KEPPRA) 500 MG tablet Take 1 tablet (500 mg total)  by mouth 2 (two) times daily. 60 tablet 0  . lisinopril-hydrochlorothiazide (PRINZIDE,ZESTORETIC) 20-25 MG per tablet Take 1 tablet by mouth daily. 30 tablet 6  . Melatonin 10 MG CAPS Take 10 mg by mouth at bedtime as needed (sleep).     . Multiple Vitamin (MULTIVITAMIN) tablet Take 1 tablet by mouth daily. Silver centrum    . Omega-3 Fatty Acids (FISH OIL) 1000 MG CAPS Take 1,000 mg by mouth daily.    Marland Kitchen PARoxetine (PAXIL) 20 MG tablet Take 20 mg by mouth daily as needed (restlessness).     . simvastatin (ZOCOR) 40 MG tablet Take 40 mg by mouth daily.    . nicotine (NICODERM CQ - DOSED IN MG/24 HOURS) 21 mg/24hr patch Place 1 patch (21 mg total) onto the skin daily. (Patient not taking: Reported on 11/18/2014) 28 patch 0  . pantoprazole (PROTONIX) 40 MG tablet Take 1 tablet (40 mg total) by mouth daily at 12 noon. (Patient not taking: Reported on 11/18/2014) 30 tablet 0   No current facility-administered medications for this visit.    PHYSICAL EXAMINATION: ECOG PERFORMANCE STATUS: 1 - Symptomatic but completely ambulatory  Filed Vitals:   11/18/14 1441  BP: 139/79  Pulse: 70  Temp: 98.1 F (36.7 C)  Resp: 22   Filed Weights   11/18/14 1441  Weight: 280 lb 4.8 oz (127.143 kg)    GENERAL:alert, no distress and comfortable. She is morbidly obese and appeared cushingoid SKIN: skin color, texture, turgor are normal, no rashes or significant lesions EYES: normal, Conjunctiva are pink and non-injected, sclera clear OROPHARYNX:no exudate, no erythema and lips, buccal mucosa, and tongue normal  NECK: supple, thyroid normal size, non-tender, without nodularity LYMPH:  no palpable lymphadenopathy in the cervical, axillary or inguinal LUNGS: clear to auscultation and percussion with normal breathing effort HEART: regular rate & rhythm and no murmurs and no lower extremity edema ABDOMEN:abdomen soft, non-tender and normal bowel sounds Musculoskeletal:no cyanosis of digits and no clubbing   NEURO: alert & oriented x 3 with fluent speech, no focal motor/sensory deficits  LABORATORY DATA:  I have reviewed the data as listed    Component Value Date/Time   NA 138 10/30/2014 0330   K 4.9 10/30/2014 0330   CL 106 10/30/2014 0330   CO2 23 10/30/2014 0330   GLUCOSE 102* 10/30/2014 0330   BUN 30* 10/30/2014 0330   CREATININE 0.84 10/30/2014 0330   CALCIUM 9.1 10/30/2014 0330   PROT 6.9 10/28/2014 2248   ALBUMIN 3.7 10/28/2014 2248   AST 27 10/28/2014 2248   ALT 47* 10/28/2014 2248   ALKPHOS 66 10/28/2014 2248   BILITOT 0.5 10/28/2014 2248   GFRNONAA 70* 10/30/2014 0330   GFRAA 81* 10/30/2014 0330    No results found for: SPEP, UPEP  Lab Results  Component Value Date   WBC 12.2* 11/10/2014   NEUTROABS 10.7* 10/28/2014   HGB 16.3* 11/10/2014   HCT 49.1* 11/10/2014   MCV 89.6 11/10/2014   PLT 174 11/10/2014      Chemistry      Component Value Date/Time   NA 138 10/30/2014 0330   K 4.9 10/30/2014 0330   CL 106 10/30/2014 0330   CO2 23 10/30/2014 0330   BUN 30* 10/30/2014 0330  CREATININE 0.84 10/30/2014 0330      Component Value Date/Time   CALCIUM 9.1 10/30/2014 0330   ALKPHOS 66 10/28/2014 2248   AST 27 10/28/2014 2248   ALT 47* 10/28/2014 2248   BILITOT 0.5 10/28/2014 2248       RADIOGRAPHIC STUDIES: I reviewed the imaging study with her and her brother I have personally reviewed the radiological images as listed and agreed with the findings in the report.  ASSESSMENT & PLAN:  Metastatic adenocarcinoma to brain We reviewed the imaging study and plan of care. The patient will complete radiation treatment first. I will send her tissue biopsy for Foundation 1 testing for additional mutation study. Depending on the outcome of the result, that will help to select treatment options. She will need port placement. I plan to see her back in 2 weeks at the end of her radiation treatment for further discussion about the plan for systemic  treatment.   COPD (chronic obstructive pulmonary disease) She has chronic shortness of breath from COPD. He has improved. The patient had quit smoking. She will continue to use inhaler as needed.   Left arm weakness She has occasional twitching of her left arm. She will continue Keppra for seizure prophylaxis. I recommend she taper her dexamethasone to 3 times a day as the patient is starting to look cushingoid.    Orders Placed This Encounter  Procedures  . IR Fluoro Guide CV Line Right    Indicate type of CVC ordering    Standing Status: Future     Number of Occurrences:      Standing Expiration Date: 01/18/2016    Order Specific Question:  Reason for exam:    Answer:  need port for chemo, after XRT is finished    Order Specific Question:  Preferred Imaging Location?    Answer:  [facility]   All questions were answered. The patient knows to call the clinic with any problems, questions or concerns. No barriers to learning was detected. I spent 30 minutes counseling the patient face to face. The total time spent in the appointment was 40 minutes and more than 50% was on counseling and review of test results     [facility] Jorgia Manthei, MD 11/19/2014 7:45 AM

## 2014-11-20 ENCOUNTER — Ambulatory Visit
Admission: RE | Admit: 2014-11-20 | Discharge: 2014-11-20 | Disposition: A | Discharge status: Home / Self Care | Payer: Medicare Other | Source: Ambulatory Visit | Attending: Radiation Oncology | Admitting: Radiation Oncology

## 2014-11-20 ENCOUNTER — Telehealth: Payer: Self-pay | Admitting: *Deleted

## 2014-11-20 ENCOUNTER — Telehealth: Payer: Self-pay | Admitting: Oncology

## 2014-11-20 DIAGNOSIS — Z510 Encounter for antineoplastic radiation therapy: Secondary | ICD-10-CM | POA: Diagnosis not present

## 2014-11-20 NOTE — Telephone Encounter (Signed)
She has CT pelvis in the hospital which looked ok but it's not a bad idea to get it checked out just in case

## 2014-11-20 NOTE — Telephone Encounter (Signed)
Per Rise Paganini, she is taking 2 mg of decadron three times a day.  She said she received 2 mg tablets from the pharmacy ([facility]) and was supposed to take 2 tablets to make 4 mg.  She said that Dr. Sondra Come recommended that she reduce the dose so she has only been taking 1 (2 mg) tablet instead of 2.  She said she has about 4 days worth of tablets left.  Dr. Sondra Come notified about what patient has been taking.  Per Dr. Sondra Come, patient to continue taking 2 mg TID until radiation is finished and then taper.  Refill called in per Dr. Sondra Come to [facility] for dexamethasone (Decadron) 2 mg tablet - take 1 tablet (2 mg total) by mouth 3 times daily.  Disp. 40 tablets. 0 refills.

## 2014-11-20 NOTE — Telephone Encounter (Signed)
DR.BYRUM SAID THERE WAS SOMETHING IRREGULAR IN THE RECTUM ON PT.'S PET SCAN. HE MADE PT. AN APPOINTMENT WITH DR.GANEM ON 11/21/14 AT 2:30PM. PT. WANTED DR.GORSUCH TO KNOW AND THEN ASKED IF THIS IS NECESSARY? THIS NOTE WAS SENT TO [facility] AND [facility].

## 2014-11-20 NOTE — Telephone Encounter (Signed)
NOTIFIED PT. OF DR.GORSUCH'S RESPONSE. PT. WILL KEEP APPOINTMENT WITH DR.GANEM.

## 2014-11-21 ENCOUNTER — Ambulatory Visit
Admission: RE | Admit: 2014-11-21 | Discharge: 2014-11-21 | Disposition: A | Discharge status: Home / Self Care | Payer: Medicare Other | Source: Ambulatory Visit | Attending: Radiation Oncology | Admitting: Radiation Oncology

## 2014-11-21 ENCOUNTER — Encounter (HOSPITAL_COMMUNITY): Payer: Self-pay

## 2014-11-21 DIAGNOSIS — Z510 Encounter for antineoplastic radiation therapy: Secondary | ICD-10-CM | POA: Diagnosis not present

## 2014-11-24 ENCOUNTER — Ambulatory Visit
Admission: RE | Admit: 2014-11-24 | Discharge: 2014-11-24 | Disposition: A | Discharge status: Home / Self Care | Payer: Medicare Other | Source: Ambulatory Visit | Attending: Radiation Oncology | Admitting: Radiation Oncology

## 2014-11-24 DIAGNOSIS — Z510 Encounter for antineoplastic radiation therapy: Secondary | ICD-10-CM | POA: Diagnosis not present

## 2014-11-25 ENCOUNTER — Ambulatory Visit
Admission: RE | Admit: 2014-11-25 | Discharge: 2014-11-25 | Disposition: A | Discharge status: Home / Self Care | Payer: Medicare Other | Source: Ambulatory Visit | Attending: Radiation Oncology | Admitting: Radiation Oncology

## 2014-11-25 ENCOUNTER — Other Ambulatory Visit: Payer: Medicare Other

## 2014-11-25 ENCOUNTER — Encounter: Payer: Self-pay | Admitting: *Deleted

## 2014-11-25 VITALS — BP 124/75 | HR 86 | Resp 16 | Wt 285.9 lb

## 2014-11-25 DIAGNOSIS — Z510 Encounter for antineoplastic radiation therapy: Secondary | ICD-10-CM | POA: Diagnosis not present

## 2014-11-25 DIAGNOSIS — C7931 Secondary malignant neoplasm of brain: Secondary | ICD-10-CM

## 2014-11-25 NOTE — Progress Notes (Signed)
Reports tapering down on decadron from 4 mg qid to tid. Reports occasional headache that resolve quickly without medication. Denies nausea, vomiting, dizziness, diplopia or ringing in the ears. Alopecia reported. Reports using baby shampoo as directed. Encouraged patient to use Biafine cream on forehead bid for faint hyperpigmentation and dryness. Reports her right femur from her hip to her right knee cap hurts. Reports she fell several weeks ago on this knee. Reports eating and sleeping without difficulty. Reports that she tires easily.

## 2014-11-25 NOTE — Progress Notes (Signed)
Weekly Management Note Current Dose: 22.5 Gy  Projected Dose: 35 Gy   Narrative:  The patient presents for routine under treatment assessment.  CBCT/MVCT images/Port film x-rays were reviewed.  The chart was checked. No headaches or weakness. Good appetite. Has appt with Alvy Bimler next week and PAC. Tolerating decadron tid well since Thursday. Accompanied by a friend.   Physical Findings: Weight: 285 lb 14.4 oz (129.683 kg). No thrush. Alert and oriented.   Impression:  The patient is tolerating radiation.  Plan:  Continue treatment as planned. Continue decadron taper to 4 mg bid. Follow up with [facility] next Tuesday.

## 2014-11-26 ENCOUNTER — Other Ambulatory Visit: Payer: Self-pay | Admitting: Hematology and Oncology

## 2014-11-26 ENCOUNTER — Telehealth: Payer: Self-pay | Admitting: Hematology and Oncology

## 2014-11-26 ENCOUNTER — Ambulatory Visit
Admission: RE | Admit: 2014-11-26 | Discharge: 2014-11-26 | Disposition: A | Discharge status: Home / Self Care | Payer: Medicare Other | Source: Ambulatory Visit | Attending: Radiation Oncology | Admitting: Radiation Oncology

## 2014-11-26 DIAGNOSIS — C7931 Secondary malignant neoplasm of brain: Secondary | ICD-10-CM

## 2014-11-26 DIAGNOSIS — Z510 Encounter for antineoplastic radiation therapy: Secondary | ICD-10-CM | POA: Diagnosis not present

## 2014-11-26 NOTE — Telephone Encounter (Signed)
I spoke with the patient. Pathology sample was not sufficient for Foundation 1 testing. I recommended [facility] 360 testing from blood and she agreed to proceed

## 2014-11-26 NOTE — Telephone Encounter (Signed)
err

## 2014-11-27 ENCOUNTER — Other Ambulatory Visit: Payer: Self-pay | Admitting: Hematology and Oncology

## 2014-11-27 ENCOUNTER — Telehealth: Payer: Self-pay | Admitting: Hematology and Oncology

## 2014-11-27 ENCOUNTER — Ambulatory Visit (HOSPITAL_BASED_OUTPATIENT_CLINIC_OR_DEPARTMENT_OTHER): Payer: Medicare Other

## 2014-11-27 ENCOUNTER — Ambulatory Visit
Admission: RE | Admit: 2014-11-27 | Discharge: 2014-11-27 | Disposition: A | Discharge status: Home / Self Care | Payer: Medicare Other | Source: Ambulatory Visit | Attending: Radiation Oncology | Admitting: Radiation Oncology

## 2014-11-27 DIAGNOSIS — C7931 Secondary malignant neoplasm of brain: Secondary | ICD-10-CM

## 2014-11-27 DIAGNOSIS — C3490 Malignant neoplasm of unspecified part of unspecified bronchus or lung: Secondary | ICD-10-CM

## 2014-11-27 DIAGNOSIS — Z510 Encounter for antineoplastic radiation therapy: Secondary | ICD-10-CM | POA: Diagnosis not present

## 2014-11-27 LAB — CBC WITH DIFFERENTIAL/PLATELET
BASO%: 0.2 % (ref 0.0–2.0)
BASOS ABS: 0 10*3/uL (ref 0.0–0.1)
EOS%: 0.6 % (ref 0.0–7.0)
Eosinophils Absolute: 0 10*3/uL (ref 0.0–0.5)
HEMATOCRIT: 44.5 % (ref 34.8–46.6)
HGB: 14.3 g/dL (ref 11.6–15.9)
LYMPH%: 21.1 % (ref 14.0–49.7)
MCH: 29.4 pg (ref 25.1–34.0)
MCHC: 32.1 g/dL (ref 31.5–36.0)
MCV: 91.4 fL (ref 79.5–101.0)
MONO#: 0.6 10*3/uL (ref 0.1–0.9)
MONO%: 10.1 % (ref 0.0–14.0)
NEUT#: 3.7 10*3/uL (ref 1.5–6.5)
NEUT%: 68 % (ref 38.4–76.8)
PLATELETS: 182 10*3/uL (ref 145–400)
RBC: 4.87 10*6/uL (ref 3.70–5.45)
RDW: 15.7 % — AB (ref 11.2–14.5)
WBC: 5.4 10*3/uL (ref 3.9–10.3)
lymph#: 1.2 10*3/uL (ref 0.9–3.3)

## 2014-11-27 LAB — COMPREHENSIVE METABOLIC PANEL (CC13)
ALK PHOS: 68 U/L (ref 40–150)
ALT: 54 U/L (ref 0–55)
AST: 24 U/L (ref 5–34)
Albumin: 3.1 g/dL — ABNORMAL LOW (ref 3.5–5.0)
Anion Gap: 11 mEq/L (ref 3–11)
BUN: 27.5 mg/dL — ABNORMAL HIGH (ref 7.0–26.0)
CHLORIDE: 111 meq/L — AB (ref 98–109)
CO2: 23 meq/L (ref 22–29)
CREATININE: 0.8 mg/dL (ref 0.6–1.1)
Calcium: 9.3 mg/dL (ref 8.4–10.4)
EGFR: 78 mL/min/{1.73_m2} — ABNORMAL LOW (ref >90)
Glucose: 66 mg/dl — ABNORMAL LOW (ref 70–140)
Potassium: 4.7 mEq/L (ref 3.5–5.1)
SODIUM: 144 meq/L (ref 136–145)
TOTAL PROTEIN: 6.5 g/dL (ref 6.4–8.3)
Total Bilirubin: 0.34 mg/dL (ref 0.20–1.20)

## 2014-11-27 NOTE — Telephone Encounter (Signed)
spoke with pt and advised on added appts....patient ok and aware

## 2014-11-28 ENCOUNTER — Ambulatory Visit
Admission: RE | Admit: 2014-11-28 | Discharge: 2014-11-28 | Disposition: A | Discharge status: Home / Self Care | Payer: Medicare Other | Source: Ambulatory Visit | Attending: Radiation Oncology | Admitting: Radiation Oncology

## 2014-11-28 DIAGNOSIS — Z510 Encounter for antineoplastic radiation therapy: Secondary | ICD-10-CM | POA: Diagnosis not present

## 2014-12-01 ENCOUNTER — Other Ambulatory Visit: Payer: Self-pay | Admitting: *Deleted

## 2014-12-01 ENCOUNTER — Ambulatory Visit
Admission: RE | Admit: 2014-12-01 | Discharge: 2014-12-01 | Disposition: A | Discharge status: Home / Self Care | Payer: Medicare Other | Source: Ambulatory Visit | Attending: Radiation Oncology | Admitting: Radiation Oncology

## 2014-12-01 DIAGNOSIS — Z510 Encounter for antineoplastic radiation therapy: Secondary | ICD-10-CM | POA: Diagnosis not present

## 2014-12-01 MED ORDER — LEVETIRACETAM 500 MG PO TABS
500.0000 mg | ORAL_TABLET | Freq: Two times a day (BID) | ORAL | Status: DC
Start: 2014-12-01 — End: 2014-12-10

## 2014-12-02 ENCOUNTER — Ambulatory Visit
Admission: RE | Admit: 2014-12-02 | Discharge: 2014-12-02 | Disposition: A | Discharge status: Home / Self Care | Payer: Medicare Other | Source: Ambulatory Visit | Attending: Radiation Oncology | Admitting: Radiation Oncology

## 2014-12-02 ENCOUNTER — Encounter: Payer: Self-pay | Admitting: Hematology and Oncology

## 2014-12-02 ENCOUNTER — Telehealth: Payer: Self-pay | Admitting: Oncology

## 2014-12-02 ENCOUNTER — Encounter: Payer: Self-pay | Admitting: Radiation Oncology

## 2014-12-02 ENCOUNTER — Telehealth: Payer: Self-pay | Admitting: Hematology and Oncology

## 2014-12-02 ENCOUNTER — Ambulatory Visit (HOSPITAL_BASED_OUTPATIENT_CLINIC_OR_DEPARTMENT_OTHER): Payer: Medicare Other | Admitting: Hematology and Oncology

## 2014-12-02 VITALS — BP 97/52 | HR 62 | Temp 98.5°F | Resp 20 | Ht 65.0 in | Wt 281.9 lb

## 2014-12-02 VITALS — BP 137/52 | HR 60 | Temp 98.3°F | Resp 17 | Ht 65.0 in | Wt 281.6 lb

## 2014-12-02 DIAGNOSIS — C3490 Malignant neoplasm of unspecified part of unspecified bronchus or lung: Secondary | ICD-10-CM

## 2014-12-02 DIAGNOSIS — C7931 Secondary malignant neoplasm of brain: Secondary | ICD-10-CM | POA: Diagnosis not present

## 2014-12-02 DIAGNOSIS — J438 Other emphysema: Secondary | ICD-10-CM

## 2014-12-02 DIAGNOSIS — Z510 Encounter for antineoplastic radiation therapy: Secondary | ICD-10-CM | POA: Diagnosis not present

## 2014-12-02 MED ORDER — ONDANSETRON HCL 8 MG PO TABS: 8.0000 mg | ORAL_TABLET | Freq: Three times a day (TID) | ORAL | Status: DC | PRN

## 2014-12-02 MED ORDER — LIDOCAINE-PRILOCAINE 2.5-2.5 % EX CREA: TOPICAL_CREAM | CUTANEOUS | Status: DC

## 2014-12-02 MED ORDER — FOLIC ACID 1 MG PO TABS: 1.0000 mg | ORAL_TABLET | Freq: Every day | ORAL | Status: DC

## 2014-12-02 MED ORDER — PROCHLORPERAZINE MALEATE 10 MG PO TABS: 10.0000 mg | ORAL_TABLET | Freq: Four times a day (QID) | ORAL | Status: DC | PRN

## 2014-12-02 NOTE — Patient Instructions (Signed)
Carboplatin injection What is this medicine? CARBOPLATIN (KAR boe pla tin) is a chemotherapy drug. It targets fast dividing cells, like cancer cells, and causes these cells to die. This medicine is used to treat ovarian cancer and many other cancers. This medicine may be used for other purposes; ask your health care provider or pharmacist if you have questions. COMMON BRAND NAME(S): Paraplatin What should I tell my health care provider before I take this medicine? They need to know if you have any of these conditions: -blood disorders -hearing problems -kidney disease -recent or ongoing radiation therapy -an unusual or allergic reaction to carboplatin, cisplatin, other chemotherapy, other medicines, foods, dyes, or preservatives -pregnant or trying to get pregnant -breast-feeding How should I use this medicine? This drug is usually given as an infusion into a vein. It is administered in a hospital or clinic by a specially trained health care professional. Talk to your pediatrician regarding the use of this medicine in children. Special care may be needed. Overdosage: If you think you have taken too much of this medicine contact a poison control center or emergency room at once. NOTE: This medicine is only for you. Do not share this medicine with others. What if I miss a dose? It is important not to miss a dose. Call your doctor or health care professional if you are unable to keep an appointment. What may interact with this medicine? -medicines for seizures -medicines to increase blood counts like filgrastim, pegfilgrastim, sargramostim -some antibiotics like amikacin, gentamicin, neomycin, streptomycin, tobramycin -vaccines Talk to your doctor or health care professional before taking any of these medicines: -acetaminophen -aspirin -ibuprofen -ketoprofen -naproxen This list may not describe all possible interactions. Give your health care provider a list of all the medicines, herbs,  non-prescription drugs, or dietary supplements you use. Also tell them if you smoke, drink alcohol, or use illegal drugs. Some items may interact with your medicine. What should I watch for while using this medicine? Your condition will be monitored carefully while you are receiving this medicine. You will need important blood work done while you are taking this medicine. This drug may make you feel generally unwell. This is not uncommon, as chemotherapy can affect healthy cells as well as cancer cells. Report any side effects. Continue your course of treatment even though you feel ill unless your doctor tells you to stop. In some cases, you may be given additional medicines to help with side effects. Follow all directions for their use. Call your doctor or health care professional for advice if you get a fever, chills or sore throat, or other symptoms of a cold or flu. Do not treat yourself. This drug decreases your body's ability to fight infections. Try to avoid being around people who are sick. This medicine may increase your risk to bruise or bleed. Call your doctor or health care professional if you notice any unusual bleeding. Be careful brushing and flossing your teeth or using a toothpick because you may get an infection or bleed more easily. If you have any dental work done, tell your dentist you are receiving this medicine. Avoid taking products that contain aspirin, acetaminophen, ibuprofen, naproxen, or ketoprofen unless instructed by your doctor. These medicines may hide a fever. Do not become pregnant while taking this medicine. Women should inform their doctor if they wish to become pregnant or think they might be pregnant. There is a potential for serious side effects to an unborn child. Talk to your health care professional or  pharmacist for more information. Do not breast-feed an infant while taking this medicine. What side effects may I notice from receiving this medicine? Side effects  that you should report to your doctor or health care professional as soon as possible: -allergic reactions like skin rash, itching or hives, swelling of the face, lips, or tongue -signs of infection - fever or chills, cough, sore throat, pain or difficulty passing urine -signs of decreased platelets or bleeding - bruising, pinpoint red spots on the skin, black, tarry stools, nosebleeds -signs of decreased red blood cells - unusually weak or tired, fainting spells, lightheadedness -breathing problems -changes in hearing -changes in vision -chest pain -high blood pressure -low blood counts - This drug may decrease the number of white blood cells, red blood cells and platelets. You may be at increased risk for infections and bleeding. -nausea and vomiting -pain, swelling, redness or irritation at the injection site -pain, tingling, numbness in the hands or feet -problems with balance, talking, walking -trouble passing urine or change in the amount of urine Side effects that usually do not require medical attention (report to your doctor or health care professional if they continue or are bothersome): -hair loss -loss of appetite -metallic taste in the mouth or changes in taste This list may not describe all possible side effects. Call your doctor for medical advice about side effects. You may report side effects to FDA at 1-800-FDA-1088. Where should I keep my medicine? This drug is given in a hospital or clinic and will not be stored at home. NOTE: This sheet is a summary. It may not cover all possible information. If you have questions about this medicine, talk to your doctor, pharmacist, or health care provider.  2015, Elsevier/Gold Standard. (2007-12-04 14:38:05) Pemetrexed injection What is this medicine? PEMETREXED (PEM e TREX ed) is a chemotherapy drug. This medicine affects cells that are rapidly growing, such as cancer cells and cells in your mouth and stomach. It is usually used to  treat lung cancers like non-small cell lung cancer and mesothelioma. It may also be used to treat other cancers. This medicine may be used for other purposes; ask your health care provider or pharmacist if you have questions. COMMON BRAND NAME(S): Alimta What should I tell my health care provider before I take this medicine? They need to know if you have any of these conditions: -if you frequently drink alcohol containing beverages -infection (especially a virus infection such as chickenpox, cold sores, or herpes) -kidney disease -liver disease -low blood counts, like low platelets, red bloods, or white blood cells -an unusual or allergic reaction to pemetrexed, mannitol, other medicines, foods, dyes, or preservatives -pregnant or trying to get pregnant -breast-feeding How should I use this medicine? This drug is given as an infusion into a vein. It is administered in a hospital or clinic by a specially trained health care professional. Talk to your pediatrician regarding the use of this medicine in children. Special care may be needed. Overdosage: If you think you have taken too much of this medicine contact a poison control center or emergency room at once. NOTE: This medicine is only for you. Do not share this medicine with others. What if I miss a dose? It is important not to miss your dose. Call your doctor or health care professional if you are unable to keep an appointment. What may interact with this medicine? -aspirin and aspirin-like medicines -medicines to increase blood counts like filgrastim, pegfilgrastim, sargramostim -methotrexate -NSAIDS, medicines for pain  and inflammation, like ibuprofen or naproxen -probenecid -pyrimethamine -vaccines Talk to your doctor or health care professional before taking any of these medicines: -acetaminophen -aspirin -ibuprofen -ketoprofen -naproxen This list may not describe all possible interactions. Give your health care provider a  list of all the medicines, herbs, non-prescription drugs, or dietary supplements you use. Also tell them if you smoke, drink alcohol, or use illegal drugs. Some items may interact with your medicine. What should I watch for while using this medicine? Visit your doctor for checks on your progress. This drug may make you feel generally unwell. This is not uncommon, as chemotherapy can affect healthy cells as well as cancer cells. Report any side effects. Continue your course of treatment even though you feel ill unless your doctor tells you to stop. In some cases, you may be given additional medicines to help with side effects. Follow all directions for their use. Call your doctor or health care professional for advice if you get a fever, chills or sore throat, or other symptoms of a cold or flu. Do not treat yourself. This drug decreases your body's ability to fight infections. Try to avoid being around people who are sick. This medicine may increase your risk to bruise or bleed. Call your doctor or health care professional if you notice any unusual bleeding. Be careful brushing and flossing your teeth or using a toothpick because you may get an infection or bleed more easily. If you have any dental work done, tell your dentist you are receiving this medicine. Avoid taking products that contain aspirin, acetaminophen, ibuprofen, naproxen, or ketoprofen unless instructed by your doctor. These medicines may hide a fever. Call your doctor or health care professional if you get diarrhea or mouth sores. Do not treat yourself. To protect your kidneys, drink water or other fluids as directed while you are taking this medicine. Men and women must use effective birth control while taking this medicine. You may also need to continue using effective birth control for a time after stopping this medicine. Do not become pregnant while taking this medicine. Tell your doctor right away if you think that you or your partner  might be pregnant. There is a potential for serious side effects to an unborn child. Talk to your health care professional or pharmacist for more information. Do not breast-feed an infant while taking this medicine. This medicine may lower sperm counts. What side effects may I notice from receiving this medicine? Side effects that you should report to your doctor or health care professional as soon as possible: -allergic reactions like skin rash, itching or hives, swelling of the face, lips, or tongue -low blood counts - this medicine may decrease the number of white blood cells, red blood cells and platelets. You may be at increased risk for infections and bleeding. -signs of infection - fever or chills, cough, sore throat, pain or difficulty passing urine -signs of decreased platelets or bleeding - bruising, pinpoint red spots on the skin, black, tarry stools, blood in the urine -signs of decreased red blood cells - unusually weak or tired, fainting spells, lightheadedness -breathing problems, like a dry cough -changes in emotions or moods -chest pain -confusion -diarrhea -high blood pressure -mouth or throat sores or ulcers -pain, swelling, warmth in the leg -pain on swallowing -swelling of the ankles, feet, hands -trouble passing urine or change in the amount of urine -vomiting -yellowing of the eyes or skin Side effects that usually do not require medical attention (report to  your doctor or health care professional if they continue or are bothersome): -hair loss -loss of appetite -nausea -stomach upset This list may not describe all possible side effects. Call your doctor for medical advice about side effects. You may report side effects to FDA at 1-800-FDA-1088. Where should I keep my medicine? This drug is given in a hospital or clinic and will not be stored at home. NOTE: This sheet is a summary. It may not cover all possible information. If you have questions about this  medicine, talk to your doctor, pharmacist, or health care provider.  2015, Elsevier/Gold Standard. (2008-04-01 13:24:03)

## 2014-12-02 NOTE — Progress Notes (Addendum)
  Radiation Oncology         [phone] ________________________________  Name: Caitlin Carter MRN: [id number]  Date: 12/02/2014  DOB: Mar 25, 1946  Weekly Radiation Therapy Management  Diagnosis: Stage IV adenocarcinoma of the lung  Current Dose: 35 Gy     Planned Dose:  35 Gy  Narrative . . . . . . . . The patient presents for routine under treatment assessment.                                   The patient is without complaint. She is happy to complete her radiation therapy today. Patient did fall recently when getting out of bed but attributes this to "jumping up to quickly. She fell in her right hip but is able to ambulate without difficulty she denies any significant pain in her right pelvis.                                 Set-up films were reviewed.                                 The chart was checked. Physical Findings. . .  height is 5\' 5"  (1.651 m) and weight is 281 lb 14.4 oz (127.869 kg). Her oral temperature is 98.5 F (36.9 C). Her blood pressure is 97/52 and her pulse is 62. Her respiration is 20 and oxygen saturation is 97%. . The oral cavity is moist without secondary infection. The lungs are clear to auscultation. The heart has a regular rhythm and rate. Motor strength is 5 out of 5 in the proximal and distal muscle groups of the upper lower extremities. Impression . . . . . . . The patient is tolerating radiation. Plan . . . . . . . . . . . . Routine follow-up in one month. The patient and caregiver were given instructions on a Decadron taper. The patient will be starting chemotherapy in the near future.  ________________________________   Blair Promise, PhD, MD

## 2014-12-02 NOTE — Telephone Encounter (Signed)
gave and printed appt sched and avs for pt for March and April....sed added tx.

## 2014-12-02 NOTE — Assessment & Plan Note (Signed)
On further review of her pathology, I decided to change her treatment to carboplatin and Alimta. She will return in 2 days and to get her B-12 injection. I have requested the pathology sample to be sent to [facility]. Unfortunately, insufficient tissue sample was encountered. Instead, last week, I sent a blood work to get tested with Guardant 360. If mutation is detected, she may qualify for targeted therapy. I discussed with the risk, benefit, side effects of carboplatin and Alimta including risk of allergic reaction, pancytopenia, risk of infection etc. and she agreed to proceed. I will see her in 2 weeks for further supportive care.

## 2014-12-02 NOTE — Progress Notes (Signed)
Caitlin Carter has completed treatment to her brain with 14 fractions.  She reports pain in her right thigh at a 5/10.  She reports she fell on that side 6 weeks ago and fell again yesterday.  She reports feeling dizzy and weak yesterday when she fell.  She was getting out of bed.  She had to call the fire department to get her up.  She reports weak, tired and off balance for the past three days.  She denies vision changes and nausea.  She is taking decadron 2 mg bid.  The skin on her her scalp/head is red with a peeling area on the back of her head.  She has not been using biafine.  Advised her to apply it twice a day.  She has been given a 1 month follow up card. Her bp was low today at 97/52.  She is taking lisinopril/hctz daily.  BP 97/52 mmHg  Pulse 62  Temp(Src) 98.5 F (36.9 C) (Oral)  Resp 20  Ht 5\' 5"  (1.651 m)  Wt 281 lb 14.4 oz (127.869 kg)  BMI 46.91 kg/m2  SpO2 97%

## 2014-12-02 NOTE — Progress Notes (Signed)
[place] OFFICE PROGRESS NOTE  Patient Care Team: Antony Blackbird, MD as PCP - General (Family Medicine)  SUMMARY OF ONCOLOGIC HISTORY:   Metastatic adenocarcinoma to brain   10/23/2014 - 10/30/2014 Hospital Admission She was admitted to the hospital for seizure and was found to have diffuse metastatic cancer to the brain, suspect lung primary   10/24/2014 Imaging MRI of the brain showed multiple metastatic lesions   10/25/2014 Imaging CT scan of the chest, abdomen and pelvis confirm lesion in the chest but no rectal mass.   11/05/2014 Imaging Hypermetabolic mediastinal, left hilar and left neck adenopathy with a mildly hypermetabolic left lower lobe nodule as well as rectal activity suspect benign in the rectal area    11/10/2014 Pathology Results Accession: [id number] US biopsy confimed metastatic adenocarcinoma of lung primary   11/14/2014 -  Radiation Therapy She is started on treatment for whole brain radiation.    INTERVAL HISTORY: Please see below for problem oriented charting. She returns for further follow-up. She has completed radiation treatment today. She is on a course of dexamethasone taper. No recent infection. No new weakness.  REVIEW OF SYSTEMS:   Constitutional: Denies fevers, chills or abnormal weight loss Eyes: Denies blurriness of vision Ears, nose, mouth, throat, and face: Denies mucositis or sore throat Respiratory: Denies cough, dyspnea or wheezes Cardiovascular: Denies palpitation, chest discomfort or lower extremity swelling Gastrointestinal:  Denies nausea, heartburn or change in bowel habits Skin: Denies abnormal skin rashes Lymphatics: Denies new lymphadenopathy or easy bruising Neurological:Denies numbness, tingling or new weaknesses Behavioral/Psych: Mood is stable, no new changes  All other systems were reviewed with the patient and are negative.  I have reviewed the past medical history, past surgical history, social history and family history  with the patient and they are unchanged from previous note.  ALLERGIES:  has No Known Allergies.  MEDICATIONS:  Current Outpatient Prescriptions  Medication Sig Dispense Refill  . albuterol (PROVENTIL HFA;VENTOLIN HFA) 108 (90 BASE) MCG/ACT inhaler Inhale 1-2 puffs into the lungs every 4 (four) hours as needed for wheezing or shortness of breath. 2 Inhaler 0  . aspirin 81 MG tablet Take 81 mg by mouth daily.    . Black Cohosh 160 MG CAPS Take 160 mg by mouth daily.    . calcium-vitamin D (OSCAL WITH D) 500-200 MG-UNIT per tablet Take 1 tablet by mouth daily with breakfast.    . dexamethasone (DECADRON) 4 MG tablet Take 1 tablet (4 mg total) by mouth 4 (four) times daily. (Patient taking differently: Take 2 mg by mouth 3 (three) times daily. Per Dr. Sondra Come, dosage decreased to 2 mg three times a day.  Refill called in to [facility] 11/20/14.  Disp. 40 tablets. 0 refills.) 60 tablet 0  . emollient (BIAFINE) cream Apply topically as needed.    . Garlic 10 MG CAPS Take 10 mg elemental calcium/kg/hr by mouth daily.    Marland Kitchen glucosamine-chondroitin 500-400 MG tablet Take 2 tablets by mouth daily.    Marland Kitchen levETIRAcetam (KEPPRA) 500 MG tablet Take 1 tablet (500 mg total) by mouth 2 (two) times daily. 60 tablet 0  . lisinopril-hydrochlorothiazide (PRINZIDE,ZESTORETIC) 20-25 MG per tablet Take 1 tablet by mouth daily. 30 tablet 6  . Melatonin 10 MG CAPS Take 10 mg by mouth at bedtime as needed (sleep).     . Multiple Vitamin (MULTIVITAMIN) tablet Take 1 tablet by mouth daily. Silver centrum    . nicotine (NICODERM CQ - DOSED IN MG/24 HOURS) 21  mg/24hr patch Place 1 patch (21 mg total) onto the skin daily. 28 patch 0  . Omega-3 Fatty Acids (FISH OIL) 1000 MG CAPS Take 1,000 mg by mouth daily.    . pantoprazole (PROTONIX) 40 MG tablet TAKE 1 TABLET BY MOUTH DAILY AT NOON 30 tablet 6  . PARoxetine (PAXIL) 20 MG tablet Take 20 mg by mouth daily as needed (restlessness).     . simvastatin (ZOCOR) 40  MG tablet Take 40 mg by mouth daily.    . folic acid (FOLVITE) 1 MG tablet Take 1 tablet (1 mg total) by mouth daily. Continue until 21 days after Alimta completed. 100 tablet 3  . lidocaine-prilocaine (EMLA) cream Apply to affected area once 30 g 3  . ondansetron (ZOFRAN) 8 MG tablet Take 1 tablet (8 mg total) by mouth every 8 (eight) hours as needed. 30 tablet 1  . prochlorperazine (COMPAZINE) 10 MG tablet Take 1 tablet (10 mg total) by mouth every 6 (six) hours as needed (Nausea or vomiting). 30 tablet 1   No current facility-administered medications for this visit.    PHYSICAL EXAMINATION: ECOG PERFORMANCE STATUS: 2 - Symptomatic, <50% confined to bed  Filed Vitals:   12/02/14 1225  BP: 137/52  Pulse: 60  Temp: 98.3 F (36.8 C)  Resp: 17   Filed Weights   12/02/14 1225  Weight: 281 lb 9.6 oz (127.733 kg)    GENERAL:alert, no distress and comfortable. She is morbidly obese. She is sitting on the wheelchair. She is mildly cushingoid SKIN: skin color, texture, turgor are normal, no rashes or significant lesions EYES: normal, Conjunctiva are pink and non-injected, sclera clear Musculoskeletal:no cyanosis of digits and no clubbing  NEURO: alert & oriented x 3 with fluent speech, no focal motor/sensory deficits. Noted unilateral ptosis on the right eye  LABORATORY DATA:  I have reviewed the data as listed    Component Value Date/Time   NA 144 11/27/2014 1105   NA 138 10/30/2014 0330   K 4.7 11/27/2014 1105   K 4.9 10/30/2014 0330   CL 106 10/30/2014 0330   CO2 23 11/27/2014 1105   CO2 23 10/30/2014 0330   GLUCOSE 66* 11/27/2014 1105   GLUCOSE 102* 10/30/2014 0330   BUN 27.5* 11/27/2014 1105   BUN 30* 10/30/2014 0330   CREATININE 0.8 11/27/2014 1105   CREATININE 0.84 10/30/2014 0330   CALCIUM 9.3 11/27/2014 1105   CALCIUM 9.1 10/30/2014 0330   PROT 6.5 11/27/2014 1105   PROT 6.9 10/28/2014 2248   ALBUMIN 3.1* 11/27/2014 1105   ALBUMIN 3.7 10/28/2014 2248   AST 24  11/27/2014 1105   AST 27 10/28/2014 2248   ALT 54 11/27/2014 1105   ALT 47* 10/28/2014 2248   ALKPHOS 68 11/27/2014 1105   ALKPHOS 66 10/28/2014 2248   BILITOT 0.34 11/27/2014 1105   BILITOT 0.5 10/28/2014 2248   GFRNONAA 70* 10/30/2014 0330   GFRAA 81* 10/30/2014 0330    No results found for: SPEP, UPEP  Lab Results  Component Value Date   WBC 5.4 11/27/2014   NEUTROABS 3.7 11/27/2014   HGB 14.3 11/27/2014   HCT 44.5 11/27/2014   MCV 91.4 11/27/2014   PLT 182 11/27/2014      Chemistry      Component Value Date/Time   NA 144 11/27/2014 1105   NA 138 10/30/2014 0330   K 4.7 11/27/2014 1105   K 4.9 10/30/2014 0330   CL 106 10/30/2014 0330   CO2 23 11/27/2014 1105  CO2 23 10/30/2014 0330   BUN 27.5* 11/27/2014 1105   BUN 30* 10/30/2014 0330   CREATININE 0.8 11/27/2014 1105   CREATININE 0.84 10/30/2014 0330      Component Value Date/Time   CALCIUM 9.3 11/27/2014 1105   CALCIUM 9.1 10/30/2014 0330   ALKPHOS 68 11/27/2014 1105   ALKPHOS 66 10/28/2014 2248   AST 24 11/27/2014 1105   AST 27 10/28/2014 2248   ALT 54 11/27/2014 1105   ALT 47* 10/28/2014 2248   BILITOT 0.34 11/27/2014 1105   BILITOT 0.5 10/28/2014 2248      ASSESSMENT & PLAN:  Metastatic adenocarcinoma to brain On further review of her pathology, I decided to change her treatment to carboplatin and Alimta. She will return in 2 days and to get her B-12 injection. I have requested the pathology sample to be sent to [facility]. Unfortunately, insufficient tissue sample was encountered. Instead, last week, I sent a blood work to get tested with Guardant 360. If mutation is detected, she may qualify for targeted therapy. I discussed with the risk, benefit, side effects of carboplatin and Alimta including risk of allergic reaction, pancytopenia, risk of infection etc. and she agreed to proceed. I will see her in 2 weeks for further supportive care.   COPD (chronic obstructive pulmonary  disease) She has chronic shortness of breath from COPD. this has improved. The patient had quit smoking. She will continue to use inhaler as needed.     Metastatic cancer to brain She is finishing radiation treatment. She is on a taper course of dexamethasone. Continue aggressive supportive care.    Orders Placed This Encounter  Procedures  . CBC with Differential    Standing Status: Standing     Number of Occurrences: 20     Standing Expiration Date: 12/03/2015  . Comprehensive metabolic panel    Standing Status: Standing     Number of Occurrences: 20     Standing Expiration Date: 12/03/2015   All questions were answered. The patient knows to call the clinic with any problems, questions or concerns. No barriers to learning was detected. I spent 30 minutes counseling the patient face to face. The total time spent in the appointment was 40 minutes and more than 50% was on counseling and review of test results     [facility], [place], MD 12/02/2014 4:48 PM

## 2014-12-02 NOTE — Assessment & Plan Note (Signed)
She has chronic shortness of breath from COPD. this has improved. The patient had quit smoking. She will continue to use inhaler as needed.

## 2014-12-02 NOTE — Telephone Encounter (Signed)
Called Shanay to see if she forgot her walker in the radiation exam room.  Requested a return call.

## 2014-12-02 NOTE — Assessment & Plan Note (Signed)
She is finishing radiation treatment. She is on a taper course of dexamethasone. Continue aggressive supportive care.

## 2014-12-03 ENCOUNTER — Other Ambulatory Visit: Payer: Self-pay | Admitting: Radiology

## 2014-12-04 ENCOUNTER — Ambulatory Visit (HOSPITAL_COMMUNITY)
Admission: RE | Admit: 2014-12-04 | Discharge: 2014-12-04 | Disposition: A | Discharge status: Home / Self Care | Payer: Medicare Other | Source: Ambulatory Visit | Attending: Hematology and Oncology | Admitting: Hematology and Oncology

## 2014-12-04 ENCOUNTER — Other Ambulatory Visit (HOSPITAL_BASED_OUTPATIENT_CLINIC_OR_DEPARTMENT_OTHER): Payer: Medicare Other

## 2014-12-04 ENCOUNTER — Ambulatory Visit (HOSPITAL_COMMUNITY)
Admission: RE | Admit: 2014-12-04 | Discharge: 2014-12-04 | Disposition: A | Discharge status: Home / Self Care | Payer: Medicare Other | Source: Ambulatory Visit | Attending: Interventional Radiology | Admitting: Interventional Radiology

## 2014-12-04 ENCOUNTER — Encounter (HOSPITAL_COMMUNITY): Payer: Self-pay

## 2014-12-04 ENCOUNTER — Other Ambulatory Visit: Payer: Self-pay | Admitting: Hematology and Oncology

## 2014-12-04 ENCOUNTER — Ambulatory Visit (HOSPITAL_BASED_OUTPATIENT_CLINIC_OR_DEPARTMENT_OTHER): Payer: Medicare Other

## 2014-12-04 DIAGNOSIS — C3490 Malignant neoplasm of unspecified part of unspecified bronchus or lung: Secondary | ICD-10-CM

## 2014-12-04 DIAGNOSIS — I10 Essential (primary) hypertension: Secondary | ICD-10-CM | POA: Diagnosis not present

## 2014-12-04 DIAGNOSIS — F329 Major depressive disorder, single episode, unspecified: Secondary | ICD-10-CM | POA: Diagnosis not present

## 2014-12-04 DIAGNOSIS — Z87891 Personal history of nicotine dependence: Secondary | ICD-10-CM | POA: Diagnosis not present

## 2014-12-04 DIAGNOSIS — E669 Obesity, unspecified: Secondary | ICD-10-CM | POA: Insufficient documentation

## 2014-12-04 DIAGNOSIS — J449 Chronic obstructive pulmonary disease, unspecified: Secondary | ICD-10-CM | POA: Diagnosis not present

## 2014-12-04 DIAGNOSIS — E785 Hyperlipidemia, unspecified: Secondary | ICD-10-CM | POA: Insufficient documentation

## 2014-12-04 DIAGNOSIS — C7931 Secondary malignant neoplasm of brain: Secondary | ICD-10-CM

## 2014-12-04 DIAGNOSIS — Z6841 Body Mass Index (BMI) 40.0 and over, adult: Secondary | ICD-10-CM | POA: Diagnosis not present

## 2014-12-04 DIAGNOSIS — Z7982 Long term (current) use of aspirin: Secondary | ICD-10-CM | POA: Diagnosis not present

## 2014-12-04 DIAGNOSIS — C801 Malignant (primary) neoplasm, unspecified: Secondary | ICD-10-CM

## 2014-12-04 LAB — CBC WITH DIFFERENTIAL/PLATELET
Basophils Absolute: 0 10*3/uL (ref 0.0–0.1)
Basophils Relative: 0 % (ref 0–1)
EOS ABS: 0 10*3/uL (ref 0.0–0.7)
Eosinophils Relative: 1 % (ref 0–5)
HCT: 45.5 % (ref 36.0–46.0)
HEMOGLOBIN: 14.6 g/dL (ref 12.0–15.0)
LYMPHS PCT: 16 % (ref 12–46)
Lymphs Abs: 1 10*3/uL (ref 0.7–4.0)
MCH: 29.4 pg (ref 26.0–34.0)
MCHC: 32.1 g/dL (ref 30.0–36.0)
MCV: 91.7 fL (ref 78.0–100.0)
MONOS PCT: 6 % (ref 3–12)
Monocytes Absolute: 0.4 10*3/uL (ref 0.1–1.0)
NEUTROS ABS: 4.7 10*3/uL (ref 1.7–7.7)
NEUTROS PCT: 77 % (ref 43–77)
PLATELETS: 153 10*3/uL (ref 150–400)
RBC: 4.96 MIL/uL (ref 3.87–5.11)
RDW: 15.5 % (ref 11.5–15.5)
WBC: 6.1 10*3/uL (ref 4.0–10.5)

## 2014-12-04 LAB — COMPREHENSIVE METABOLIC PANEL (CC13)
ALBUMIN: 3.3 g/dL — AB (ref 3.5–5.0)
ALT: 33 U/L (ref 0–55)
AST: 12 U/L (ref 5–34)
Alkaline Phosphatase: 69 U/L (ref 40–150)
Anion Gap: 12 mEq/L — ABNORMAL HIGH (ref 3–11)
BUN: 19.7 mg/dL (ref 7.0–26.0)
CO2: 26 meq/L (ref 22–29)
Calcium: 9.2 mg/dL (ref 8.4–10.4)
Chloride: 104 mEq/L (ref 98–109)
Creatinine: 0.8 mg/dL (ref 0.6–1.1)
EGFR: 77 mL/min/{1.73_m2} — ABNORMAL LOW (ref >90)
GLUCOSE: 117 mg/dL (ref 70–140)
POTASSIUM: 3.7 meq/L (ref 3.5–5.1)
Sodium: 142 mEq/L (ref 136–145)
Total Bilirubin: 0.39 mg/dL (ref 0.20–1.20)
Total Protein: 6.4 g/dL (ref 6.4–8.3)

## 2014-12-04 LAB — PROTIME-INR
INR: 1 (ref 0.00–1.49)
Prothrombin Time: 13.3 seconds (ref 11.6–15.2)

## 2014-12-04 LAB — APTT: APTT: 25 s (ref 24–37)

## 2014-12-04 MED ORDER — HEPARIN SOD (PORK) LOCK FLUSH 100 UNIT/ML IV SOLN
INTRAVENOUS | Status: AC | PRN
  Administered 2014-12-04: 500 [IU]

## 2014-12-04 MED ORDER — MIDAZOLAM HCL 2 MG/2ML IJ SOLN
INTRAMUSCULAR | Status: AC
  Filled 2014-12-04: qty 6

## 2014-12-04 MED ORDER — CEFAZOLIN SODIUM 10 G IJ SOLR
3.0000 g | INTRAMUSCULAR | Status: AC
  Administered 2014-12-04: 3 g via INTRAVENOUS
  Filled 2014-12-04: qty 3000

## 2014-12-04 MED ORDER — FENTANYL CITRATE 0.05 MG/ML IJ SOLN
INTRAMUSCULAR | Status: AC | PRN
  Administered 2014-12-04: 50 ug via INTRAVENOUS

## 2014-12-04 MED ORDER — LIDOCAINE HCL 1 % IJ SOLN
INTRAMUSCULAR | Status: AC
  Filled 2014-12-04: qty 20

## 2014-12-04 MED ORDER — CYANOCOBALAMIN 1000 MCG/ML IJ SOLN
1000.0000 ug | Freq: Once | INTRAMUSCULAR | Status: AC
  Administered 2014-12-04: 1000 ug via INTRAMUSCULAR

## 2014-12-04 MED ORDER — SODIUM CHLORIDE 0.9 % IV SOLN
INTRAVENOUS | Status: DC
  Administered 2014-12-04: 12:00:00 via INTRAVENOUS

## 2014-12-04 MED ORDER — CEFAZOLIN SODIUM-DEXTROSE 2-3 GM-% IV SOLR
INTRAVENOUS | Status: AC
  Filled 2014-12-04: qty 50

## 2014-12-04 MED ORDER — MIDAZOLAM HCL 2 MG/2ML IJ SOLN
INTRAMUSCULAR | Status: AC | PRN
  Administered 2014-12-04 (×4): 1 mg via INTRAVENOUS

## 2014-12-04 MED ORDER — LIDOCAINE-EPINEPHRINE 2 %-1:100000 IJ SOLN
INTRAMUSCULAR | Status: AC
  Filled 2014-12-04: qty 1

## 2014-12-04 MED ORDER — FENTANYL CITRATE 0.05 MG/ML IJ SOLN
INTRAMUSCULAR | Status: AC
  Filled 2014-12-04: qty 4

## 2014-12-04 NOTE — Procedures (Signed)
R IJ Port cathter placement with US and fluoroscopy No complication No blood loss. See complete dictation in [facility] PACS.  

## 2014-12-04 NOTE — Patient Instructions (Signed)

## 2014-12-04 NOTE — Discharge Instructions (Signed)
Leave dressing on for 24 hours.  You may shower after 24 hours.  Please remove the dressing before you shower.   ° ° °Implanted Port Insertion, Care After °Refer to this sheet in the next few weeks. These instructions provide you with information on caring for yourself after your procedure. Your health care provider may also give you more specific instructions. Your treatment has been planned according to current medical practices, but problems sometimes occur. Call your health care provider if you have any problems or questions after your procedure. °WHAT TO EXPECT AFTER THE PROCEDURE °After your procedure, it is typical to have the following:  °· Discomfort at the port insertion site. Ice packs to the area will help. °· Bruising on the skin over the port. This will subside in 3-4 days. °HOME CARE INSTRUCTIONS °· After your port is placed, you will get a manufacturer's information card. The card has information about your port. Keep this card with you at all times.   °· Know what kind of port you have. There are many types of ports available.   °· Wear a medical alert bracelet in case of an emergency. This can help alert health care workers that you have a port.   °· The port can stay in for as long as your health care provider believes it is necessary.   °· A home health care nurse may give medicines and take care of the port.   °· You or a family member can get special training and directions for giving medicine and taking care of the port at home.   °SEEK MEDICAL CARE IF:  °· Your port does not flush or you are unable to get a blood return.   °· You have a fever or chills. °SEEK IMMEDIATE MEDICAL CARE IF: °· You have new fluid or pus coming from your incision.   °· You notice a bad smell coming from your incision site.   °· You have swelling, pain, or more redness at the incision or port site.   °· You have chest pain or shortness of breath. °Document Released: 06/19/2013 Document Revised: 09/03/2013 Document  Reviewed: 06/19/2013 °ExitCare® Patient Information ©2015 [employer]. This information is not intended to replace advice given to you by your health care provider. Make sure you discuss any questions you have with your health care provider. °Implanted Port Home Guide °An implanted port is a type of central line that is placed under the skin. Central lines are used to provide IV access when treatment or nutrition needs to be given through a person's veins. Implanted ports are used for long-term IV access. An implanted port may be placed because:  °· You need IV medicine that would be irritating to the small veins in your hands or arms.   °· You need long-term IV medicines, such as antibiotics.   °· You need IV nutrition for a long period.   °· You need frequent blood draws for lab tests.   °· You need dialysis.   °Implanted ports are usually placed in the chest area, but they can also be placed in the upper arm, the abdomen, or the leg. An implanted port has two main parts:  °· Reservoir. The reservoir is round and will appear as a small, raised area under your skin. The reservoir is the part where a needle is inserted to give medicines or draw blood.   °· Catheter. The catheter is a thin, flexible tube that extends from the reservoir. The catheter is placed into a large vein. Medicine that is inserted into the reservoir goes into   the catheter and then into the vein.   °HOW WILL I CARE FOR MY INCISION SITE? °Do not get the incision site wet. Bathe or shower as directed by your health care provider.  °HOW IS MY PORT ACCESSED? °Special steps must be taken to access the port:  °· Before the port is accessed, a numbing cream can be placed on the skin. This helps numb the skin over the port site.   °· Your health care provider uses a sterile technique to access the port. °· Your health care provider must put on a mask and sterile gloves. °· The skin over your port is cleaned carefully with an antiseptic and allowed to  dry. °· The port is gently pinched between sterile gloves, and a needle is inserted into the port. °· Only "non-coring" port needles should be used to access the port. Once the port is accessed, a blood return should be checked. This helps ensure that the port is in the vein and is not clogged.   °· If your port needs to remain accessed for a constant infusion, a clear (transparent) bandage will be placed over the needle site. The bandage and needle will need to be changed every week, or as directed by your health care provider.   °· Keep the bandage covering the needle clean and dry. Do not get it wet. Follow your health care provider's instructions on how to take a shower or bath while the port is accessed.   °· If your port does not need to stay accessed, no bandage is needed over the port.   °WHAT IS FLUSHING? °Flushing helps keep the port from getting clogged. Follow your health care provider's instructions on how and when to flush the port. Ports are usually flushed with saline solution or a medicine called heparin. The need for flushing will depend on how the port is used.  °· If the port is used for intermittent medicines or blood draws, the port will need to be flushed:   °· After medicines have been given.   °· After blood has been drawn.   °· As part of routine maintenance.   °· If a constant infusion is running, the port may not need to be flushed.   °HOW LONG WILL MY PORT STAY IMPLANTED? °The port can stay in for as long as your health care provider thinks it is needed. When it is time for the port to come out, surgery will be done to remove it. The procedure is similar to the one performed when the port was put in.  °WHEN SHOULD I SEEK IMMEDIATE MEDICAL CARE? °When you have an implanted port, you should seek immediate medical care if:  °· You notice a bad smell coming from the incision site.   °· You have swelling, redness, or drainage at the incision site.   °· You have more swelling or pain at the  port site or the surrounding area.   °· You have a fever that is not controlled with medicine. °Document Released: 08/29/2005 Document Revised: 06/19/2013 Document Reviewed: 05/06/2013 °ExitCare® Patient Information ©2015 [employer]. This information is not intended to replace advice given to you by your health care provider. Make sure you discuss any questions you have with your health care provider. °Conscious Sedation, Adult, Care After °Refer to this sheet in the next few weeks. These instructions provide you with information on caring for yourself after your procedure. Your health care provider may also give you more specific instructions. Your treatment has been planned according to current medical practices, but problems sometimes occur.   Call your health care provider if you have any problems or questions after your procedure. °WHAT TO EXPECT AFTER THE PROCEDURE  °After your procedure: °· You may feel sleepy, clumsy, and have poor balance for several hours. °· Vomiting may occur if you eat too soon after the procedure. °HOME CARE INSTRUCTIONS °· Do not participate in any activities where you could become injured for at least 24 hours. Do not: °¨ Drive. °¨ Swim. °¨ Ride a bicycle. °¨ Operate heavy machinery. °¨ Cook. °¨ Use power tools. °¨ Climb ladders. °¨ Work from a high place. °· Do not make important decisions or sign legal documents until you are improved. °· If you vomit, drink water, juice, or soup when you can drink without vomiting. Make sure you have little or no nausea before eating solid foods. °· Only take over-the-counter or prescription medicines for pain, discomfort, or fever as directed by your health care provider. °· Make sure you and your family fully understand everything about the medicines given to you, including what side effects may occur. °· You should not drink alcohol, take sleeping pills, or take medicines that cause drowsiness for at least 24 hours. °· If you smoke, do not  smoke without supervision. °· If you are feeling better, you may resume normal activities 24 hours after you were sedated. °· Keep all appointments with your health care provider. °SEEK MEDICAL CARE IF: °· Your skin is pale or bluish in color. °· You continue to feel nauseous or vomit. °· Your pain is getting worse and is not helped by medicine. °· You have bleeding or swelling. °· You are still sleepy or feeling clumsy after 24 hours. °SEEK IMMEDIATE MEDICAL CARE IF: °· You develop a rash. °· You have difficulty breathing. °· You develop any type of allergic problem. °· You have a fever. °MAKE SURE YOU: °· Understand these instructions. °· Will watch your condition. °· Will get help right away if you are not doing well or get worse. °Document Released: 06/19/2013 Document Reviewed: 06/19/2013 °ExitCare® Patient Information ©2015 [employer]. This information is not intended to replace advice given to you by your health care provider. Make sure you discuss any questions you have with your health care provider. ° ° ° °

## 2014-12-04 NOTE — H&P (Signed)
Chief Complaint: "I'm here for a port a cath"  Referring Physician(s): Gorsuch,Ni  History of Present Illness: Caitlin Carter is a 69 y.o. female with history of metastatic poorly differentiated lung adenocarcinoma who presents today for Port-A-Cath placement for chemotherapy.  Past Medical History  Diagnosis Date  . HTN (hypertension)   . Obesity   . HLD (hyperlipidemia)   . Depression   . Brain metastases   . COPD (chronic obstructive pulmonary disease)     Past Surgical History  Procedure Laterality Date  . Replacement total knee      Allergies: Review of patient's allergies indicates no known allergies.  Medications: Prior to Admission medications   Medication Sig Start Date End Date Taking? Authorizing Provider  albuterol (PROVENTIL HFA;VENTOLIN HFA) 108 (90 BASE) MCG/ACT inhaler Inhale 1-2 puffs into the lungs every 4 (four) hours as needed for wheezing or shortness of breath. 10/30/14   Allie Bossier, MD  aspirin 81 MG tablet Take 81 mg by mouth daily.    Historical Provider, MD  Black Cohosh 160 MG CAPS Take 160 mg by mouth daily.    Historical Provider, MD  calcium-vitamin D (OSCAL WITH D) 500-200 MG-UNIT per tablet Take 1 tablet by mouth daily with breakfast.    Historical Provider, MD  dexamethasone (DECADRON) 4 MG tablet Take 1 tablet (4 mg total) by mouth 4 (four) times daily. Patient taking differently: Take 2 mg by mouth 3 (three) times daily. Per Dr. Sondra Come, dosage decreased to 2 mg three times a day.  Refill called in to [facility] 11/20/14.  Disp. 40 tablets. 0 refills. 10/30/14   Allie Bossier, MD  emollient (BIAFINE) cream Apply topically as needed.    Historical Provider, MD  folic acid (FOLVITE) 1 MG tablet Take 1 tablet (1 mg total) by mouth daily. Continue until 21 days after Alimta completed. 12/02/14   Heath Lark, MD  Garlic 10 MG CAPS Take 10 mg elemental calcium/kg/hr by mouth daily.    Historical Provider, MD    glucosamine-chondroitin 500-400 MG tablet Take 2 tablets by mouth daily.    Historical Provider, MD  levETIRAcetam (KEPPRA) 500 MG tablet Take 1 tablet (500 mg total) by mouth 2 (two) times daily. 12/01/14   Heath Lark, MD  lidocaine-prilocaine (EMLA) cream Apply to affected area once 12/02/14   Heath Lark, MD  lisinopril-hydrochlorothiazide (PRINZIDE,ZESTORETIC) 20-25 MG per tablet Take 1 tablet by mouth daily. 11/12/14   Erlene Quan, PA-C  Melatonin 10 MG CAPS Take 10 mg by mouth at bedtime as needed (sleep).     Historical Provider, MD  Multiple Vitamin (MULTIVITAMIN) tablet Take 1 tablet by mouth daily. Silver centrum    Historical Provider, MD  nicotine (NICODERM CQ - DOSED IN MG/24 HOURS) 21 mg/24hr patch Place 1 patch (21 mg total) onto the skin daily. 10/30/14   Allie Bossier, MD  Omega-3 Fatty Acids (FISH OIL) 1000 MG CAPS Take 1,000 mg by mouth daily.    Historical Provider, MD  ondansetron (ZOFRAN) 8 MG tablet Take 1 tablet (8 mg total) by mouth every 8 (eight) hours as needed. 12/02/14   Heath Lark, MD  pantoprazole (PROTONIX) 40 MG tablet TAKE 1 TABLET BY MOUTH DAILY AT [facility] 11/27/14   Heath Lark, MD  PARoxetine (PAXIL) 20 MG tablet Take 20 mg by mouth daily as needed (restlessness).     Historical Provider, MD  prochlorperazine (COMPAZINE) 10 MG tablet Take 1 tablet (10 mg total) by mouth  every 6 (six) hours as needed (Nausea or vomiting). 12/02/14   Heath Lark, MD  simvastatin (ZOCOR) 40 MG tablet Take 40 mg by mouth daily.    Historical Provider, MD    Family History  Problem Relation Age of Onset  . CAD    . Diabetes Mother   . Breast cancer Mother   . Heart attack Father   . Lung cancer Father     History   Social History  . Marital Status: Widowed    Spouse Name: N/A  . Number of Children: N/A  . Years of Education: N/A   Social History Main Topics  . Smoking status: Former Smoker -- 1.00 packs/day for 51 years    Types: Cigarettes    Start date: 09/13/1963    Quit  date: 10/14/2014  . Smokeless tobacco: Never Used  . Alcohol Use: Not on file  . Drug Use: Not on file  . Sexual Activity: Not on file   Other Topics Concern  . None   Social History Narrative      Review of Systems  Constitutional: Negative for fever and chills.  Respiratory: Positive for shortness of breath. Negative for cough.   Cardiovascular: Negative for chest pain.  Gastrointestinal: Negative for nausea, vomiting and abdominal pain.  Genitourinary: Negative for dysuria and hematuria.  Musculoskeletal: Negative for back pain.  Neurological:       Occ HA's    Vital Signs: BP 108/53 mmHg  Pulse 69  Temp(Src) 98.7 F (37.1 C) (Oral)  Resp 18  Ht 5\' 5"  (1.651 m)  Wt 281 lb 9 oz (127.716 kg)  BMI 46.85 kg/m2  SpO2 93%  Physical Exam  Constitutional: She is oriented to person, place, and time. She appears well-developed and well-nourished.  Eyes:   Mild rt ptosis  Neck:  Left neck adenopathy  Cardiovascular: Normal rate and regular rhythm.   Pulmonary/Chest: Effort normal and breath sounds normal.  Abdominal: Soft. Bowel sounds are normal. There is no tenderness.  obese  Musculoskeletal: Normal range of motion. She exhibits edema.  Neurological: She is alert and oriented to person, place, and time.    Imaging: Nm Pet Image Initial (pi) Skull Base To Thigh  11/05/2014   CLINICAL DATA:  Initial treatment strategy for lung cancer.  EXAM: NUCLEAR MEDICINE PET SKULL BASE TO THIGH  TECHNIQUE: 14.0 mCi F-18 FDG was injected intravenously. Full-ring PET imaging was performed from the skull base to thigh after the radiotracer. CT data was obtained and used for attenuation correction and anatomic localization.  FASTING BLOOD GLUCOSE:  Value: Ninety mg/dl  COMPARISON:  CT abdomen pelvis 10/25/2014 and CT chest 10/23/2014.  FINDINGS: NECK  There is a hypermetabolic 1.7 cm left level 2 lymph node, with an SUV max of 13.6. No additional hypermetabolic lymph nodes in the neck. CT  images show scattered areas of predominantly white matter edema in the frontal and parietal lobes bilaterally.  CHEST  Hypermetabolic mediastinal and left hilar lymph nodes are seen. Index subcarinal lymph node measures 2.3 cm with an SUV max of 10.5. An 11 mm nodule in the medial left lower lobe has an SUV max of 3.1. CT images show no acute findings. Three-vessel coronary artery calcification. Heart is enlarged. No pericardial or pleural effusion.  ABDOMEN/PELVIS  No abnormal hypermetabolism in the liver, adrenal glands, spleen or pancreas. There is focal hypermetabolism associated with suspected eccentric rectal wall thickening, measuring approximately 2.4 x 2.7 cm (CT image 195), with an SUV max of  13.1. No adjacent adenopathy. No hypermetabolic lymph nodes. CT images show the liver, gallbladder, adrenal glands, kidneys, spleen, pancreas, stomach and bowel is otherwise grossly unremarkable. No free fluid.  SKELETON  No abnormal osseous hypermetabolism. Degenerative changes are seen in the spine.  IMPRESSION: 1. Hypermetabolic mediastinal, left hilar and left neck adenopathy with a mildly hypermetabolic left lower lobe nodule. 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 2. Possible rectal mass with associated hypermetabolism. 3. Three-vessel coronary artery calcification.   Electronically Signed   By: Lorin Picket M.D.   On: 11/05/2014 16:26   US Biopsy  11/10/2014   CLINICAL DATA:  69 year old with probable metastatic lung cancer. Patient has a hypermetabolic lymph node in the left submandibular region. Tissue diagnosis is needed.  EXAM: ULTRASOUND GUIDED BIOPSY OF LEFT SUBMANDIBULAR LYMPH NODE.  COMPARISON:  PET-CT 11/05/2014  PROCEDURE: The procedure was explained to the patient. The risks and benefits of the procedure were discussed and the patient's questions were addressed. Informed consent was obtained from the patient. The left side of the neck was evaluated with  ultrasound. Enlarged lymph node in the left submandibular region was identified. The left submandibular region was prepped and draped in sterile fashion. 1% lidocaine was used for local anesthetic. Using ultrasound guidance, a total of 6 fine needle aspirations were obtained. Four of the aspirations were obtained with 25 gauge needles. Two of the samples were obtained with Inrad needles for cell block. Bandage placed over the puncture site.  Complications:  None  FINDINGS: Enlarged left submandibular lymph node measuring up to 2.9 cm. Needle position confirmed within the lesion.  IMPRESSION: Ultrasound-guided fine needle aspirations of the enlarged left submandibular lymph node.   Electronically Signed   By: Markus Daft M.D.   On: 11/10/2014 18:16    Labs:  CBC:  Recent Labs  10/28/14 2248 10/30/14 0330 11/10/14 1137 11/27/14 1105  WBC 13.0* 12.5* 12.2* 5.4  HGB 17.5* 17.7* 16.3* 14.3  HCT 53.6* 52.5* 49.1* 44.5  PLT 229 208 174 182    COAGS:  Recent Labs  10/23/14 1542 11/10/14 1137  INR 0.99 0.99  APTT 32 21*    BMP:  Recent Labs  10/27/14 0240 10/28/14 0307 10/28/14 2248 10/30/14 0330 11/27/14 1105  NA 141 139 138 138 144  K 4.8 4.3 4.8 4.9 4.7  CL 103 101 102 106  --   CO2 31 24 27 23 23   GLUCOSE 108* 109* 137* 102* 66*  BUN 27* 38* 41* 30* 27.5*  CALCIUM 9.3 9.8 9.2 9.1 9.3  CREATININE 1.00 0.97 1.34* 0.84 0.8  GFRNONAA 57* 59* 40* 70*  --   GFRAA 66* 68* 46* 81*  --     LIVER FUNCTION TESTS:  Recent Labs  10/26/14 0258 10/27/14 0240 10/28/14 2248 11/27/14 1105  BILITOT 0.8 0.7 0.5 0.34  AST 71* 36 27 24  ALT 62* 55* 47* 54  ALKPHOS 59 60 66 68  PROT 7.1 7.0 6.9 6.5  ALBUMIN 3.6 3.4* 3.7 3.1*    TUMOR MARKERS: No results for input(s): AFPTM, CEA, CA199, CHROMGRNA in the last 8760 hours.  Assessment and Plan:  Caitlin Carter is a 69 y.o. female with history of metastatic poorly differentiated lung adenocarcinoma who presents today for  Port-A-Cath placement for chemotherapy.Risks and benefits discussed with the patient including, but not limited to bleeding, infection, pneumothorax, or fibrin sheath development and need for additional procedures. All of the patient's questions were answered, patient is agreeable  to proceed. Consent signed and in chart.      Signed: Autumn Messing 12/04/2014, 12:21 PM   I spent a total of 20 minutes face to face in clinical consultation, greater than 50% of which was counseling/coordinating care for Port-A-Cath placement

## 2014-12-05 ENCOUNTER — Encounter: Payer: Self-pay | Admitting: *Deleted

## 2014-12-05 NOTE — Progress Notes (Signed)
[place] Psychosocial Distress Screening Clinical Social Work  Clinical Social Work was referred by distress screening protocol.  The patient scored a 5 on the Psychosocial Distress Thermometer which indicates moderate distress. Clinical Social Worker attempted to contact patient by phone to assess for distress and other psychosocial needs.   ONCBCN DISTRESS SCREENING 11/06/2014  Screening Type Initial Screening  Distress experienced in past week (1-10) 5  Emotional problem type Depression;Nervousness/Anxiety  Information Concerns Type Lack of info about diagnosis;Lack of info about treatment  Physical Problem type Constipation/diarrhea  Physician notified of physical symptoms Yes  Referral to clinical social work Yes    Clinical Social Worker follow up needed: Yes.    If yes, follow up plan:  CSW left voicemail for patient to return call when convenient.  Polo Riley, MSW, LCSW, OSW-C Clinical Social Worker [facility] [phone]

## 2014-12-09 ENCOUNTER — Emergency Department (HOSPITAL_COMMUNITY)
Admission: EM | Admit: 2014-12-09 | Discharge: 2014-12-09 | Disposition: A | Discharge status: Home / Self Care | Payer: Medicare Other | Attending: Emergency Medicine | Admitting: Emergency Medicine

## 2014-12-09 ENCOUNTER — Encounter (HOSPITAL_COMMUNITY): Payer: Self-pay | Admitting: *Deleted

## 2014-12-09 ENCOUNTER — Telehealth: Payer: Self-pay | Admitting: *Deleted

## 2014-12-09 ENCOUNTER — Emergency Department (HOSPITAL_COMMUNITY): Payer: Medicare Other

## 2014-12-09 DIAGNOSIS — E669 Obesity, unspecified: Secondary | ICD-10-CM | POA: Diagnosis not present

## 2014-12-09 DIAGNOSIS — Z7951 Long term (current) use of inhaled steroids: Secondary | ICD-10-CM | POA: Diagnosis not present

## 2014-12-09 DIAGNOSIS — C7800 Secondary malignant neoplasm of unspecified lung: Secondary | ICD-10-CM | POA: Diagnosis not present

## 2014-12-09 DIAGNOSIS — R42 Dizziness and giddiness: Secondary | ICD-10-CM | POA: Insufficient documentation

## 2014-12-09 DIAGNOSIS — Z7982 Long term (current) use of aspirin: Secondary | ICD-10-CM | POA: Diagnosis not present

## 2014-12-09 DIAGNOSIS — E785 Hyperlipidemia, unspecified: Secondary | ICD-10-CM | POA: Diagnosis not present

## 2014-12-09 DIAGNOSIS — Z87891 Personal history of nicotine dependence: Secondary | ICD-10-CM | POA: Diagnosis not present

## 2014-12-09 DIAGNOSIS — R112 Nausea with vomiting, unspecified: Secondary | ICD-10-CM | POA: Insufficient documentation

## 2014-12-09 DIAGNOSIS — I10 Essential (primary) hypertension: Secondary | ICD-10-CM | POA: Insufficient documentation

## 2014-12-09 DIAGNOSIS — C7931 Secondary malignant neoplasm of brain: Secondary | ICD-10-CM | POA: Diagnosis not present

## 2014-12-09 DIAGNOSIS — Z79899 Other long term (current) drug therapy: Secondary | ICD-10-CM | POA: Diagnosis not present

## 2014-12-09 DIAGNOSIS — R531 Weakness: Secondary | ICD-10-CM

## 2014-12-09 LAB — CBC WITH DIFFERENTIAL/PLATELET
Basophils Absolute: 0 10*3/uL (ref 0.0–0.1)
Basophils Relative: 0 % (ref 0–1)
Eosinophils Absolute: 0 10*3/uL (ref 0.0–0.7)
Eosinophils Relative: 1 % (ref 0–5)
HCT: 46.5 % — ABNORMAL HIGH (ref 36.0–46.0)
Hemoglobin: 15.1 g/dL — ABNORMAL HIGH (ref 12.0–15.0)
Lymphocytes Relative: 26 % (ref 12–46)
Lymphs Abs: 1.2 10*3/uL (ref 0.7–4.0)
MCH: 29.8 pg (ref 26.0–34.0)
MCHC: 32.5 g/dL (ref 30.0–36.0)
MCV: 91.9 fL (ref 78.0–100.0)
Monocytes Absolute: 0.4 10*3/uL (ref 0.1–1.0)
Monocytes Relative: 8 % (ref 3–12)
Neutro Abs: 3.1 10*3/uL (ref 1.7–7.7)
Neutrophils Relative %: 65 % (ref 43–77)
Platelets: 143 10*3/uL — ABNORMAL LOW (ref 150–400)
RBC: 5.06 MIL/uL (ref 3.87–5.11)
RDW: 15.6 % — ABNORMAL HIGH (ref 11.5–15.5)
WBC: 4.8 10*3/uL (ref 4.0–10.5)

## 2014-12-09 LAB — COMPREHENSIVE METABOLIC PANEL
ALT: 28 U/L (ref 0–35)
AST: 18 U/L (ref 0–37)
Albumin: 3.5 g/dL (ref 3.5–5.2)
Alkaline Phosphatase: 61 U/L (ref 39–117)
Anion gap: 12 (ref 5–15)
BUN: 17 mg/dL (ref 6–23)
CO2: 25 mmol/L (ref 19–32)
Calcium: 9.1 mg/dL (ref 8.4–10.5)
Chloride: 105 mmol/L (ref 96–112)
Creatinine, Ser: 0.78 mg/dL (ref 0.50–1.10)
GFR calc Af Amer: >90 mL/min (ref >90)
GFR calc non Af Amer: 84 mL/min — ABNORMAL LOW (ref >90)
Glucose, Bld: 83 mg/dL (ref 70–99)
Potassium: 2.9 mmol/L — ABNORMAL LOW (ref 3.5–5.1)
Sodium: 142 mmol/L (ref 135–145)
Total Bilirubin: 0.5 mg/dL (ref 0.3–1.2)
Total Protein: 6.5 g/dL (ref 6.0–8.3)

## 2014-12-09 LAB — URINALYSIS, ROUTINE W REFLEX MICROSCOPIC
Bilirubin Urine: NEGATIVE
Glucose, UA: NEGATIVE mg/dL
Hgb urine dipstick: NEGATIVE
Ketones, ur: NEGATIVE mg/dL
Nitrite: NEGATIVE
Protein, ur: NEGATIVE mg/dL
Specific Gravity, Urine: 1.023 (ref 1.005–1.030)
Urobilinogen, UA: 0.2 mg/dL (ref 0.0–1.0)
pH: 7 (ref 5.0–8.0)

## 2014-12-09 LAB — I-STAT CG4 LACTIC ACID, ED: Lactic Acid, Venous: 1.17 mmol/L (ref 0.5–2.0)

## 2014-12-09 LAB — URINE MICROSCOPIC-ADD ON

## 2014-12-09 MED ORDER — MECLIZINE HCL 25 MG PO TABS
25.0000 mg | ORAL_TABLET | Freq: Once | ORAL | Status: AC
  Administered 2014-12-09: 25 mg via ORAL
  Filled 2014-12-09: qty 1

## 2014-12-09 MED ORDER — MECLIZINE HCL 12.5 MG PO TABS: 12.5000 mg | ORAL_TABLET | Freq: Three times a day (TID) | ORAL | Status: DC | PRN

## 2014-12-09 NOTE — ED Notes (Signed)
Pt aware a urine sample is needed unable to urinate at this time.

## 2014-12-09 NOTE — Progress Notes (Addendum)
69 yr old [place] [facility] medicare oncology pt with lung cancer met to brain, last radiation was Wednesday. Pt had port placed on Thursday. Reports low grade fever and constipation over weekend. Pt reports weakness/dizziness in last week that has progressed CM consult for home health services CM reviewed with pt, cousin and brother in details medicare guidelines, home health ([facility]) (length of stay in home, types of [facility] staff available, coverage, primary caregiver, up to 24 hrs before services may be started), Private duty nursing (PDN-coverage, length of stay in the home types of staff available), assisted living (ASL- coverage, services offered) and Skilled nursing facilities (snf- coverage and services offered)  CM reviewed availability of HH SW to assist pcp to get pt to snf (if desired disposition) from the community level. CM provided family with a list of [place] home health agencies, PDN.  Discussed pt last admission was on 10/23/14 at [facility] over 30 days ago, pt has not been in a facility in last 30 days and d/c and pending ED evaluation  Spoke with ED SW. CM and SW spoke with pt and family Agreed to d/c home with home health services to assist her with mobility, strength and facility placement [facility]  [place] updated  6 Sent Dr Alvy Bimler at [facility] in box note about pt colonoscopy re scheduling concern (pt states she has chemo on Thursday and then scheduled to have colonoscopy that Alvy Bimler approved and she would prefer to postpone) & initiation of home health from [facility] ED [facility], PT/OT/aode/SW)  [phone] Spoke with Cyril Mourning of [facility] about referrals Home health RN/PT/OT for evaluation and treatment, HH aide, HHSW for possible rehab facility placement, community resources SW please also consult [facility] cancer center Polo Riley [phone] Pt is staying at cousin [facility] home until Thursday 12/11/14 [phone] or [phone] and returning home 12/12/14 with  brother billy  [place] ED updated note to pcp, [place] informing her of pt [facility] ED 12/09/14 visit

## 2014-12-09 NOTE — Discharge Instructions (Signed)

## 2014-12-09 NOTE — ED Notes (Signed)
Pt reports lung cancer met to brain, last radiation was Wednesday. Pt had port placed on Thursday. Reports low grade fever over weekend. Pt reports weakness/dizziness in last week that has progressed. Denies pain, reports just being tired.

## 2014-12-09 NOTE — ED Notes (Signed)
MD at bedside. Will collect blood when MD leaves from bedside.

## 2014-12-09 NOTE — Telephone Encounter (Signed)
Informed pt ok to postpone her colonscopy for now.  She verbalized understanding and confirmed her appts this week and next week.

## 2014-12-09 NOTE — Telephone Encounter (Signed)
-----   Message from Heath Lark, MD sent at 12/09/2014  3:10 PM EDT ----- Regarding: RE: Questioning if her colonoscopy needs to be postponed  Ok to postpone until she feels better ----- Message -----    From: Barbaraann Faster, RN    Sent: 12/09/2014   2:48 PM      To: Heath Lark, MD Subject: Questioning if her colonoscopy needs to be p#  Dr Loma Messing was in [facility] ED on 12/09/14 and asked if I would contact you about her upcoming colonoscopy.   When she mentioned you know about her scheduled colonoscopy and you approved it I referred her to you BUT she and her cousin voiced interest in wanting it postponed. Requesting a call from you  In [facility] ED home health initiated for [facility], PT/OT/aide/SW

## 2014-12-10 ENCOUNTER — Other Ambulatory Visit: Payer: Self-pay | Admitting: Hematology and Oncology

## 2014-12-11 ENCOUNTER — Other Ambulatory Visit: Payer: Self-pay | Admitting: Hematology and Oncology

## 2014-12-11 ENCOUNTER — Ambulatory Visit (HOSPITAL_BASED_OUTPATIENT_CLINIC_OR_DEPARTMENT_OTHER): Payer: Medicare Other

## 2014-12-11 DIAGNOSIS — C7931 Secondary malignant neoplasm of brain: Secondary | ICD-10-CM

## 2014-12-11 DIAGNOSIS — Z5111 Encounter for antineoplastic chemotherapy: Secondary | ICD-10-CM

## 2014-12-11 DIAGNOSIS — C3490 Malignant neoplasm of unspecified part of unspecified bronchus or lung: Secondary | ICD-10-CM | POA: Diagnosis not present

## 2014-12-11 MED ORDER — SODIUM CHLORIDE 0.9 % IV SOLN
Freq: Once | INTRAVENOUS | Status: AC
  Administered 2014-12-11: 12:00:00 via INTRAVENOUS

## 2014-12-11 MED ORDER — SODIUM CHLORIDE 0.9 % IV SOLN
667.5000 mg | Freq: Once | INTRAVENOUS | Status: AC
  Administered 2014-12-11: 670 mg via INTRAVENOUS
  Filled 2014-12-11: qty 67

## 2014-12-11 MED ORDER — SODIUM CHLORIDE 0.9 % IV SOLN
500.0000 mg/m2 | Freq: Once | INTRAVENOUS | Status: AC
  Administered 2014-12-11: 1200 mg via INTRAVENOUS
  Filled 2014-12-11: qty 48

## 2014-12-11 MED ORDER — SODIUM CHLORIDE 0.9 % IJ SOLN
10.0000 mL | INTRAMUSCULAR | Status: DC | PRN
  Administered 2014-12-11: 10 mL
  Filled 2014-12-11: qty 10

## 2014-12-11 MED ORDER — HEPARIN SOD (PORK) LOCK FLUSH 100 UNIT/ML IV SOLN
500.0000 [IU] | Freq: Once | INTRAVENOUS | Status: AC | PRN
  Administered 2014-12-11: 500 [IU]
  Filled 2014-12-11: qty 5

## 2014-12-11 MED ORDER — DEXAMETHASONE SODIUM PHOSPHATE 100 MG/10ML IJ SOLN
Freq: Once | INTRAMUSCULAR | Status: AC
  Administered 2014-12-11: 12:00:00 via INTRAVENOUS
  Filled 2014-12-11: qty 8

## 2014-12-11 NOTE — ED Provider Notes (Signed)
CSN: [id number]     Arrival date & time 12/09/14  1108 History   First MD Initiated Contact with Patient 12/09/14 1132     Chief Complaint  Patient presents with  . ca pt, weakness, SOB      (Consider location/radiation/quality/duration/timing/severity/associated sxs/prior Treatment) HPI   69 year old female with dizziness. Intermittent. Describes sensation of room spinning. At times she feels like she needs to hold onto things to brace herself. Associated with nausea and vomiting. No acute pain. No fevers or chills. No tinnitus. Patient has a past history of metastatic lung cancer to the brain. Currently undergoing treatment.  Past Medical History  Diagnosis Date  . HTN (hypertension)   . Obesity   . HLD (hyperlipidemia)   . Depression   . Brain metastases   . COPD (chronic obstructive pulmonary disease)    Past Surgical History  Procedure Laterality Date  . Replacement total knee     Family History  Problem Relation Age of Onset  . CAD    . Diabetes Mother   . Breast cancer Mother   . Heart attack Father   . Lung cancer Father    History  Substance Use Topics  . Smoking status: Former Smoker -- 1.00 packs/day for 51 years    Types: Cigarettes    Start date: 09/13/1963    Quit date: 10/14/2014  . Smokeless tobacco: Never Used  . Alcohol Use: Not on file   OB History    No data available     Review of Systems  All systems reviewed and negative, other than as noted in HPI.   Allergies  Review of patient's allergies indicates no known allergies.  Home Medications   Prior to Admission medications   Medication Sig Start Date End Date Taking? Authorizing Provider  albuterol (PROVENTIL HFA;VENTOLIN HFA) 108 (90 BASE) MCG/ACT inhaler Inhale 1-2 puffs into the lungs every 4 (four) hours as needed for wheezing or shortness of breath. 10/30/14  Yes Allie Bossier, MD  aspirin 81 MG tablet Take 81 mg by mouth daily.   Yes Historical Provider, MD  Black Cohosh 160 MG  CAPS Take 160 mg by mouth daily.   Yes Historical Provider, MD  calcium-vitamin D (OSCAL WITH D) 500-200 MG-UNIT per tablet Take 1 tablet by mouth daily with breakfast.   Yes Historical Provider, MD  dexamethasone (DECADRON) 4 MG tablet Take 1 tablet (4 mg total) by mouth 4 (four) times daily. Patient taking differently: Take 2 mg by mouth 3 (three) times daily. Per Dr. Sondra Come, dosage decreased to 2 mg three times a day.  Refill called in to [facility] 11/20/14.  Disp. 40 tablets. 0 refills. 10/30/14  Yes Allie Bossier, MD  emollient (BIAFINE) cream Apply topically as needed.   Yes Historical Provider, MD  folic acid (FOLVITE) 1 MG tablet Take 1 tablet (1 mg total) by mouth daily. Continue until 21 days after Alimta completed. 12/02/14  Yes Heath Lark, MD  Garlic 10 MG CAPS Take 10 mg elemental calcium/kg/hr by mouth daily.   Yes Historical Provider, MD  glucosamine-chondroitin 500-400 MG tablet Take 2 tablets by mouth daily.   Yes Historical Provider, MD  lidocaine-prilocaine (EMLA) cream Apply to affected area once 12/02/14  Yes Heath Lark, MD  lisinopril-hydrochlorothiazide (PRINZIDE,ZESTORETIC) 20-25 MG per tablet Take 1 tablet by mouth daily. 11/12/14  Yes Erlene Quan, PA-C  Melatonin 10 MG CAPS Take 10 mg by mouth at bedtime as needed (sleep).    Yes  Historical Provider, MD  Multiple Vitamin (MULTIVITAMIN) tablet Take 1 tablet by mouth daily. Silver centrum   Yes Historical Provider, MD  nicotine (NICODERM CQ - DOSED IN MG/24 HOURS) 21 mg/24hr patch Place 1 patch (21 mg total) onto the skin daily. 10/30/14  Yes Allie Bossier, MD  Omega-3 Fatty Acids (FISH OIL) 1000 MG CAPS Take 1,000 mg by mouth daily.   Yes Historical Provider, MD  ondansetron (ZOFRAN) 8 MG tablet Take 1 tablet (8 mg total) by mouth every 8 (eight) hours as needed. 12/02/14  Yes Heath Lark, MD  pantoprazole (PROTONIX) 40 MG tablet TAKE 1 TABLET BY MOUTH DAILY AT NOON 11/27/14  Yes Heath Lark, MD  PARoxetine (PAXIL)  20 MG tablet Take 20 mg by mouth daily as needed (restlessness).    Yes Historical Provider, MD  prochlorperazine (COMPAZINE) 10 MG tablet Take 1 tablet (10 mg total) by mouth every 6 (six) hours as needed (Nausea or vomiting). 12/02/14  Yes Heath Lark, MD  simvastatin (ZOCOR) 40 MG tablet Take 40 mg by mouth daily.   Yes Historical Provider, MD  levETIRAcetam (KEPPRA) 500 MG tablet TAKE 1 TABLET BY MOUTH TWICE DAILY 12/11/14   Heath Lark, MD  meclizine (ANTIVERT) 12.5 MG tablet Take 1 tablet (12.5 mg total) by mouth 3 (three) times daily as needed for dizziness. 12/09/14   Virgel Manifold, MD   BP 140/56 mmHg  Pulse 86  Temp(Src) 98.4 F (36.9 C) (Oral)  Resp 20  SpO2 98% Physical Exam  Constitutional: She is oriented to person, place, and time. She appears well-developed and well-nourished. No distress.  HENT:  Head: Normocephalic and atraumatic.  Eyes: Conjunctivae are normal. Right eye exhibits no discharge. Left eye exhibits no discharge.  Neck: Neck supple.  Cardiovascular: Normal rate, regular rhythm and normal heart sounds.  Exam reveals no gallop and no friction rub.   No murmur heard. Pulmonary/Chest: Effort normal and breath sounds normal. No respiratory distress.  Abdominal: Soft. She exhibits no distension. There is no tenderness.  Musculoskeletal: She exhibits no edema or tenderness.  Neurological: She is alert and oriented to person, place, and time. No cranial nerve deficit. She exhibits normal muscle tone. Coordination normal.  Good finger to nose testing bilaterally  Skin: Skin is warm and dry.  Psychiatric: She has a normal mood and affect. Her behavior is normal. Thought content normal.  Nursing note and vitals reviewed.   ED Course  Procedures (including critical care time) Labs Review Labs Reviewed  CBC WITH DIFFERENTIAL/PLATELET - Abnormal; Notable for the following:    Hemoglobin 15.1 (*)    HCT 46.5 (*)    RDW 15.6 (*)    Platelets 143 (*)    All other  components within normal limits  COMPREHENSIVE METABOLIC PANEL - Abnormal; Notable for the following:    Potassium 2.9 (*)    GFR calc non Af Amer 84 (*)    All other components within normal limits  URINALYSIS, ROUTINE W REFLEX MICROSCOPIC - Abnormal; Notable for the following:    APPearance CLOUDY (*)    Leukocytes, UA SMALL (*)    All other components within normal limits  URINE MICROSCOPIC-ADD ON - Abnormal; Notable for the following:    Squamous Epithelial / LPF FEW (*)    All other components within normal limits  I-STAT CG4 LACTIC ACID, ED    Imaging Review No results found.   EKG Interpretation None      MDM   Final diagnoses:  Vertigo  69 year old female with vertigo based on her symptoms. She does have history of metastatic lung cancer to the brain. That being said, suspect peripheral etiology. Symptoms are positional, reproducible and she has a nonfocal neurological examination. Plan symptomatic treatment with when necessary meclizine. Patient family with additional social concerns. Patient lives alone. Care management with patient to arrange for additional home care. I feel she is appropriate for discharge at this time.It has been determined that no acute conditions requiring further emergency intervention are present at this time. The patient has been advised of the diagnosis and plan. I reviewed any labs and imaging including any potential incidental findings. We have discussed signs and symptoms that warrant return to the ED and they are listed in the discharge instructions.      Virgel Manifold, MD 12/12/14 [phone]

## 2014-12-11 NOTE — Patient Instructions (Signed)
[facility] Discharge Instructions for Patients Receiving Chemotherapy  Today you received the following chemotherapy agents Alimta/Carboplatin To help prevent nausea and vomiting after your treatment, we encourage you to take your nausea medication as prescribed.   If you develop nausea and vomiting that is not controlled by your nausea medication, call the clinic.   BELOW ARE SYMPTOMS THAT SHOULD BE REPORTED IMMEDIATELY:  *FEVER GREATER THAN 100.5 F  *CHILLS WITH OR WITHOUT FEVER  NAUSEA AND VOMITING THAT IS NOT CONTROLLED WITH YOUR NAUSEA MEDICATION  *UNUSUAL SHORTNESS OF BREATH  *UNUSUAL BRUISING OR BLEEDING  TENDERNESS IN MOUTH AND THROAT WITH OR WITHOUT PRESENCE OF ULCERS  *URINARY PROBLEMS  *BOWEL PROBLEMS  UNUSUAL RASH Items with * indicate a potential emergency and should be followed up as soon as possible.  Feel free to call the clinic you have any questions or concerns. The clinic phone number is [phone].  Please show the [facility] at check-in to the Emergency Department and triage nurse.  Pemetrexed injection (Alimta) What is this medicine? PEMETREXED (PEM e TREX ed) is a chemotherapy drug. This medicine affects cells that are rapidly growing, such as cancer cells and cells in your mouth and stomach. It is usually used to treat lung cancers like non-small cell lung cancer and mesothelioma. It may also be used to treat other cancers. This medicine may be used for other purposes; ask your health care provider or pharmacist if you have questions. COMMON BRAND NAME(S): Alimta What should I tell my health care provider before I take this medicine? They need to know if you have any of these conditions: -if you frequently drink alcohol containing beverages -infection (especially a virus infection such as chickenpox, cold sores, or herpes) -kidney disease -liver disease -low blood counts, like low platelets, red bloods, or white blood  cells -an unusual or allergic reaction to pemetrexed, mannitol, other medicines, foods, dyes, or preservatives -pregnant or trying to get pregnant -breast-feeding How should I use this medicine? This drug is given as an infusion into a vein. It is administered in a hospital or clinic by a specially trained health care professional. Talk to your pediatrician regarding the use of this medicine in children. Special care may be needed. Overdosage: If you think you have taken too much of this medicine contact a poison control center or emergency room at once. NOTE: This medicine is only for you. Do not share this medicine with others. What if I miss a dose? It is important not to miss your dose. Call your doctor or health care professional if you are unable to keep an appointment. What may interact with this medicine? -aspirin and aspirin-like medicines -medicines to increase blood counts like filgrastim, pegfilgrastim, sargramostim -methotrexate -NSAIDS, medicines for pain and inflammation, like ibuprofen or naproxen -probenecid -pyrimethamine -vaccines Talk to your doctor or health care professional before taking any of these medicines: -acetaminophen -aspirin -ibuprofen -ketoprofen -naproxen This list may not describe all possible interactions. Give your health care provider a list of all the medicines, herbs, non-prescription drugs, or dietary supplements you use. Also tell them if you smoke, drink alcohol, or use illegal drugs. Some items may interact with your medicine. What should I watch for while using this medicine? Visit your doctor for checks on your progress. This drug may make you feel generally unwell. This is not uncommon, as chemotherapy can affect healthy cells as well as cancer cells. Report any side effects. Continue your course of treatment even though  you feel ill unless your doctor tells you to stop. In some cases, you may be given additional medicines to help with side  effects. Follow all directions for their use. Call your doctor or health care professional for advice if you get a fever, chills or sore throat, or other symptoms of a cold or flu. Do not treat yourself. This drug decreases your body's ability to fight infections. Try to avoid being around people who are sick. This medicine may increase your risk to bruise or bleed. Call your doctor or health care professional if you notice any unusual bleeding. Be careful brushing and flossing your teeth or using a toothpick because you may get an infection or bleed more easily. If you have any dental work done, tell your dentist you are receiving this medicine. Avoid taking products that contain aspirin, acetaminophen, ibuprofen, naproxen, or ketoprofen unless instructed by your doctor. These medicines may hide a fever. Call your doctor or health care professional if you get diarrhea or mouth sores. Do not treat yourself. To protect your kidneys, drink water or other fluids as directed while you are taking this medicine. Men and women must use effective birth control while taking this medicine. You may also need to continue using effective birth control for a time after stopping this medicine. Do not become pregnant while taking this medicine. Tell your doctor right away if you think that you or your partner might be pregnant. There is a potential for serious side effects to an unborn child. Talk to your health care professional or pharmacist for more information. Do not breast-feed an infant while taking this medicine. This medicine may lower sperm counts. What side effects may I notice from receiving this medicine? Side effects that you should report to your doctor or health care professional as soon as possible: -allergic reactions like skin rash, itching or hives, swelling of the face, lips, or tongue -low blood counts - this medicine may decrease the number of white blood cells, red blood cells and platelets. You  may be at increased risk for infections and bleeding. -signs of infection - fever or chills, cough, sore throat, pain or difficulty passing urine -signs of decreased platelets or bleeding - bruising, pinpoint red spots on the skin, black, tarry stools, blood in the urine -signs of decreased red blood cells - unusually weak or tired, fainting spells, lightheadedness -breathing problems, like a dry cough -changes in emotions or moods -chest pain -confusion -diarrhea -high blood pressure -mouth or throat sores or ulcers -pain, swelling, warmth in the leg -pain on swallowing -swelling of the ankles, feet, hands -trouble passing urine or change in the amount of urine -vomiting -yellowing of the eyes or skin Side effects that usually do not require medical attention (report to your doctor or health care professional if they continue or are bothersome): -hair loss -loss of appetite -nausea -stomach upset This list may not describe all possible side effects. Call your doctor for medical advice about side effects. You may report side effects to FDA at 1-800-FDA-1088. Where should I keep my medicine? This drug is given in a hospital or clinic and will not be stored at home. NOTE: This sheet is a summary. It may not cover all possible information. If you have questions about this medicine, talk to your doctor, pharmacist, or health care provider.  2015, Elsevier/Gold Standard. (2008-04-01 13:24:03)  Carboplatin injection What is this medicine? CARBOPLATIN (KAR boe pla tin) is a chemotherapy drug. It targets fast dividing cells,  like cancer cells, and causes these cells to die. This medicine is used to treat ovarian cancer and many other cancers. This medicine may be used for other purposes; ask your health care provider or pharmacist if you have questions. COMMON BRAND NAME(S): Paraplatin What should I tell my health care provider before I take this medicine? They need to know if you have any  of these conditions: -blood disorders -hearing problems -kidney disease -recent or ongoing radiation therapy -an unusual or allergic reaction to carboplatin, cisplatin, other chemotherapy, other medicines, foods, dyes, or preservatives -pregnant or trying to get pregnant -breast-feeding How should I use this medicine? This drug is usually given as an infusion into a vein. It is administered in a hospital or clinic by a specially trained health care professional. Talk to your pediatrician regarding the use of this medicine in children. Special care may be needed. Overdosage: If you think you have taken too much of this medicine contact a poison control center or emergency room at once. NOTE: This medicine is only for you. Do not share this medicine with others. What if I miss a dose? It is important not to miss a dose. Call your doctor or health care professional if you are unable to keep an appointment. What may interact with this medicine? -medicines for seizures -medicines to increase blood counts like filgrastim, pegfilgrastim, sargramostim -some antibiotics like amikacin, gentamicin, neomycin, streptomycin, tobramycin -vaccines Talk to your doctor or health care professional before taking any of these medicines: -acetaminophen -aspirin -ibuprofen -ketoprofen -naproxen This list may not describe all possible interactions. Give your health care provider a list of all the medicines, herbs, non-prescription drugs, or dietary supplements you use. Also tell them if you smoke, drink alcohol, or use illegal drugs. Some items may interact with your medicine. What should I watch for while using this medicine? Your condition will be monitored carefully while you are receiving this medicine. You will need important blood work done while you are taking this medicine. This drug may make you feel generally unwell. This is not uncommon, as chemotherapy can affect healthy cells as well as cancer  cells. Report any side effects. Continue your course of treatment even though you feel ill unless your doctor tells you to stop. In some cases, you may be given additional medicines to help with side effects. Follow all directions for their use. Call your doctor or health care professional for advice if you get a fever, chills or sore throat, or other symptoms of a cold or flu. Do not treat yourself. This drug decreases your body's ability to fight infections. Try to avoid being around people who are sick. This medicine may increase your risk to bruise or bleed. Call your doctor or health care professional if you notice any unusual bleeding. Be careful brushing and flossing your teeth or using a toothpick because you may get an infection or bleed more easily. If you have any dental work done, tell your dentist you are receiving this medicine. Avoid taking products that contain aspirin, acetaminophen, ibuprofen, naproxen, or ketoprofen unless instructed by your doctor. These medicines may hide a fever. Do not become pregnant while taking this medicine. Women should inform their doctor if they wish to become pregnant or think they might be pregnant. There is a potential for serious side effects to an unborn child. Talk to your health care professional or pharmacist for more information. Do not breast-feed an infant while taking this medicine. What side effects may I notice  from receiving this medicine? Side effects that you should report to your doctor or health care professional as soon as possible: -allergic reactions like skin rash, itching or hives, swelling of the face, lips, or tongue -signs of infection - fever or chills, cough, sore throat, pain or difficulty passing urine -signs of decreased platelets or bleeding - bruising, pinpoint red spots on the skin, black, tarry stools, nosebleeds -signs of decreased red blood cells - unusually weak or tired, fainting spells, lightheadedness -breathing  problems -changes in hearing -changes in vision -chest pain -high blood pressure -low blood counts - This drug may decrease the number of white blood cells, red blood cells and platelets. You may be at increased risk for infections and bleeding. -nausea and vomiting -pain, swelling, redness or irritation at the injection site -pain, tingling, numbness in the hands or feet -problems with balance, talking, walking -trouble passing urine or change in the amount of urine Side effects that usually do not require medical attention (report to your doctor or health care professional if they continue or are bothersome): -hair loss -loss of appetite -metallic taste in the mouth or changes in taste This list may not describe all possible side effects. Call your doctor for medical advice about side effects. You may report side effects to FDA at 1-800-FDA-1088. Where should I keep my medicine? This drug is given in a hospital or clinic and will not be stored at home. NOTE: This sheet is a summary. It may not cover all possible information. If you have questions about this medicine, talk to your doctor, pharmacist, or health care provider.  2015, Elsevier/Gold Standard. (2007-12-04 14:38:05)

## 2014-12-12 ENCOUNTER — Telehealth: Payer: Self-pay

## 2014-12-12 ENCOUNTER — Ambulatory Visit (HOSPITAL_BASED_OUTPATIENT_CLINIC_OR_DEPARTMENT_OTHER): Payer: Medicare Other

## 2014-12-12 ENCOUNTER — Ambulatory Visit: Payer: Medicare Other

## 2014-12-12 DIAGNOSIS — C3492 Malignant neoplasm of unspecified part of left bronchus or lung: Secondary | ICD-10-CM

## 2014-12-12 DIAGNOSIS — C7931 Secondary malignant neoplasm of brain: Secondary | ICD-10-CM

## 2014-12-12 DIAGNOSIS — Z5189 Encounter for other specified aftercare: Secondary | ICD-10-CM

## 2014-12-12 MED ORDER — PEGFILGRASTIM INJECTION 6 MG/0.6ML ~~LOC~~
6.0000 mg | PREFILLED_SYRINGE | Freq: Once | SUBCUTANEOUS | Status: AC
  Administered 2014-12-12: 6 mg via SUBCUTANEOUS
  Filled 2014-12-12: qty 0.6

## 2014-12-12 NOTE — Patient Instructions (Signed)
Pegfilgrastim injection What is this medicine? PEGFILGRASTIM (peg fil GRA stim) is a long-acting granulocyte colony-stimulating factor that stimulates the growth of neutrophils, a type of white blood cell important in the body's fight against infection. It is used to reduce the incidence of fever and infection in patients with certain types of cancer who are receiving chemotherapy that affects the bone marrow. This medicine may be used for other purposes; ask your health care provider or pharmacist if you have questions. COMMON BRAND NAME(S): Neulasta What should I tell my health care provider before I take this medicine? They need to know if you have any of these conditions: -latex allergy -ongoing radiation therapy -sickle cell disease -skin reactions to acrylic adhesives (On-Body Injector only) -an unusual or allergic reaction to pegfilgrastim, filgrastim, other medicines, foods, dyes, or preservatives -pregnant or trying to get pregnant -breast-feeding How should I use this medicine? This medicine is for injection under the skin. If you get this medicine at home, you will be taught how to prepare and give the pre-filled syringe or how to use the On-body Injector. Refer to the patient Instructions for Use for detailed instructions. Use exactly as directed. Take your medicine at regular intervals. Do not take your medicine more often than directed. It is important that you put your used needles and syringes in a special sharps container. Do not put them in a trash can. If you do not have a sharps container, call your pharmacist or healthcare provider to get one. Talk to your pediatrician regarding the use of this medicine in children. Special care may be needed. Overdosage: If you think you have taken too much of this medicine contact a poison control center or emergency room at once. NOTE: This medicine is only for you. Do not share this medicine with others. What if I miss a dose? It is  important not to miss your dose. Call your doctor or health care professional if you miss your dose. If you miss a dose due to an On-body Injector failure or leakage, a new dose should be administered as soon as possible using a single prefilled syringe for manual use. What may interact with this medicine? Interactions have not been studied. Give your health care provider a list of all the medicines, herbs, non-prescription drugs, or dietary supplements you use. Also tell them if you smoke, drink alcohol, or use illegal drugs. Some items may interact with your medicine. This list may not describe all possible interactions. Give your health care provider a list of all the medicines, herbs, non-prescription drugs, or dietary supplements you use. Also tell them if you smoke, drink alcohol, or use illegal drugs. Some items may interact with your medicine. What should I watch for while using this medicine? You may need blood work done while you are taking this medicine. If you are going to need a MRI, CT scan, or other procedure, tell your doctor that you are using this medicine (On-Body Injector only). What side effects may I notice from receiving this medicine? Side effects that you should report to your doctor or health care professional as soon as possible: -allergic reactions like skin rash, itching or hives, swelling of the face, lips, or tongue -dizziness -fever -pain, redness, or irritation at site where injected -pinpoint red spots on the skin -shortness of breath or breathing problems -stomach or side pain, or pain at the shoulder -swelling -tiredness -trouble passing urine Side effects that usually do not require medical attention (report to your doctor   or health care professional if they continue or are bothersome): -bone pain -muscle pain This list may not describe all possible side effects. Call your doctor for medical advice about side effects. You may report side effects to FDA at  1-800-FDA-1088. Where should I keep my medicine? Keep out of the reach of children. Store pre-filled syringes in a refrigerator between 2 and 8 degrees C (36 and 46 degrees F). Do not freeze. Keep in carton to protect from light. Throw away this medicine if it is left out of the refrigerator for more than 48 hours. Throw away any unused medicine after the expiration date. NOTE: This sheet is a summary. It may not cover all possible information. If you have questions about this medicine, talk to your doctor, pharmacist, or health care provider.  2015, [employer]vier/Gold Standard. (2013-11-28 16:14:05)  

## 2014-12-12 NOTE — Telephone Encounter (Signed)
Called pt re chemo f/u. She is able to eat and drink and move bowels and bladder. She did have question about emla cream. Will call pharmacy to verify order received. Asked about albuterol refill and told her to call her PCP. She asked about a home health evaluation that was supposed to take place. This was ordered by ED on 3/31. S/w [facility] re EMLA. S/w Erasmo Downer at [facility] re: pt will be at [facility] today at 1345 for injection so won't be at home around that time for home visit.

## 2014-12-12 NOTE — Telephone Encounter (Signed)
-----   Message from Cora Collum, RN sent at 12/11/2014 12:13 PM EDT ----- Regarding: Chemo Follow up Call Contact: [phone] 1st time [facility] and [facility]

## 2014-12-17 ENCOUNTER — Ambulatory Visit: Payer: Medicare Other

## 2014-12-17 LAB — GUARDANT 360

## 2014-12-18 ENCOUNTER — Ambulatory Visit (HOSPITAL_BASED_OUTPATIENT_CLINIC_OR_DEPARTMENT_OTHER): Payer: Medicare Other | Admitting: Hematology and Oncology

## 2014-12-18 ENCOUNTER — Encounter: Payer: Self-pay | Admitting: Hematology and Oncology

## 2014-12-18 ENCOUNTER — Telehealth: Payer: Self-pay | Admitting: Hematology and Oncology

## 2014-12-18 ENCOUNTER — Ambulatory Visit: Payer: Medicare Other

## 2014-12-18 VITALS — BP 110/46 | HR 103 | Temp 98.3°F | Resp 18 | Ht 65.0 in | Wt 276.1 lb

## 2014-12-18 DIAGNOSIS — C7931 Secondary malignant neoplasm of brain: Secondary | ICD-10-CM | POA: Diagnosis not present

## 2014-12-18 DIAGNOSIS — J449 Chronic obstructive pulmonary disease, unspecified: Secondary | ICD-10-CM | POA: Diagnosis not present

## 2014-12-18 DIAGNOSIS — C3490 Malignant neoplasm of unspecified part of unspecified bronchus or lung: Secondary | ICD-10-CM | POA: Diagnosis not present

## 2014-12-18 DIAGNOSIS — J438 Other emphysema: Secondary | ICD-10-CM

## 2014-12-18 DIAGNOSIS — R29898 Other symptoms and signs involving the musculoskeletal system: Secondary | ICD-10-CM | POA: Diagnosis not present

## 2014-12-18 NOTE — Progress Notes (Signed)
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   11/05/2014 Imaging Hypermetabolic mediastinal, left hilar and left neck adenopathy with a mildly hypermetabolic left lower lobe nodule as well as rectal activity suspect benign in the rectal area    11/10/2014 Pathology Results Accession: [id number] US biopsy confimed metastatic adenocarcinoma of lung primary   11/14/2014 - 12/02/2014 Radiation Therapy She received treatment with whole brain radiation.   12/11/2014 -  Chemotherapy she received cycle 1 of carboplatin and Alimta    INTERVAL HISTORY: Please see below for problem oriented charting. She is seen for further assessment. Clinically, she is doing well. She was seen at the emergency department lately due to vertigo, improving. Denies recent seizures or new weakness. She tolerated chemotherapy well.  REVIEW OF SYSTEMS:   Constitutional: Denies fevers, chills or abnormal weight loss Eyes: Denies blurriness of vision Ears, nose, mouth, throat, and face: Denies mucositis or sore throat Respiratory: Denies cough, dyspnea or wheezes Cardiovascular: Denies palpitation, chest discomfort or lower extremity swelling Gastrointestinal:  Denies nausea, heartburn or change in bowel habits Skin: Denies abnormal skin rashes Lymphatics: Denies new lymphadenopathy or easy bruising Neurological:Denies numbness, tingling or new  weaknesses Behavioral/Psych: Mood is stable, no new changes  All other systems were reviewed with the patient and are negative.  I have reviewed the past medical history, past surgical history, social history and family history with the patient and they are unchanged from previous note.  ALLERGIES:  has No Known Allergies.  MEDICATIONS:  Current Outpatient Prescriptions  Medication Sig Dispense Refill  . albuterol (PROVENTIL HFA;VENTOLIN HFA) 108 (90 BASE) MCG/ACT inhaler Inhale 1-2 puffs into the lungs every 4 (four) hours as needed for wheezing or shortness of breath. 2 Inhaler 0  . aspirin 81 MG tablet Take 81 mg by mouth daily.    Marland Kitchen dexamethasone (DECADRON) 4 MG tablet Take 1 tablet (4 mg total) by mouth 4 (four) times daily. (Patient taking differently: Take 2 mg by mouth 3 (three) times daily. Per Dr. Sondra Come, dosage decreased to 2 mg three times a day.  Refill called in to [facility] 11/20/14.  Disp. 40 tablets. 0 refills.) 60 tablet 0  . emollient (BIAFINE) cream Apply topically as needed.    . folic acid (FOLVITE) 1 MG tablet Take 1 tablet (1 mg total) by mouth daily. Continue until 21 days after Alimta completed. 100 tablet 3  . levETIRAcetam (KEPPRA) 500 MG tablet TAKE 1 TABLET BY MOUTH TWICE DAILY 60 tablet 2  . lidocaine-prilocaine (EMLA) cream Apply to affected area once 30 g 3  . lisinopril-hydrochlorothiazide (PRINZIDE,ZESTORETIC) 20-25 MG per tablet Take 1 tablet by mouth daily. 30 tablet 6  . meclizine (ANTIVERT) 12.5 MG tablet Take 1 tablet (12.5 mg total) by mouth 3 (three) times daily as needed for dizziness. 30 tablet 0  . Melatonin 10 MG CAPS Take 10 mg by mouth at bedtime as needed (sleep).     Marland Kitchen  nicotine (NICODERM CQ - DOSED IN MG/24 HOURS) 21 mg/24hr patch Place 1 patch (21 mg total) onto the skin daily. 28 patch 0  . ondansetron (ZOFRAN) 8 MG tablet Take 1 tablet (8 mg total) by mouth every 8 (eight) hours as needed. 30 tablet 1  . pantoprazole  (PROTONIX) 40 MG tablet TAKE 1 TABLET BY MOUTH DAILY AT NOON 30 tablet 6  . PARoxetine (PAXIL) 20 MG tablet Take 20 mg by mouth daily as needed (restlessness).     . prochlorperazine (COMPAZINE) 10 MG tablet Take 1 tablet (10 mg total) by mouth every 6 (six) hours as needed (Nausea or vomiting). 30 tablet 1  . simvastatin (ZOCOR) 40 MG tablet Take 40 mg by mouth daily.     No current facility-administered medications for this visit.    PHYSICAL EXAMINATION: ECOG PERFORMANCE STATUS: 1 - Symptomatic but completely ambulatory  Filed Vitals:   12/18/14 1408  BP: 110/46  Pulse: 103  Temp: 98.3 F (36.8 C)  Resp: 18   Filed Weights   12/18/14 1408  Weight: 276 lb 1.6 oz (125.238 kg)    GENERAL:alert, no distress and comfortable. She is obese SKIN: skin color, texture, turgor are normal, no rashes or significant lesions. Noticed skin bruising EYES: normal, Conjunctiva are pink and non-injected, sclera clear OROPHARYNX:no exudate, no erythema and lips, buccal mucosa, and tongue normal  NECK: supple, thyroid normal size, non-tender, without nodularity LYMPH:  no palpable lymphadenopathy in the cervical, axillary or inguinal LUNGS: clear to auscultation and percussion with normal breathing effort HEART: regular rate & rhythm and no murmurs and no lower extremity edema ABDOMEN:abdomen soft, non-tender and normal bowel sounds Musculoskeletal:no cyanosis of digits and no clubbing  NEURO: alert & oriented x 3 with fluent speech, no focal motor/sensory deficits  LABORATORY DATA:  I have reviewed the data as listed    Component Value Date/Time   NA 142 12/09/2014 1204   NA 142 12/04/2014 1556   K 2.9* 12/09/2014 1204   K 3.7 12/04/2014 1556   CL 105 12/09/2014 1204   CO2 25 12/09/2014 1204   CO2 26 12/04/2014 1556   GLUCOSE 83 12/09/2014 1204   GLUCOSE 117 12/04/2014 1556   BUN 17 12/09/2014 1204   BUN 19.7 12/04/2014 1556   CREATININE 0.78 12/09/2014 1204   CREATININE 0.8  12/04/2014 1556   CALCIUM 9.1 12/09/2014 1204   CALCIUM 9.2 12/04/2014 1556   PROT 6.5 12/09/2014 1204   PROT 6.4 12/04/2014 1556   ALBUMIN 3.5 12/09/2014 1204   ALBUMIN 3.3* 12/04/2014 1556   AST 18 12/09/2014 1204   AST 12 12/04/2014 1556   ALT 28 12/09/2014 1204   ALT 33 12/04/2014 1556   ALKPHOS 61 12/09/2014 1204   ALKPHOS 69 12/04/2014 1556   BILITOT 0.5 12/09/2014 1204   BILITOT 0.39 12/04/2014 1556   GFRNONAA 84* 12/09/2014 1204   GFRAA >90 12/09/2014 1204    No results found for: SPEP, UPEP  Lab Results  Component Value Date   WBC 4.8 12/09/2014   NEUTROABS 3.1 12/09/2014   HGB 15.1* 12/09/2014   HCT 46.5* 12/09/2014   MCV 91.9 12/09/2014   PLT 143* 12/09/2014      Chemistry      Component Value Date/Time   NA 142 12/09/2014 1204   NA 142 12/04/2014 1556   K 2.9* 12/09/2014 1204   K 3.7 12/04/2014 1556   CL 105 12/09/2014 1204   CO2 25 12/09/2014 1204   CO2 26 12/04/2014 1556  BUN 17 12/09/2014 1204   BUN 19.7 12/04/2014 1556   CREATININE 0.78 12/09/2014 1204   CREATININE 0.8 12/04/2014 1556      Component Value Date/Time   CALCIUM 9.1 12/09/2014 1204   CALCIUM 9.2 12/04/2014 1556   ALKPHOS 61 12/09/2014 1204   ALKPHOS 69 12/04/2014 1556   AST 18 12/09/2014 1204   AST 12 12/04/2014 1556   ALT 28 12/09/2014 1204   ALT 33 12/04/2014 1556   BILITOT 0.5 12/09/2014 1204   BILITOT 0.39 12/04/2014 1556    ASSESSMENT & PLAN:  Metastatic adenocarcinoma to brain Clinically, she tolerated chemotherapy well. Continue supportive care. My plan will be to give minimum 3 cycles of treatment before repeat PET/CT scan to assess response to treatment.   Metastatic cancer to brain She tolerated radiation well up off from some memory loss. She is going on dexamethasone taper. I plan to repeat MRI of the brain next month.   COPD (chronic obstructive pulmonary disease) She has chronic shortness of breath from COPD. this has improved. The patient had quit  smoking. She will continue to use inhaler as needed.     Left arm weakness She has occasional twitching of her left arm. She will continue Keppra for seizure prophylaxis. She will continue on physical therapy.    No orders of the defined types were placed in this encounter.   All questions were answered. The patient knows to call the clinic with any problems, questions or concerns. No barriers to learning was detected. I spent 15 minutes counseling the patient face to face. The total time spent in the appointment was 20 minutes and more than 50% was on counseling and review of test results     [facility], [place], MD 12/18/2014 2:33 PM

## 2014-12-18 NOTE — Assessment & Plan Note (Signed)
She has chronic shortness of breath from COPD. this has improved. The patient had quit smoking. She will continue to use inhaler as needed.

## 2014-12-18 NOTE — Telephone Encounter (Signed)
gave and printed appt sched and avs fo rpt for April .Marland KitchenMarland KitchenMarland Kitchen

## 2014-12-18 NOTE — Assessment & Plan Note (Signed)
She tolerated radiation well up off from some memory loss. She is going on dexamethasone taper. I plan to repeat MRI of the brain next month.

## 2014-12-18 NOTE — Assessment & Plan Note (Signed)
She has occasional twitching of her left arm. She will continue Keppra for seizure prophylaxis. She will continue on physical therapy.

## 2014-12-18 NOTE — Assessment & Plan Note (Signed)
Clinically, she tolerated chemotherapy well. Continue supportive care. My plan will be to give minimum 3 cycles of treatment before repeat PET/CT scan to assess response to treatment.

## 2014-12-19 ENCOUNTER — Ambulatory Visit: Payer: Medicare Other

## 2014-12-19 ENCOUNTER — Telehealth: Payer: Self-pay | Admitting: *Deleted

## 2014-12-19 NOTE — Telephone Encounter (Signed)
Physical therapist from [facility] called asking about decadron rx if patient should continue or not, has run out , informed him Dr. Sondra Come out of office today  and that Varney Baas would give her a call by the end of the day with status 2:15 PM

## 2014-12-20 ENCOUNTER — Ambulatory Visit: Payer: Medicare Other

## 2014-12-24 ENCOUNTER — Encounter: Payer: Self-pay | Admitting: Radiation Oncology

## 2014-12-24 ENCOUNTER — Telehealth: Payer: Self-pay | Admitting: Oncology

## 2014-12-24 NOTE — Progress Notes (Signed)
  Radiation Oncology         [phone] ________________________________  Name: Caitlin Carter MRN: [id number]  Date: 12/24/2014  DOB: [birth year]  End of Treatment Note  Diagnosis:   Stage IV adenocarcinoma of the lung     Indication for treatment:  Brain metastasis       Radiation treatment dates:   March 3 through March 22  Site/dose:   Whole brain, 35 gray in 14 fractions  Beams/energy:   Lateral fields, 6 MV photons  Narrative: The patient tolerated radiation treatment relatively well.   She denies any significant nausea or headaches during the course of treatment.  The patient was given a Decadron taper at the completion of her therapy  Plan: The patient has completed radiation treatment. The patient will return to radiation oncology clinic for routine followup in one month. I advised them to call or return sooner if they have any questions or concerns related to their recovery or treatment.  -----------------------------------  Blair Promise, PhD, MD

## 2014-12-24 NOTE — Telephone Encounter (Signed)
Received a call from [place], [place] with [facility].  She said that Caitlin Carter is out of decadron and is wanting a refill.  She has been taking 2 mg twice a day.  Advised her that Dr. Sondra Come had given her a taper on 12/02/14.  Caitlin Carter was not aware of the taper.  Called her cousin, Caitlin Carter, who is taking care of the patient.  She was aware of the taper but was not sure how [name] was taking the decadron as [place] handles her medications herself.  She does know that she has been out of the decadron since last week.  Advised her that Dr. Sondra Come will be consulted and we will call her back.

## 2014-12-24 NOTE — Telephone Encounter (Signed)
[facility] and let her know that per Dr. Sondra Come, she does not need to restart taking decadron as long as she is not having headaches or nausea.  Caitlin Carter verbalized agreement and said she is not having any nausea and only occasional mild headaches.  Reminded her of her follow up with Dr. Sondra Come next Thursday.  Caitlin Carter was aware of the appointment.  Also called Donita, RN with [facility] and left her a message advising that Caitlin Carter does not need to take decadron.

## 2015-01-01 ENCOUNTER — Ambulatory Visit: Payer: Medicare Other

## 2015-01-01 ENCOUNTER — Encounter: Payer: Self-pay | Admitting: Oncology

## 2015-01-01 ENCOUNTER — Ambulatory Visit (HOSPITAL_BASED_OUTPATIENT_CLINIC_OR_DEPARTMENT_OTHER): Payer: Medicare Other | Admitting: Hematology and Oncology

## 2015-01-01 ENCOUNTER — Ambulatory Visit (HOSPITAL_BASED_OUTPATIENT_CLINIC_OR_DEPARTMENT_OTHER): Payer: Medicare Other

## 2015-01-01 ENCOUNTER — Encounter: Payer: Self-pay | Admitting: Hematology and Oncology

## 2015-01-01 ENCOUNTER — Ambulatory Visit
Admission: RE | Admit: 2015-01-01 | Discharge: 2015-01-01 | Disposition: A | Discharge status: Home / Self Care | Payer: Medicare Other | Source: Ambulatory Visit | Attending: Radiation Oncology | Admitting: Radiation Oncology

## 2015-01-01 ENCOUNTER — Telehealth: Payer: Self-pay | Admitting: Hematology and Oncology

## 2015-01-01 ENCOUNTER — Other Ambulatory Visit (HOSPITAL_BASED_OUTPATIENT_CLINIC_OR_DEPARTMENT_OTHER): Payer: Medicare Other

## 2015-01-01 VITALS — BP 141/75 | HR 89 | Temp 98.5°F | Resp 20 | Ht 65.0 in | Wt 273.4 lb

## 2015-01-01 VITALS — BP 112/61 | HR 69 | Temp 99.1°F | Resp 18

## 2015-01-01 DIAGNOSIS — R5382 Chronic fatigue, unspecified: Secondary | ICD-10-CM | POA: Insufficient documentation

## 2015-01-01 DIAGNOSIS — C3490 Malignant neoplasm of unspecified part of unspecified bronchus or lung: Secondary | ICD-10-CM | POA: Diagnosis not present

## 2015-01-01 DIAGNOSIS — Z5111 Encounter for antineoplastic chemotherapy: Secondary | ICD-10-CM | POA: Diagnosis not present

## 2015-01-01 DIAGNOSIS — C7931 Secondary malignant neoplasm of brain: Secondary | ICD-10-CM

## 2015-01-01 DIAGNOSIS — Z95828 Presence of other vascular implants and grafts: Secondary | ICD-10-CM

## 2015-01-01 DIAGNOSIS — R29898 Other symptoms and signs involving the musculoskeletal system: Secondary | ICD-10-CM

## 2015-01-01 DIAGNOSIS — E876 Hypokalemia: Secondary | ICD-10-CM | POA: Diagnosis not present

## 2015-01-01 HISTORY — DX: Reserved for concepts with insufficient information to code with codable children: IMO0002

## 2015-01-01 HISTORY — DX: Reserved for inherently not codable concepts without codable children: IMO0001

## 2015-01-01 LAB — COMPREHENSIVE METABOLIC PANEL (CC13)
ALT: 19 U/L (ref 0–55)
AST: 12 U/L (ref 5–34)
Albumin: 3.2 g/dL — ABNORMAL LOW (ref 3.5–5.0)
Alkaline Phosphatase: 74 U/L (ref 40–150)
Anion Gap: 15 meq/L — ABNORMAL HIGH (ref 3–11)
BUN: 12.8 mg/dL (ref 7.0–26.0)
CO2: 23 meq/L (ref 22–29)
Calcium: 9.6 mg/dL (ref 8.4–10.4)
Chloride: 104 meq/L (ref 98–109)
Creatinine: 0.7 mg/dL (ref 0.6–1.1)
EGFR: 87 ml/min/1.73 m2 — ABNORMAL LOW
Glucose: 110 mg/dL (ref 70–140)
Potassium: 3 meq/L — CL (ref 3.5–5.1)
Sodium: 142 meq/L (ref 136–145)
Total Bilirubin: 0.22 mg/dL (ref 0.20–1.20)
Total Protein: 6.5 g/dL (ref 6.4–8.3)

## 2015-01-01 LAB — CBC WITH DIFFERENTIAL/PLATELET
BASO%: 0.9 % (ref 0.0–2.0)
Basophils Absolute: 0.1 10*3/uL (ref 0.0–0.1)
EOS%: 0.2 % (ref 0.0–7.0)
Eosinophils Absolute: 0 10*3/uL (ref 0.0–0.5)
HCT: 39.5 % (ref 34.8–46.6)
HGB: 12.8 g/dL (ref 11.6–15.9)
LYMPH%: 16.1 % (ref 14.0–49.7)
MCH: 28.8 pg (ref 25.1–34.0)
MCHC: 32.4 g/dL (ref 31.5–36.0)
MCV: 88.9 fL (ref 79.5–101.0)
MONO#: 0.7 10*3/uL (ref 0.1–0.9)
MONO%: 8.5 % (ref 0.0–14.0)
NEUT#: 5.7 10*3/uL (ref 1.5–6.5)
NEUT%: 74.3 % (ref 38.4–76.8)
Platelets: 222 10*3/uL (ref 145–400)
RBC: 4.44 10*6/uL (ref 3.70–5.45)
RDW: 17.4 % — ABNORMAL HIGH (ref 11.2–14.5)
WBC: 7.7 10*3/uL (ref 3.9–10.3)
lymph#: 1.2 10*3/uL (ref 0.9–3.3)

## 2015-01-01 MED ORDER — SODIUM CHLORIDE 0.9 % IV SOLN
Freq: Once | INTRAVENOUS | Status: AC
  Administered 2015-01-01: 12:00:00 via INTRAVENOUS

## 2015-01-01 MED ORDER — CARBOPLATIN CHEMO INJECTION 600 MG/60ML
667.5000 mg | Freq: Once | INTRAVENOUS | Status: AC
  Administered 2015-01-01: 670 mg via INTRAVENOUS
  Filled 2015-01-01: qty 67

## 2015-01-01 MED ORDER — SODIUM CHLORIDE 0.9 % IJ SOLN
10.0000 mL | INTRAMUSCULAR | Status: DC | PRN
  Administered 2015-01-01: 10 mL
  Filled 2015-01-01: qty 10

## 2015-01-01 MED ORDER — HEPARIN SOD (PORK) LOCK FLUSH 100 UNIT/ML IV SOLN
500.0000 [IU] | Freq: Once | INTRAVENOUS | Status: AC | PRN
  Administered 2015-01-01: 500 [IU]
  Filled 2015-01-01: qty 5

## 2015-01-01 MED ORDER — SODIUM CHLORIDE 0.9 % IJ SOLN
10.0000 mL | INTRAMUSCULAR | Status: DC | PRN
  Administered 2015-01-01: 10 mL via INTRAVENOUS
  Filled 2015-01-01: qty 10

## 2015-01-01 MED ORDER — PEMETREXED DISODIUM CHEMO INJECTION 500 MG
500.0000 mg/m2 | Freq: Once | INTRAVENOUS | Status: AC
  Administered 2015-01-01: 1200 mg via INTRAVENOUS
  Filled 2015-01-01: qty 48

## 2015-01-01 MED ORDER — DEXAMETHASONE SODIUM PHOSPHATE 100 MG/10ML IJ SOLN
Freq: Once | INTRAMUSCULAR | Status: AC
  Administered 2015-01-01: 12:00:00 via INTRAVENOUS
  Filled 2015-01-01: qty 8

## 2015-01-01 NOTE — Patient Instructions (Signed)

## 2015-01-01 NOTE — Assessment & Plan Note (Signed)
She has chronic hypokalemia which I think could be related to recent diuretic therapy. Her blood pressure is low. I recommend discontinuation of hydrochlorothiazide/lisinopril. Due to poor tolerance to potassium supplement, I recommended potassium-rich diet instead of oral replacement therapy.

## 2015-01-01 NOTE — Patient Instructions (Signed)
[facility] Discharge Instructions for Patients Receiving Chemotherapy  Today you received the following chemotherapy agents Alimta/Carboplatin To help prevent nausea and vomiting after your treatment, we encourage you to take your nausea medication as prescribed.   If you develop nausea and vomiting that is not controlled by your nausea medication, call the clinic.   BELOW ARE SYMPTOMS THAT SHOULD BE REPORTED IMMEDIATELY:  *FEVER GREATER THAN 100.5 F  *CHILLS WITH OR WITHOUT FEVER  NAUSEA AND VOMITING THAT IS NOT CONTROLLED WITH YOUR NAUSEA MEDICATION  *UNUSUAL SHORTNESS OF BREATH  *UNUSUAL BRUISING OR BLEEDING  TENDERNESS IN MOUTH AND THROAT WITH OR WITHOUT PRESENCE OF ULCERS  *URINARY PROBLEMS  *BOWEL PROBLEMS  UNUSUAL RASH Items with * indicate a potential emergency and should be followed up as soon as possible.  Feel free to call the clinic you have any questions or concerns. The clinic phone number is [phone].  Please show the CHEMO ALERT CARD at check-in to the Emergency Department and triage nurse.  Pemetrexed injection (Alimta) What is this medicine? PEMETREXED (PEM e TREX ed) is a chemotherapy drug. This medicine affects cells that are rapidly growing, such as cancer cells and cells in your mouth and stomach. It is usually used to treat lung cancers like non-small cell lung cancer and mesothelioma. It may also be used to treat other cancers. This medicine may be used for other purposes; ask your health care provider or pharmacist if you have questions. COMMON BRAND NAME(S): Alimta What should I tell my health care provider before I take this medicine? They need to know if you have any of these conditions: -if you frequently drink alcohol containing beverages -infection (especially a virus infection such as chickenpox, cold sores, or herpes) -kidney disease -liver disease -low blood counts, like low platelets, red bloods, or white blood  cells -an unusual or allergic reaction to pemetrexed, mannitol, other medicines, foods, dyes, or preservatives -pregnant or trying to get pregnant -breast-feeding How should I use this medicine? This drug is given as an infusion into a vein. It is administered in a hospital or clinic by a specially trained health care professional. Talk to your pediatrician regarding the use of this medicine in children. Special care may be needed. Overdosage: If you think you have taken too much of this medicine contact a poison control center or emergency room at once. NOTE: This medicine is only for you. Do not share this medicine with others. What if I miss a dose? It is important not to miss your dose. Call your doctor or health care professional if you are unable to keep an appointment. What may interact with this medicine? -aspirin and aspirin-like medicines -medicines to increase blood counts like filgrastim, pegfilgrastim, sargramostim -methotrexate -NSAIDS, medicines for pain and inflammation, like ibuprofen or naproxen -probenecid -pyrimethamine -vaccines Talk to your doctor or health care professional before taking any of these medicines: -acetaminophen -aspirin -ibuprofen -ketoprofen -naproxen This list may not describe all possible interactions. Give your health care provider a list of all the medicines, herbs, non-prescription drugs, or dietary supplements you use. Also tell them if you smoke, drink alcohol, or use illegal drugs. Some items may interact with your medicine. What should I watch for while using this medicine? Visit your doctor for checks on your progress. This drug may make you feel generally unwell. This is not uncommon, as chemotherapy can affect healthy cells as well as cancer cells. Report any side effects. Continue your course of treatment even though   you feel ill unless your doctor tells you to stop. In some cases, you may be given additional medicines to help with side  effects. Follow all directions for their use. Call your doctor or health care professional for advice if you get a fever, chills or sore throat, or other symptoms of a cold or flu. Do not treat yourself. This drug decreases your body's ability to fight infections. Try to avoid being around people who are sick. This medicine may increase your risk to bruise or bleed. Call your doctor or health care professional if you notice any unusual bleeding. Be careful brushing and flossing your teeth or using a toothpick because you may get an infection or bleed more easily. If you have any dental work done, tell your dentist you are receiving this medicine. Avoid taking products that contain aspirin, acetaminophen, ibuprofen, naproxen, or ketoprofen unless instructed by your doctor. These medicines may hide a fever. Call your doctor or health care professional if you get diarrhea or mouth sores. Do not treat yourself. To protect your kidneys, drink water or other fluids as directed while you are taking this medicine. Men and women must use effective birth control while taking this medicine. You may also need to continue using effective birth control for a time after stopping this medicine. Do not become pregnant while taking this medicine. Tell your doctor right away if you think that you or your partner might be pregnant. There is a potential for serious side effects to an unborn child. Talk to your health care professional or pharmacist for more information. Do not breast-feed an infant while taking this medicine. This medicine may lower sperm counts. What side effects may I notice from receiving this medicine? Side effects that you should report to your doctor or health care professional as soon as possible: -allergic reactions like skin rash, itching or hives, swelling of the face, lips, or tongue -low blood counts - this medicine may decrease the number of white blood cells, red blood cells and platelets. You  may be at increased risk for infections and bleeding. -signs of infection - fever or chills, cough, sore throat, pain or difficulty passing urine -signs of decreased platelets or bleeding - bruising, pinpoint red spots on the skin, black, tarry stools, blood in the urine -signs of decreased red blood cells - unusually weak or tired, fainting spells, lightheadedness -breathing problems, like a dry cough -changes in emotions or moods -chest pain -confusion -diarrhea -high blood pressure -mouth or throat sores or ulcers -pain, swelling, warmth in the leg -pain on swallowing -swelling of the ankles, feet, hands -trouble passing urine or change in the amount of urine -vomiting -yellowing of the eyes or skin Side effects that usually do not require medical attention (report to your doctor or health care professional if they continue or are bothersome): -hair loss -loss of appetite -nausea -stomach upset This list may not describe all possible side effects. Call your doctor for medical advice about side effects. You may report side effects to FDA at 1-800-FDA-1088. Where should I keep my medicine? This drug is given in a hospital or clinic and will not be stored at home. NOTE: This sheet is a summary. It may not cover all possible information. If you have questions about this medicine, talk to your doctor, pharmacist, or health care provider.  2015, Elsevier/Gold Standard. (2008-04-01 13:24:03)  Carboplatin injection What is this medicine? CARBOPLATIN (KAR boe pla tin) is a chemotherapy drug. It targets fast dividing cells,   like cancer cells, and causes these cells to die. This medicine is used to treat ovarian cancer and many other cancers. This medicine may be used for other purposes; ask your health care provider or pharmacist if you have questions. COMMON BRAND NAME(S): Paraplatin What should I tell my health care provider before I take this medicine? They need to know if you have any  of these conditions: -blood disorders -hearing problems -kidney disease -recent or ongoing radiation therapy -an unusual or allergic reaction to carboplatin, cisplatin, other chemotherapy, other medicines, foods, dyes, or preservatives -pregnant or trying to get pregnant -breast-feeding How should I use this medicine? This drug is usually given as an infusion into a vein. It is administered in a hospital or clinic by a specially trained health care professional. Talk to your pediatrician regarding the use of this medicine in children. Special care may be needed. Overdosage: If you think you have taken too much of this medicine contact a poison control center or emergency room at once. NOTE: This medicine is only for you. Do not share this medicine with others. What if I miss a dose? It is important not to miss a dose. Call your doctor or health care professional if you are unable to keep an appointment. What may interact with this medicine? -medicines for seizures -medicines to increase blood counts like filgrastim, pegfilgrastim, sargramostim -some antibiotics like amikacin, gentamicin, neomycin, streptomycin, tobramycin -vaccines Talk to your doctor or health care professional before taking any of these medicines: -acetaminophen -aspirin -ibuprofen -ketoprofen -naproxen This list may not describe all possible interactions. Give your health care provider a list of all the medicines, herbs, non-prescription drugs, or dietary supplements you use. Also tell them if you smoke, drink alcohol, or use illegal drugs. Some items may interact with your medicine. What should I watch for while using this medicine? Your condition will be monitored carefully while you are receiving this medicine. You will need important blood work done while you are taking this medicine. This drug may make you feel generally unwell. This is not uncommon, as chemotherapy can affect healthy cells as well as cancer  cells. Report any side effects. Continue your course of treatment even though you feel ill unless your doctor tells you to stop. In some cases, you may be given additional medicines to help with side effects. Follow all directions for their use. Call your doctor or health care professional for advice if you get a fever, chills or sore throat, or other symptoms of a cold or flu. Do not treat yourself. This drug decreases your body's ability to fight infections. Try to avoid being around people who are sick. This medicine may increase your risk to bruise or bleed. Call your doctor or health care professional if you notice any unusual bleeding. Be careful brushing and flossing your teeth or using a toothpick because you may get an infection or bleed more easily. If you have any dental work done, tell your dentist you are receiving this medicine. Avoid taking products that contain aspirin, acetaminophen, ibuprofen, naproxen, or ketoprofen unless instructed by your doctor. These medicines may hide a fever. Do not become pregnant while taking this medicine. Women should inform their doctor if they wish to become pregnant or think they might be pregnant. There is a potential for serious side effects to an unborn child. Talk to your health care professional or pharmacist for more information. Do not breast-feed an infant while taking this medicine. What side effects may I notice   from receiving this medicine? Side effects that you should report to your doctor or health care professional as soon as possible: -allergic reactions like skin rash, itching or hives, swelling of the face, lips, or tongue -signs of infection - fever or chills, cough, sore throat, pain or difficulty passing urine -signs of decreased platelets or bleeding - bruising, pinpoint red spots on the skin, black, tarry stools, nosebleeds -signs of decreased red blood cells - unusually weak or tired, fainting spells, lightheadedness -breathing  problems -changes in hearing -changes in vision -chest pain -high blood pressure -low blood counts - This drug may decrease the number of white blood cells, red blood cells and platelets. You may be at increased risk for infections and bleeding. -nausea and vomiting -pain, swelling, redness or irritation at the injection site -pain, tingling, numbness in the hands or feet -problems with balance, talking, walking -trouble passing urine or change in the amount of urine Side effects that usually do not require medical attention (report to your doctor or health care professional if they continue or are bothersome): -hair loss -loss of appetite -metallic taste in the mouth or changes in taste This list may not describe all possible side effects. Call your doctor for medical advice about side effects. You may report side effects to FDA at 1-800-FDA-1088. Where should I keep my medicine? This drug is given in a hospital or clinic and will not be stored at home. NOTE: This sheet is a summary. It may not cover all possible information. If you have questions about this medicine, talk to your doctor, pharmacist, or health care provider.  2015, Elsevier/Gold Standard. (2007-12-04 14:38:05)  

## 2015-01-01 NOTE — Assessment & Plan Note (Signed)
She has chronic shortness of breath from COPD. this has improved. The patient had quit smoking. She will continue to use inhaler as needed.   

## 2015-01-01 NOTE — Progress Notes (Addendum)
Andrey Cota here for follow up after treatment to her whole brain.  She denies pain.  She reports having nausea this morning and took medication but can't remember if it was Zofran or compazine.  She denies dizziness.  She reports occasionally feeling "wobbly" when she first stands up.  She denies having headaches.  She reports sometimes that her visions is blurry.  She is wondering if this is from her contact.  She reports feeling week.  She is not taking decadron.  She continues to keppra twice a day.  Her skin is dry on her scalp.  She continues to use biafine.  She reports her appetite is ok.  She is trying to eat healthier.  She has lost 8 lbs since 12/04/14.  She has her second cycle of chemo today.  BP 141/75 mmHg  Pulse 89  Temp(Src) 98.5 F (36.9 C) (Oral)  Resp 20  Ht '5\' 5"'$  (1.651 m)  Wt 273 lb 6.4 oz (124.013 kg)  BMI 45.50 kg/m2  SpO2 96%

## 2015-01-01 NOTE — Assessment & Plan Note (Signed)
This has gradually improved since radiation treatment and steroids. Continue physical therapy as tolerated

## 2015-01-01 NOTE — Progress Notes (Signed)
Radiation Oncology         [phone] ________________________________  Name: Caitlin Carter MRN: [id number]  Date: 01/01/2015  DOB: 12-Jun-1946  Follow-Up Visit Note  CC: FULP, CAMMIE, MD  Heath Lark, MD  Diagnosis:  Stage IV adenocarcinoma of the lung    Interval Since Last Radiation:  December 02, 2014 (4 weeks and 2 days)  Narrative:  The patient returns today for routine follow-up.  Never had nausea until this morning. No headaches. Change in vision (shadowy vision). Pt states she gets tired and sob. Has difficulty walking and keeping her balance. Started 2nd round of chemo today.                      ALLERGIES:  has No Known Allergies.  Meds: Current Outpatient Prescriptions  Medication Sig Dispense Refill  . albuterol (PROVENTIL HFA;VENTOLIN HFA) 108 (90 BASE) MCG/ACT inhaler Inhale 1-2 puffs into the lungs every 4 (four) hours as needed for wheezing or shortness of breath. 2 Inhaler 0  . aspirin 81 MG tablet Take 81 mg by mouth daily.    Marland Kitchen emollient (BIAFINE) cream Apply topically as needed.    . folic acid (FOLVITE) 1 MG tablet Take 1 tablet (1 mg total) by mouth daily. Continue until 21 days after Alimta completed. 100 tablet 3  . levETIRAcetam (KEPPRA) 500 MG tablet TAKE 1 TABLET BY MOUTH TWICE DAILY 60 tablet 2  . lidocaine-prilocaine (EMLA) cream Apply to affected area once 30 g 3  . lisinopril-hydrochlorothiazide (PRINZIDE,ZESTORETIC) 20-25 MG per tablet Take 1 tablet by mouth daily. 30 tablet 6  . Melatonin 10 MG CAPS Take 10 mg by mouth at bedtime as needed (sleep).     . nicotine (NICODERM CQ - DOSED IN MG/24 HOURS) 21 mg/24hr patch Place 1 patch (21 mg total) onto the skin daily. 28 patch 0  . ondansetron (ZOFRAN) 8 MG tablet Take 1 tablet (8 mg total) by mouth every 8 (eight) hours as needed. 30 tablet 1  . pantoprazole (PROTONIX) 40 MG tablet TAKE 1 TABLET BY MOUTH DAILY AT NOON 30 tablet 6  . PARoxetine (PAXIL) 20 MG tablet Take 20 mg by mouth daily as  needed (restlessness).     . prochlorperazine (COMPAZINE) 10 MG tablet Take 1 tablet (10 mg total) by mouth every 6 (six) hours as needed (Nausea or vomiting). 30 tablet 1  . simvastatin (ZOCOR) 40 MG tablet Take 40 mg by mouth daily.    Marland Kitchen dexamethasone (DECADRON) 4 MG tablet Take 1 tablet (4 mg total) by mouth 4 (four) times daily. (Patient not taking: Reported on 01/01/2015) 60 tablet 0  . meclizine (ANTIVERT) 12.5 MG tablet Take 1 tablet (12.5 mg total) by mouth 3 (three) times daily as needed for dizziness. (Patient not taking: Reported on 01/01/2015) 30 tablet 0   No current facility-administered medications for this encounter.    Physical Findings: The patient is in no acute distress. Patient is alert and oriented. Quite pleasant accompanied by her sister  height is '5\' 5"'$  (1.651 m) and weight is 273 lb 6.4 oz (124.013 kg). Her oral temperature is 98.5 F (36.9 C). Her blood pressure is 141/75 and her pulse is 89. Her respiration is 20 and oxygen saturation is 96%. .  Lungs are clear. Heart has regular r/r. No palpable adenopathy in the neck and supraclavicular regions. No axillary adenopathy.. Oral pharynx is clear, no evidence of infection. Extraocular eye movement is intact. Slightly weak in the left leg.  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 Low-grade fever. Recent port placement.  EXAM: CHEST  2 VIEW  COMPARISON:  Chest radiograph 10/23/2014.  FINDINGS: Cardiomegaly. Mild vascular congestion without focal infiltrates or CHF. Mediastinal adenopathy poorly visualized on this examination, better observed on CT. Port-A-Cath tip lies in the mid SVC. There is no pneumothorax.  IMPRESSION: Cardiomegaly. Mild vascular congestion. Port-A-Cath  appears unremarkable. No pneumothorax.   Electronically Signed   By: Rolla Flatten M.D.   On: 12/09/2014 12:56   Ir Fluoro Guide Cv Line Right  12/04/2014   CLINICAL DATA:  Lung adenocarcinoma with brain metastasis, needs long-term access for chemotherapy  EXAM: TUNNELED PORT CATHETER PLACEMENT WITH ULTRASOUND AND FLUOROSCOPIC GUIDANCE  FLUOROSCOPY TIME:  24 seconds, 15 mGy  ANESTHESIA/SEDATION: Intravenous Fentanyl and Versed were administered as conscious sedation during continuous cardiorespiratory monitoring by the radiology RN, with a total moderate sedation time of 18 minutes.  TECHNIQUE: The procedure, risks, benefits, and alternatives were explained to the patient. Questions regarding the procedure were encouraged and answered. The patient understands and consents to the procedure. As antibiotic prophylaxis, cefazolin 3 g was ordered pre-procedure and administered intravenously within one hour of incision. Patency of the right IJ vein was confirmed with ultrasound with image documentation. An appropriate skin site was determined. Skin site was marked. Region was prepped using maximum barrier technique including cap and mask, sterile gown, sterile gloves, large sterile sheet, and Chlorhexidine as cutaneous antisepsis. The region was infiltrated locally with 1% lidocaine. Under real-time ultrasound guidance, the right IJ vein was accessed with a 21 gauge micropuncture needle; the needle tip within the vein was confirmed with ultrasound image documentation. Needle was exchanged over a 018 guidewire for transitional dilator which allowed passage of the [facility] wire into the IVC. Over this, the transitional dilator was exchanged for a 5 [place] MPA catheter. A small incision was made on the right anterior chest wall and a subcutaneous pocket fashioned. The power-injectable port was positioned and its catheter tunneled to the right IJ dermatotomy site. The MPA catheter was exchanged over an Amplatz wire for a  peel-away sheath, through which the port catheter, which had been trimmed to the appropriate length, was advanced and positioned under fluoroscopy with its tip at the cavoatrial junction. Spot chest radiograph confirms good catheter position and no pneumothorax. The pocket was closed with deep interrupted and subcuticular continuous 3-0 Monocryl sutures. The port was flushed per protocol. The incisions were covered with Dermabond then covered with a sterile dressing.  COMPLICATIONS: COMPLICATIONS None immediate  IMPRESSION: Technically successful right IJ power-injectable port catheter placement. Ready for routine use.   Electronically Signed   By: Lucrezia Europe M.D.   On: 12/04/2014 14:48   Ir US Guide Vasc Access Right  12/04/2014   CLINICAL DATA:  Lung adenocarcinoma with brain metastasis, needs long-term access for chemotherapy  EXAM: TUNNELED PORT CATHETER PLACEMENT WITH ULTRASOUND AND FLUOROSCOPIC GUIDANCE  FLUOROSCOPY TIME:  24 seconds, 15 mGy  ANESTHESIA/SEDATION: Intravenous Fentanyl and Versed were administered as conscious sedation during continuous cardiorespiratory monitoring by the radiology RN, with a total moderate sedation time of 18 minutes.  TECHNIQUE: The procedure, risks, benefits, and alternatives were explained to the patient.  Questions regarding the procedure were encouraged and answered. The patient understands and consents to the procedure. As antibiotic prophylaxis, cefazolin 3 g was ordered pre-procedure and administered intravenously within one hour of incision. Patency of the right IJ vein was confirmed with ultrasound with image documentation. An appropriate skin site was determined. Skin site was marked. Region was prepped using maximum barrier technique including cap and mask, sterile gown, sterile gloves, large sterile sheet, and Chlorhexidine as cutaneous antisepsis. The region was infiltrated locally with 1% lidocaine. Under real-time ultrasound guidance, the right IJ vein was  accessed with a 21 gauge micropuncture needle; the needle tip within the vein was confirmed with ultrasound image documentation. Needle was exchanged over a 018 guidewire for transitional dilator which allowed passage of the [facility] wire into the IVC. Over this, the transitional dilator was exchanged for a 5 [place] MPA catheter. A small incision was made on the right anterior chest wall and a subcutaneous pocket fashioned. The power-injectable port was positioned and its catheter tunneled to the right IJ dermatotomy site. The MPA catheter was exchanged over an Amplatz wire for a peel-away sheath, through which the port catheter, which had been trimmed to the appropriate length, was advanced and positioned under fluoroscopy with its tip at the cavoatrial junction. Spot chest radiograph confirms good catheter position and no pneumothorax. The pocket was closed with deep interrupted and subcuticular continuous 3-0 Monocryl sutures. The port was flushed per protocol. The incisions were covered with Dermabond then covered with a sterile dressing.  COMPLICATIONS: COMPLICATIONS None immediate  IMPRESSION: Technically successful right IJ power-injectable port catheter placement. Ready for routine use.   Electronically Signed   By: Lucrezia Europe M.D.   On: 12/04/2014 14:48    Impression:  The patient is recovering from the effects of radiation.  Pt appears to be stable.  Plan:  Pt should continue her chemo treatments and schedule for a follow up in 3 months.  This document serves as a record of services personally performed by Gery Pray, MD. It was created on his behalf by Darcus Austin, a trained medical scribe. The creation of this record is based on the scribe's personal observations and the provider's statements to them. This document has been checked and approved by the attending provider. ____________________________________ Gery Pray, MD

## 2015-01-01 NOTE — Assessment & Plan Note (Signed)
Clinically, she tolerated chemotherapy well. Continue supportive care. My plan will be to give minimum 3 cycles of treatment before repeat PET/CT scan to assess response to treatment. 

## 2015-01-01 NOTE — Progress Notes (Signed)
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   12/11/2014 -  Chemotherapy she received cycle 1 of carboplatin and Alimta    INTERVAL HISTORY: Please see below for problem oriented charting. She is seen today prior to cycle 2 of treatment. She tolerated treatment well and has not experienced side effects until one episode of nausea this morning. She complained of feeling tired and weak. Denies recent seizure or new neurological deficit. Denies recent exacerbation of congestive heart failure or leg edema. She denies recent infection. She has not smoked recently  REVIEW OF SYSTEMS:   Constitutional: Denies fevers, chills or abnormal weight loss Eyes: Denies blurriness of vision Ears, nose, mouth, throat, and face: Denies mucositis or sore throat Respiratory: Denies cough, dyspnea or wheezes Cardiovascular: Denies palpitation, chest discomfort or lower extremity swelling Gastrointestinal:  Denies nausea, heartburn or change in bowel  habits Skin: Denies abnormal skin rashes Lymphatics: Denies new lymphadenopathy or easy bruising Neurological:Denies numbness, tingling or new weaknesses Behavioral/Psych: Mood is stable, no new changes  All other systems were reviewed with the patient and are negative.  I have reviewed the past medical history, past surgical history, social history and family history with the patient and they are unchanged from previous note.  ALLERGIES:  has No Known Allergies.  MEDICATIONS:  Current Outpatient Prescriptions  Medication Sig Dispense Refill  . albuterol (PROVENTIL HFA;VENTOLIN HFA) 108 (90 BASE) MCG/ACT inhaler Inhale 1-2 puffs into the lungs every 4 (four) hours as needed for wheezing or shortness of breath. 2 Inhaler 0  . aspirin 81 MG tablet Take 81 mg by mouth daily.    Marland Kitchen emollient (BIAFINE) cream Apply topically as needed.    . folic acid (FOLVITE) 1 MG tablet Take 1 tablet (1 mg total) by mouth daily. Continue until 21 days after Alimta completed. 100 tablet 3  . levETIRAcetam (KEPPRA) 500 MG tablet TAKE 1 TABLET BY MOUTH TWICE DAILY 60 tablet 2  . lidocaine-prilocaine (EMLA) cream Apply to affected area once 30 g 3  . lisinopril-hydrochlorothiazide (PRINZIDE,ZESTORETIC) 20-25 MG per tablet Take 1 tablet by mouth daily. 30 tablet 6  . meclizine (ANTIVERT) 12.5 MG tablet Take 1 tablet (12.5 mg total) by mouth 3 (three) times daily as needed for dizziness. 30 tablet 0  . Melatonin 10 MG CAPS Take 10 mg by mouth at bedtime as needed (sleep).     . nicotine (NICODERM CQ - DOSED IN MG/24 HOURS) 21 mg/24hr patch Place 1 patch (21 mg total) onto the skin daily. 28 patch 0  . ondansetron (ZOFRAN) 8 MG tablet Take  1 tablet (8 mg total) by mouth every 8 (eight) hours as needed. 30 tablet 1  . pantoprazole (PROTONIX) 40 MG tablet TAKE 1 TABLET BY MOUTH DAILY AT NOON 30 tablet 6  . PARoxetine (PAXIL) 20 MG tablet Take 20 mg by mouth daily as needed (restlessness).     . prochlorperazine  (COMPAZINE) 10 MG tablet Take 1 tablet (10 mg total) by mouth every 6 (six) hours as needed (Nausea or vomiting). 30 tablet 1  . simvastatin (ZOCOR) 40 MG tablet Take 40 mg by mouth daily.     No current facility-administered medications for this visit.   Facility-Administered Medications Ordered in Other Visits  Medication Dose Route Frequency Provider Last Rate Last Dose  . CARBOplatin (PARAPLATIN) 670 mg in sodium chloride 0.9 % 250 mL chemo infusion  670 mg Intravenous Once Heath Lark, MD      . heparin lock flush 100 unit/mL  500 Units Intracatheter Once PRN Heath Lark, MD      . sodium chloride 0.9 % injection 10 mL  10 mL Intracatheter PRN Heath Lark, MD        PHYSICAL EXAMINATION: ECOG PERFORMANCE STATUS: 2 - Symptomatic, <50% confined to bed  Filed Vitals:   01/01/15 1118  BP: 112/61  Pulse: 69  Temp: 99.1 F (37.3 C)  Resp: 18   There were no vitals filed for this visit.  GENERAL:alert, no distress and comfortable. She is morbidly obese and mildly cushingoid  SKIN: skin color, texture, turgor are normal, no rashes or significant lesions EYES: normal, Conjunctiva are pink and non-injected, sclera clear OROPHARYNX:no exudate, no erythema and lips, buccal mucosa, and tongue normal  NECK: supple, thyroid normal size, non-tender, without nodularity LYMPH:  no palpable lymphadenopathy in the cervical, axillary or inguinal LUNGS: clear to auscultation and percussion with normal breathing effort HEART: regular rate & rhythm and no murmurs and no lower extremity edema ABDOMEN:abdomen soft, non-tender and normal bowel sounds Musculoskeletal:no cyanosis of digits and no clubbing  NEURO: alert & oriented x 3 with fluent speech, no focal motor/sensory deficits  LABORATORY DATA:  I have reviewed the data as listed    Component Value Date/Time   NA 142 01/01/2015 0959   NA 142 12/09/2014 1204   K 3.0* 01/01/2015 0959   K 2.9* 12/09/2014 1204   CL 105 12/09/2014 1204   CO2 23  01/01/2015 0959   CO2 25 12/09/2014 1204   GLUCOSE 110 01/01/2015 0959   GLUCOSE 83 12/09/2014 1204   BUN 12.8 01/01/2015 0959   BUN 17 12/09/2014 1204   CREATININE 0.7 01/01/2015 0959   CREATININE 0.78 12/09/2014 1204   CALCIUM 9.6 01/01/2015 0959   CALCIUM 9.1 12/09/2014 1204   PROT 6.5 01/01/2015 0959   PROT 6.5 12/09/2014 1204   ALBUMIN 3.2* 01/01/2015 0959   ALBUMIN 3.5 12/09/2014 1204   AST 12 01/01/2015 0959   AST 18 12/09/2014 1204   ALT 19 01/01/2015 0959   ALT 28 12/09/2014 1204   ALKPHOS 74 01/01/2015 0959   ALKPHOS 61 12/09/2014 1204   BILITOT 0.22 01/01/2015 0959   BILITOT 0.5 12/09/2014 1204   GFRNONAA 84* 12/09/2014 1204   GFRAA >90 12/09/2014 1204    No results found for: SPEP, UPEP  Lab Results  Component Value Date   WBC 7.7 01/01/2015   NEUTROABS 5.7 01/01/2015   HGB 12.8 01/01/2015   HCT 39.5 01/01/2015   MCV 88.9 01/01/2015   PLT 222 01/01/2015      Chemistry  Component Value Date/Time   NA 142 01/01/2015 0959   NA 142 12/09/2014 1204   K 3.0* 01/01/2015 0959   K 2.9* 12/09/2014 1204   CL 105 12/09/2014 1204   CO2 23 01/01/2015 0959   CO2 25 12/09/2014 1204   BUN 12.8 01/01/2015 0959   BUN 17 12/09/2014 1204   CREATININE 0.7 01/01/2015 0959   CREATININE 0.78 12/09/2014 1204      Component Value Date/Time   CALCIUM 9.6 01/01/2015 0959   CALCIUM 9.1 12/09/2014 1204   ALKPHOS 74 01/01/2015 0959   ALKPHOS 61 12/09/2014 1204   AST 12 01/01/2015 0959   AST 18 12/09/2014 1204   ALT 19 01/01/2015 0959   ALT 28 12/09/2014 1204   BILITOT 0.22 01/01/2015 0959   BILITOT 0.5 12/09/2014 1204      ASSESSMENT & PLAN:  Metastatic adenocarcinoma to brain Clinically, she tolerated chemotherapy well. Continue supportive care. My plan will be to give minimum 3 cycles of treatment before repeat PET/CT scan to assess response to treatment.     Chronic fatigue She has profound fatigue and weakness recently which I do not believe is related  to side effects of chemotherapy. I suspect she has a significant element of deconditioning and related to recent radiation. I recommend graduated exercise as tolerated. It could also be related to recent taper of dexamethasone   COPD (chronic obstructive pulmonary disease) She has chronic shortness of breath from COPD. this has improved. The patient had quit smoking. She will continue to use inhaler as needed.       Hypokalemia She has chronic hypokalemia which I think could be related to recent diuretic therapy. Her blood pressure is low. I recommend discontinuation of hydrochlorothiazide/lisinopril. Due to poor tolerance to potassium supplement, I recommended potassium-rich diet instead of oral replacement therapy.   Left arm weakness This has gradually improved since radiation treatment and steroids. Continue physical therapy as tolerated    No orders of the defined types were placed in this encounter.   All questions were answered. The patient knows to call the clinic with any problems, questions or concerns. No barriers to learning was detected. I spent 25 minutes counseling the patient face to face. The total time spent in the appointment was 30 minutes and more than 50% was on counseling and review of test results     [facility], [place], MD 01/01/2015 12:57 PM

## 2015-01-01 NOTE — Telephone Encounter (Signed)
Pt confirmed labs/ov per 04/21 POF, gave pt AVS and Calendar.... KJ , sent msg to add chemo

## 2015-01-01 NOTE — Assessment & Plan Note (Signed)
She has profound fatigue and weakness recently which I do not believe is related to side effects of chemotherapy. I suspect she has a significant element of deconditioning and related to recent radiation. I recommend graduated exercise as tolerated. It could also be related to recent taper of dexamethasone

## 2015-01-02 ENCOUNTER — Telehealth: Payer: Self-pay | Admitting: *Deleted

## 2015-01-02 ENCOUNTER — Ambulatory Visit (HOSPITAL_BASED_OUTPATIENT_CLINIC_OR_DEPARTMENT_OTHER): Payer: Medicare Other

## 2015-01-02 VITALS — BP 130/60 | HR 65 | Temp 99.1°F

## 2015-01-02 DIAGNOSIS — Z5189 Encounter for other specified aftercare: Secondary | ICD-10-CM

## 2015-01-02 DIAGNOSIS — C3490 Malignant neoplasm of unspecified part of unspecified bronchus or lung: Secondary | ICD-10-CM

## 2015-01-02 DIAGNOSIS — C7931 Secondary malignant neoplasm of brain: Secondary | ICD-10-CM

## 2015-01-02 MED ORDER — PEGFILGRASTIM INJECTION 6 MG/0.6ML ~~LOC~~
6.0000 mg | PREFILLED_SYRINGE | Freq: Once | SUBCUTANEOUS | Status: AC
  Administered 2015-01-02: 6 mg via SUBCUTANEOUS
  Filled 2015-01-02: qty 0.6

## 2015-01-02 NOTE — Telephone Encounter (Signed)
Courtesy call from [place] , [place] from [facility] reporting that patient missed an OT appt due injection appt here @ [place] (neulasta)

## 2015-01-07 ENCOUNTER — Ambulatory Visit: Payer: Medicare Other

## 2015-01-08 ENCOUNTER — Ambulatory Visit: Payer: Medicare Other

## 2015-01-09 ENCOUNTER — Telehealth: Payer: Self-pay

## 2015-01-09 ENCOUNTER — Ambulatory Visit: Payer: Medicare Other

## 2015-01-09 NOTE — Telephone Encounter (Signed)
Pt called asking for what date is her treatment in may. Called pt back and LVM with information.

## 2015-01-10 ENCOUNTER — Ambulatory Visit: Payer: Medicare Other

## 2015-01-13 ENCOUNTER — Telehealth: Payer: Self-pay | Admitting: Hematology and Oncology

## 2015-01-13 ENCOUNTER — Telehealth: Payer: Self-pay | Admitting: *Deleted

## 2015-01-13 ENCOUNTER — Encounter: Payer: Self-pay | Admitting: Hematology and Oncology

## 2015-01-13 ENCOUNTER — Ambulatory Visit (HOSPITAL_BASED_OUTPATIENT_CLINIC_OR_DEPARTMENT_OTHER): Payer: Medicare Other | Admitting: Hematology and Oncology

## 2015-01-13 VITALS — BP 144/46 | HR 65 | Temp 98.2°F | Resp 20 | Ht 65.0 in | Wt 289.0 lb

## 2015-01-13 DIAGNOSIS — R110 Nausea: Secondary | ICD-10-CM

## 2015-01-13 DIAGNOSIS — J449 Chronic obstructive pulmonary disease, unspecified: Secondary | ICD-10-CM

## 2015-01-13 DIAGNOSIS — R5382 Chronic fatigue, unspecified: Secondary | ICD-10-CM

## 2015-01-13 DIAGNOSIS — T451X5A Adverse effect of antineoplastic and immunosuppressive drugs, initial encounter: Secondary | ICD-10-CM | POA: Insufficient documentation

## 2015-01-13 DIAGNOSIS — C7931 Secondary malignant neoplasm of brain: Secondary | ICD-10-CM

## 2015-01-13 DIAGNOSIS — J438 Other emphysema: Secondary | ICD-10-CM

## 2015-01-13 DIAGNOSIS — E876 Hypokalemia: Secondary | ICD-10-CM

## 2015-01-13 DIAGNOSIS — C3492 Malignant neoplasm of unspecified part of left bronchus or lung: Secondary | ICD-10-CM

## 2015-01-13 MED ORDER — PREDNISONE 2.5 MG PO TABS: 7.5000 mg | ORAL_TABLET | Freq: Every day | ORAL | Status: DC

## 2015-01-13 NOTE — Assessment & Plan Note (Signed)
It is highly unusual for her to develop nausea with her current therapy. Again, I suspect there is an element of steroid withdrawal especially when she have nausea when she wakes up in the morning. I plan to restart her back on corticosteroid therapy and reassess next week.

## 2015-01-13 NOTE — Assessment & Plan Note (Signed)
Her recent hypokalemia could be a sign of adrenal insufficiency. I recommend potassium rich diet and plan to recheck again next week. Hopefully, when I restart her back on prednisone, that would help with retaining potassium

## 2015-01-13 NOTE — Progress Notes (Signed)
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   12/11/2014 -  Chemotherapy she received cycle 1 of carboplatin and Alimta    INTERVAL HISTORY: Please see below for problem oriented charting. She is seen urgently today because she is not feeling well. She have recurrent nausea and vomiting. She also complained of occasional chills. Denies any recent fever. No dysuria frequency urgency. She complained of shortness of breath at baseline and is difficult for her to participate in rehabilitation. She complained of profound weakness. She denies any headache or new neurological deficit.  REVIEW OF SYSTEMS:   Constitutional: Denies fevers, or abnormal weight loss Eyes: Denies blurriness of vision Ears, nose, mouth, throat, and face: Denies mucositis or sore throat Cardiovascular: Denies palpitation, chest discomfort or lower extremity swelling Skin: Denies abnormal skin rashes Lymphatics: Denies new lymphadenopathy or easy  bruising Neurological:Denies numbness, tingling or new weaknesses Behavioral/Psych: Mood is stable, no new changes  All other systems were reviewed with the patient and are negative.  I have reviewed the past medical history, past surgical history, social history and family history with the patient and they are unchanged from previous note.  ALLERGIES:  has No Known Allergies.  MEDICATIONS:  Current Outpatient Prescriptions  Medication Sig Dispense Refill  . albuterol (PROVENTIL HFA;VENTOLIN HFA) 108 (90 BASE) MCG/ACT inhaler Inhale 1-2 puffs into the lungs every 4 (four) hours as needed for wheezing or shortness of breath. 2 Inhaler 0  . aspirin 81 MG tablet Take 81 mg by mouth daily.    Marland Kitchen emollient (BIAFINE) cream Apply topically as needed.    . folic acid (FOLVITE) 1 MG tablet Take 1 tablet (1 mg total) by mouth daily. Continue until 21 days after Alimta completed. 100 tablet 3  . levETIRAcetam (KEPPRA) 500 MG tablet TAKE 1 TABLET BY MOUTH TWICE DAILY 60 tablet 2  . lidocaine-prilocaine (EMLA) cream Apply to affected area once 30 g 3  . lisinopril-hydrochlorothiazide (PRINZIDE,ZESTORETIC) 20-25 MG per tablet Take 1 tablet by mouth daily. 30 tablet 6  . meclizine (ANTIVERT) 12.5 MG tablet Take 1 tablet (12.5 mg total) by mouth 3 (three) times daily as needed for dizziness. 30 tablet 0  . Melatonin 10 MG CAPS Take 10 mg by mouth at bedtime as needed (sleep).     . nicotine (NICODERM CQ - DOSED IN MG/24 HOURS) 21 mg/24hr patch Place 1 patch (21 mg total) onto the skin daily. 28 patch 0  . ondansetron (ZOFRAN) 8 MG tablet Take 1 tablet (8 mg total) by mouth every 8 (eight) hours as needed. 30 tablet 1  .  pantoprazole (PROTONIX) 40 MG tablet TAKE 1 TABLET BY MOUTH DAILY AT NOON 30 tablet 6  . PARoxetine (PAXIL) 20 MG tablet Take 20 mg by mouth daily as needed (restlessness).     . predniSONE (DELTASONE) 2.5 MG tablet Take 3 tablets (7.5 mg total) by mouth daily with breakfast. 90 tablet 1  .  prochlorperazine (COMPAZINE) 10 MG tablet Take 1 tablet (10 mg total) by mouth every 6 (six) hours as needed (Nausea or vomiting). 30 tablet 1  . simvastatin (ZOCOR) 40 MG tablet Take 40 mg by mouth daily.     No current facility-administered medications for this visit.    PHYSICAL EXAMINATION: ECOG PERFORMANCE STATUS: 2 - Symptomatic, <50% confined to bed  Filed Vitals:   01/13/15 1246  BP: 144/46  Pulse: 65  Temp: 98.2 F (36.8 C)  Resp: 20   Filed Weights   01/13/15 1246  Weight: 289 lb (131.09 kg)    GENERAL:alert, no distress and comfortable. She is morbidly obese and cushingoid. SKIN: skin color, texture, turgor are normal, no rashes or significant lesions EYES: normal, Conjunctiva are pink and non-injected, sclera clear OROPHARYNX:no exudate, no erythema and lips, buccal mucosa, and tongue normal  Musculoskeletal:no cyanosis of digits and no clubbing  NEURO: alert & oriented x 3 with fluent speech, no focal motor/sensory deficits  LABORATORY DATA:  I have reviewed the data as listed    Component Value Date/Time   NA 142 01/01/2015 0959   NA 142 12/09/2014 1204   K 3.0* 01/01/2015 0959   K 2.9* 12/09/2014 1204   CL 105 12/09/2014 1204   CO2 23 01/01/2015 0959   CO2 25 12/09/2014 1204   GLUCOSE 110 01/01/2015 0959   GLUCOSE 83 12/09/2014 1204   BUN 12.8 01/01/2015 0959   BUN 17 12/09/2014 1204   CREATININE 0.7 01/01/2015 0959   CREATININE 0.78 12/09/2014 1204   CALCIUM 9.6 01/01/2015 0959   CALCIUM 9.1 12/09/2014 1204   PROT 6.5 01/01/2015 0959   PROT 6.5 12/09/2014 1204   ALBUMIN 3.2* 01/01/2015 0959   ALBUMIN 3.5 12/09/2014 1204   AST 12 01/01/2015 0959   AST 18 12/09/2014 1204   ALT 19 01/01/2015 0959   ALT 28 12/09/2014 1204   ALKPHOS 74 01/01/2015 0959   ALKPHOS 61 12/09/2014 1204   BILITOT 0.22 01/01/2015 0959   BILITOT 0.5 12/09/2014 1204   GFRNONAA 84* 12/09/2014 1204   GFRAA >90 12/09/2014 1204    No results found for: SPEP, UPEP  Lab  Results  Component Value Date   WBC 7.7 01/01/2015   NEUTROABS 5.7 01/01/2015   HGB 12.8 01/01/2015   HCT 39.5 01/01/2015   MCV 88.9 01/01/2015   PLT 222 01/01/2015      Chemistry      Component Value Date/Time   NA 142 01/01/2015 0959   NA 142 12/09/2014 1204   K 3.0* 01/01/2015 0959   K 2.9* 12/09/2014 1204   CL 105 12/09/2014 1204   CO2 23 01/01/2015 0959   CO2 25 12/09/2014 1204   BUN 12.8 01/01/2015 0959   BUN 17 12/09/2014 1204   CREATININE 0.7 01/01/2015 0959   CREATININE 0.78 12/09/2014 1204      Component Value Date/Time   CALCIUM 9.6 01/01/2015 0959   CALCIUM 9.1 12/09/2014 1204   ALKPHOS 74 01/01/2015 0959   ALKPHOS 61 12/09/2014 1204   AST 12 01/01/2015 0959   AST 18 12/09/2014 1204   ALT 19 01/01/2015 0959   ALT 28 12/09/2014  1204   BILITOT 0.22 01/01/2015 0959   BILITOT 0.5 12/09/2014 1204      ASSESSMENT & PLAN:  Metastatic adenocarcinoma to brain She tolerated radiation well up off from some memory loss. She did not tolerate steroid taper. I plan to repeat MRI of the brain next month.     Chronic fatigue She has profound fatigue and weakness recently which I do not believe is related to side effects of chemotherapy. I suspect she has a significant element of deconditioning and related to recent radiation. I recommend graduated exercise as tolerated. It could also be related to recent taper of dexamethasone She has significant side effects suggestive of steroid withdrawal. Plan to start her back on 5 mg of prednisone in the morning and 2.5 mg in the afternoon. I will reassess again next week.   COPD (chronic obstructive pulmonary disease) She has significant chronic COPD. I felt that steroids might help with her breathing, too. I will reassess next week.   Hypokalemia Her recent hypokalemia could be a sign of adrenal insufficiency. I recommend potassium rich diet and plan to recheck again next week. Hopefully, when I restart her back  on prednisone, that would help with retaining potassium   Chemotherapy-induced nausea It is highly unusual for her to develop nausea with her current therapy. Again, I suspect there is an element of steroid withdrawal especially when she have nausea when she wakes up in the morning. I plan to restart her back on corticosteroid therapy and reassess next week.    No orders of the defined types were placed in this encounter.   All questions were answered. The patient knows to call the clinic with any problems, questions or concerns. No barriers to learning was detected. I spent 25 minutes counseling the patient face to face. The total time spent in the appointment was 30 minutes and more than 50% was on counseling and review of test results     [facility], [place], MD 01/13/2015 3:46 PM

## 2015-01-13 NOTE — Assessment & Plan Note (Signed)
She has significant chronic COPD. I felt that steroids might help with her breathing, too. I will reassess next week.

## 2015-01-13 NOTE — Telephone Encounter (Signed)
Gave avs & calendar for May. °

## 2015-01-13 NOTE — Telephone Encounter (Signed)
Caregiver called to report that patient had chills all day Monday-no fever, more SOB than normal, nauseated, vomited X 2 yesterday.  Will forward to Dr Alvy Bimler for directions.

## 2015-01-13 NOTE — Assessment & Plan Note (Signed)
She has profound fatigue and weakness recently which I do not believe is related to side effects of chemotherapy. I suspect she has a significant element of deconditioning and related to recent radiation. I recommend graduated exercise as tolerated. It could also be related to recent taper of dexamethasone She has significant side effects suggestive of steroid withdrawal. Plan to start her back on 5 mg of prednisone in the morning and 2.5 mg in the afternoon. I will reassess again next week.

## 2015-01-13 NOTE — Telephone Encounter (Signed)
Instructed pt's friend on appt today to see Dr. Alvy Bimler at 12:45 pm.  She verbalized understanding.

## 2015-01-13 NOTE — Assessment & Plan Note (Signed)
She tolerated radiation well up off from some memory loss. She did not tolerate steroid taper. I plan to repeat MRI of the brain next month.

## 2015-01-15 ENCOUNTER — Telehealth: Payer: Self-pay | Admitting: Hematology and Oncology

## 2015-01-15 NOTE — Telephone Encounter (Signed)
S/w pt confirming labs/ov/chemo/inj per 04/21 POF, chemo added by MD and mailed schedule per pt's request. Also sent msg to MD requesting if pt is supposed to have labs on 05/12 and 05/13.  Caitlin Carter

## 2015-01-16 ENCOUNTER — Telehealth: Payer: Self-pay | Admitting: *Deleted

## 2015-01-16 NOTE — Telephone Encounter (Signed)
Darden Dates,  Is there a phone number I can call the Physical therapist back?

## 2015-01-16 NOTE — Telephone Encounter (Signed)
Yes, I agree with extension Any orders I need to sign?

## 2015-01-16 NOTE — Telephone Encounter (Signed)
Shanon Brow, Physical therapist from [facility] called asking for an physical therapy extension for 1x/week for 2 weeks. Message sent to MD and RN.

## 2015-01-19 ENCOUNTER — Telehealth: Payer: Self-pay | Admitting: *Deleted

## 2015-01-19 NOTE — Telephone Encounter (Signed)
TC from Shanon Brow, [place] with [facility] wanting to extend pt's physical therapy an additional 1x week for 2 weeks for gait, stenghthening and endurance. Please advise.

## 2015-01-19 NOTE — Telephone Encounter (Signed)
TC to [facility] with [facility]. Gave him verbal order to extend PT for this patient. No other action needed at this time.

## 2015-01-19 NOTE — Telephone Encounter (Signed)
Pls extend Let me know what I need to do apart from verbal order

## 2015-01-20 ENCOUNTER — Ambulatory Visit: Payer: Medicare Other | Admitting: Cardiology

## 2015-01-22 ENCOUNTER — Telehealth: Payer: Self-pay | Admitting: Hematology and Oncology

## 2015-01-22 ENCOUNTER — Ambulatory Visit: Payer: Medicare Other

## 2015-01-22 ENCOUNTER — Ambulatory Visit (HOSPITAL_BASED_OUTPATIENT_CLINIC_OR_DEPARTMENT_OTHER): Payer: Medicare Other | Admitting: Hematology and Oncology

## 2015-01-22 ENCOUNTER — Other Ambulatory Visit (HOSPITAL_BASED_OUTPATIENT_CLINIC_OR_DEPARTMENT_OTHER): Payer: Medicare Other

## 2015-01-22 ENCOUNTER — Other Ambulatory Visit: Payer: Self-pay | Admitting: Hematology and Oncology

## 2015-01-22 ENCOUNTER — Ambulatory Visit (HOSPITAL_BASED_OUTPATIENT_CLINIC_OR_DEPARTMENT_OTHER): Payer: Medicare Other

## 2015-01-22 ENCOUNTER — Encounter: Payer: Self-pay | Admitting: Hematology and Oncology

## 2015-01-22 VITALS — BP 131/57 | HR 89 | Temp 98.5°F | Resp 18 | Ht 65.0 in | Wt 278.0 lb

## 2015-01-22 DIAGNOSIS — C3492 Malignant neoplasm of unspecified part of left bronchus or lung: Secondary | ICD-10-CM

## 2015-01-22 DIAGNOSIS — Z5111 Encounter for antineoplastic chemotherapy: Secondary | ICD-10-CM | POA: Diagnosis not present

## 2015-01-22 DIAGNOSIS — R110 Nausea: Secondary | ICD-10-CM

## 2015-01-22 DIAGNOSIS — T451X5A Adverse effect of antineoplastic and immunosuppressive drugs, initial encounter: Secondary | ICD-10-CM

## 2015-01-22 DIAGNOSIS — C7931 Secondary malignant neoplasm of brain: Secondary | ICD-10-CM | POA: Diagnosis not present

## 2015-01-22 DIAGNOSIS — Z95828 Presence of other vascular implants and grafts: Secondary | ICD-10-CM

## 2015-01-22 DIAGNOSIS — R5382 Chronic fatigue, unspecified: Secondary | ICD-10-CM

## 2015-01-22 DIAGNOSIS — J438 Other emphysema: Secondary | ICD-10-CM

## 2015-01-22 LAB — CBC WITH DIFFERENTIAL/PLATELET
BASO%: 0.2 % (ref 0.0–2.0)
BASOS ABS: 0 10*3/uL (ref 0.0–0.1)
EOS ABS: 0 10*3/uL (ref 0.0–0.5)
EOS%: 0.1 % (ref 0.0–7.0)
HCT: 41 % (ref 34.8–46.6)
HEMOGLOBIN: 13 g/dL (ref 11.6–15.9)
LYMPH%: 5.8 % — ABNORMAL LOW (ref 14.0–49.7)
MCH: 30.3 pg (ref 25.1–34.0)
MCHC: 31.7 g/dL (ref 31.5–36.0)
MCV: 95.6 fL (ref 79.5–101.0)
MONO#: 1 10*3/uL — ABNORMAL HIGH (ref 0.1–0.9)
MONO%: 8.3 % (ref 0.0–14.0)
NEUT#: 10.3 10*3/uL — ABNORMAL HIGH (ref 1.5–6.5)
NEUT%: 85.6 % — ABNORMAL HIGH (ref 38.4–76.8)
Platelets: 199 10*3/uL (ref 145–400)
RBC: 4.29 10*6/uL (ref 3.70–5.45)
RDW: 19.7 % — AB (ref 11.2–14.5)
WBC: 12.1 10*3/uL — AB (ref 3.9–10.3)
lymph#: 0.7 10*3/uL — ABNORMAL LOW (ref 0.9–3.3)

## 2015-01-22 LAB — COMPREHENSIVE METABOLIC PANEL (CC13)
ALBUMIN: 3.3 g/dL — AB (ref 3.5–5.0)
ALT: 11 U/L (ref 0–55)
ANION GAP: 10 meq/L (ref 3–11)
AST: 10 U/L (ref 5–34)
Alkaline Phosphatase: 79 U/L (ref 40–150)
BUN: 9.4 mg/dL (ref 7.0–26.0)
CALCIUM: 9.2 mg/dL (ref 8.4–10.4)
CHLORIDE: 109 meq/L (ref 98–109)
CO2: 23 meq/L (ref 22–29)
Creatinine: 0.8 mg/dL (ref 0.6–1.1)
EGFR: 81 mL/min/{1.73_m2} — ABNORMAL LOW (ref >90)
GLUCOSE: 101 mg/dL (ref 70–140)
POTASSIUM: 3.9 meq/L (ref 3.5–5.1)
Sodium: 142 mEq/L (ref 136–145)
Total Bilirubin: 0.38 mg/dL (ref 0.20–1.20)
Total Protein: 6.4 g/dL (ref 6.4–8.3)

## 2015-01-22 MED ORDER — SODIUM CHLORIDE 0.9 % IV SOLN
667.5000 mg | Freq: Once | INTRAVENOUS | Status: AC
  Administered 2015-01-22: 670 mg via INTRAVENOUS
  Filled 2015-01-22: qty 67

## 2015-01-22 MED ORDER — SODIUM CHLORIDE 0.9 % IJ SOLN
10.0000 mL | INTRAMUSCULAR | Status: DC | PRN
  Filled 2015-01-22: qty 10

## 2015-01-22 MED ORDER — SODIUM CHLORIDE 0.9 % IJ SOLN
10.0000 mL | INTRAMUSCULAR | Status: DC | PRN
  Administered 2015-01-22: 10 mL
  Filled 2015-01-22: qty 10

## 2015-01-22 MED ORDER — SODIUM CHLORIDE 0.9 % IV SOLN
Freq: Once | INTRAVENOUS | Status: AC
  Administered 2015-01-22: 13:00:00 via INTRAVENOUS
  Filled 2015-01-22: qty 8

## 2015-01-22 MED ORDER — ONDANSETRON HCL 8 MG PO TABS: 8.0000 mg | ORAL_TABLET | Freq: Three times a day (TID) | ORAL | Status: AC | PRN

## 2015-01-22 MED ORDER — CYANOCOBALAMIN 1000 MCG/ML IJ SOLN
1000.0000 ug | Freq: Once | INTRAMUSCULAR | Status: AC
  Administered 2015-01-22: 1000 ug via INTRAMUSCULAR

## 2015-01-22 MED ORDER — HEPARIN SOD (PORK) LOCK FLUSH 100 UNIT/ML IV SOLN
500.0000 [IU] | Freq: Once | INTRAVENOUS | Status: AC | PRN
  Administered 2015-01-22: 500 [IU]
  Filled 2015-01-22: qty 5

## 2015-01-22 MED ORDER — CYANOCOBALAMIN 1000 MCG/ML IJ SOLN
INTRAMUSCULAR | Status: AC
  Filled 2015-01-22: qty 1

## 2015-01-22 MED ORDER — SODIUM CHLORIDE 0.9 % IV SOLN
500.0000 mg/m2 | Freq: Once | INTRAVENOUS | Status: AC
  Administered 2015-01-22: 1200 mg via INTRAVENOUS
  Filled 2015-01-22: qty 48

## 2015-01-22 MED ORDER — SODIUM CHLORIDE 0.9 % IV SOLN
Freq: Once | INTRAVENOUS | Status: AC
  Administered 2015-01-22: 13:00:00 via INTRAVENOUS

## 2015-01-22 NOTE — Telephone Encounter (Signed)
Gave adn printed appt sched and avs for pt for May and JUNE

## 2015-01-22 NOTE — Patient Instructions (Signed)

## 2015-01-22 NOTE — Patient Instructions (Signed)
[facility] Discharge Instructions for Patients Receiving Chemotherapy  Today you received the following chemotherapy agents: Alimta/Carboplatin  To help prevent nausea and vomiting after your treatment, we encourage you to take your nausea medication as directed.   If you develop nausea and vomiting that is not controlled by your nausea medication, call the clinic.   BELOW ARE SYMPTOMS THAT SHOULD BE REPORTED IMMEDIATELY:  *FEVER GREATER THAN 100.5 F  *CHILLS WITH OR WITHOUT FEVER  NAUSEA AND VOMITING THAT IS NOT CONTROLLED WITH YOUR NAUSEA MEDICATION  *UNUSUAL SHORTNESS OF BREATH  *UNUSUAL BRUISING OR BLEEDING  TENDERNESS IN MOUTH AND THROAT WITH OR WITHOUT PRESENCE OF ULCERS  *URINARY PROBLEMS  *BOWEL PROBLEMS  UNUSUAL RASH Items with * indicate a potential emergency and should be followed up as soon as possible.  Feel free to call the clinic you have any questions or concerns. The clinic phone number is [phone].  Please show the CHEMO ALERT CARD at check-in to the Emergency Department and triage nurse.   

## 2015-01-22 NOTE — Progress Notes (Signed)
Patient in for [facility] access, flush, and labs today. Patient states, "I did not put my cream on. Can you draw it from my arm instead?" All labs drawn by [place], Phlebotomist today. Patient's own EMLA cream applied to her PAC site, and the PAC site was covered with a Tegaderm dressing. Patient discharged from the Flush Room.

## 2015-01-23 ENCOUNTER — Other Ambulatory Visit: Payer: Medicare Other

## 2015-01-23 ENCOUNTER — Ambulatory Visit (HOSPITAL_BASED_OUTPATIENT_CLINIC_OR_DEPARTMENT_OTHER): Payer: Medicare Other

## 2015-01-23 VITALS — BP 132/72 | HR 60 | Temp 98.9°F

## 2015-01-23 DIAGNOSIS — C3492 Malignant neoplasm of unspecified part of left bronchus or lung: Secondary | ICD-10-CM

## 2015-01-23 DIAGNOSIS — Z5189 Encounter for other specified aftercare: Secondary | ICD-10-CM

## 2015-01-23 DIAGNOSIS — C7931 Secondary malignant neoplasm of brain: Secondary | ICD-10-CM

## 2015-01-23 MED ORDER — PEGFILGRASTIM INJECTION 6 MG/0.6ML ~~LOC~~
6.0000 mg | PREFILLED_SYRINGE | Freq: Once | SUBCUTANEOUS | Status: AC
  Administered 2015-01-23: 6 mg via SUBCUTANEOUS
  Filled 2015-01-23: qty 0.6

## 2015-01-23 NOTE — Assessment & Plan Note (Signed)
She tolerated radiation well apart from some memory loss. She did not tolerate steroid taper. I will adjust prednisone to 5 mg daily I plan to repeat MRI of the brain next month.

## 2015-01-23 NOTE — Assessment & Plan Note (Signed)
She complained of profound fatigue which could very well be related to side-effects from recent treatment. I recommend we proceed with cycle 3 of therapy and then restage with PET/CT scan and MRI brain to assess response to treatment If next imaging studies show positive response to treatment, I plan to discontinue carboplatin and go on maintenance Alimta

## 2015-01-23 NOTE — Addendum Note (Signed)
Addended by: Robet Leu on: 01/23/2015 02:52 PM   Modules accepted: Medications

## 2015-01-23 NOTE — Assessment & Plan Note (Signed)
Nausea has resolved Continue prednisone therapy and anti-emetics as needed

## 2015-01-23 NOTE — Assessment & Plan Note (Signed)
She has significant chronic COPD. I felt that steroids might help with her breathing, too. Plan to repeat imaging study of her chest

## 2015-01-23 NOTE — Assessment & Plan Note (Signed)
She has profound fatigue and weakness recently which I do not believe is related to side effects of chemotherapy. I suspect she has a significant element of deconditioning and fatigue related to recent radiation. I recommend graduated exercise as tolerated She will remain on low dose prednisone I plan to recheck thyroid function test

## 2015-01-23 NOTE — Progress Notes (Signed)
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   12/11/2014 -  Chemotherapy she received cycle 1 of carboplatin and Alimta    INTERVAL HISTORY: Please see below for problem oriented charting. She is seen prior to cycle 3 of therapy She complained of profound fatigue She denies recent nausea. Appetite is stable Denies recent headaches or seizures  REVIEW OF SYSTEMS:   Constitutional: Denies fevers, chills or abnormal weight loss Eyes: Denies blurriness of vision Ears, nose, mouth, throat, and face: Denies mucositis or sore throat Respiratory: Denies cough, dyspnea or wheezes Cardiovascular: Denies palpitation, chest discomfort or lower extremity swelling Gastrointestinal:  Denies nausea, heartburn or change in bowel habits Skin: Denies abnormal skin rashes Lymphatics: Denies new lymphadenopathy or easy bruising Neurological:Denies numbness, tingling or new weaknesses Behavioral/Psych: Mood is stable, no new changes  All other systems  were reviewed with the patient and are negative.  I have reviewed the past medical history, past surgical history, social history and family history with the patient and they are unchanged from previous note.  ALLERGIES:  has No Known Allergies.  MEDICATIONS:  Current Outpatient Prescriptions  Medication Sig Dispense Refill  . albuterol (PROVENTIL HFA;VENTOLIN HFA) 108 (90 BASE) MCG/ACT inhaler Inhale 1-2 puffs into the lungs every 4 (four) hours as needed for wheezing or shortness of breath. 2 Inhaler 0  . aspirin 81 MG tablet Take 81 mg by mouth daily.    Marland Kitchen emollient (BIAFINE) cream Apply topically as needed.    . folic acid (FOLVITE) 1 MG tablet Take 1 tablet (1 mg total) by mouth daily. Continue until 21 days after Alimta completed. 100 tablet 3  . levETIRAcetam (KEPPRA) 500 MG tablet TAKE 1 TABLET BY MOUTH TWICE DAILY 60 tablet 2  . lidocaine-prilocaine (EMLA) cream Apply to affected area once 30 g 3  . lisinopril-hydrochlorothiazide (PRINZIDE,ZESTORETIC) 20-25 MG per tablet Take 1 tablet by mouth daily. (Patient not taking: Reported on 01/23/2015) 30 tablet 6  . meclizine (ANTIVERT) 12.5 MG tablet Take 1 tablet (12.5 mg total) by mouth 3 (three) times daily as needed for dizziness. 30 tablet 0  . Melatonin 10 MG CAPS Take 10 mg by mouth at bedtime as needed (sleep).     . nicotine (NICODERM CQ - DOSED IN MG/24 HOURS) 21 mg/24hr patch Place 1 patch (21 mg total) onto the skin daily. 28 patch 0  . ondansetron (ZOFRAN) 8 MG tablet Take 1 tablet (8 mg total) by mouth every 8 (eight) hours as needed. 60 tablet 1  . pantoprazole (PROTONIX) 40 MG tablet TAKE 1 TABLET BY MOUTH  DAILY AT NOON 30 tablet 6  . PARoxetine (PAXIL) 20 MG tablet Take 20 mg by mouth daily as needed (restlessness).     . predniSONE (DELTASONE) 2.5 MG tablet Take 3 tablets (7.5 mg total) by mouth daily with breakfast. 90 tablet 1  . prochlorperazine (COMPAZINE) 10 MG tablet Take 1 tablet (10 mg total) by mouth every 6 (six)  hours as needed (Nausea or vomiting). 30 tablet 1  . simvastatin (ZOCOR) 40 MG tablet Take 40 mg by mouth daily.     No current facility-administered medications for this visit.    PHYSICAL EXAMINATION: ECOG PERFORMANCE STATUS: 2 - Symptomatic, <50% confined to bed  Filed Vitals:   01/22/15 1136  BP: 131/57  Pulse: 89  Temp: 98.5 F (36.9 C)  Resp: 18   Filed Weights   01/22/15 1136  Weight: 278 lb (126.1 kg)    GENERAL:alert, no distress and comfortable. She is morbidly obese and mildly Cushingoid SKIN: skin color, texture, turgor are normal, no rashes or significant lesions EYES: normal, Conjunctiva are pink and non-injected, sclera clear OROPHARYNX:no exudate, no erythema and lips, buccal mucosa, and tongue normal  NECK: supple, thyroid normal size, non-tender, without nodularity LYMPH:  no palpable lymphadenopathy in the cervical, axillary or inguinal LUNGS: clear to auscultation and percussion with normal breathing effort HEART: regular rate & rhythm and no murmurs and no lower extremity edema ABDOMEN:abdomen soft, non-tender and normal bowel sounds Musculoskeletal:no cyanosis of digits and no clubbing  NEURO: alert & oriented x 3 with fluent speech, no focal motor/sensory deficits  LABORATORY DATA:  I have reviewed the data as listed    Component Value Date/Time   NA 142 01/22/2015 1116   NA 142 12/09/2014 1204   K 3.9 01/22/2015 1116   K 2.9* 12/09/2014 1204   CL 105 12/09/2014 1204   CO2 23 01/22/2015 1116   CO2 25 12/09/2014 1204   GLUCOSE 101 01/22/2015 1116   GLUCOSE 83 12/09/2014 1204   BUN 9.4 01/22/2015 1116   BUN 17 12/09/2014 1204   CREATININE 0.8 01/22/2015 1116   CREATININE 0.78 12/09/2014 1204   CALCIUM 9.2 01/22/2015 1116   CALCIUM 9.1 12/09/2014 1204   PROT 6.4 01/22/2015 1116   PROT 6.5 12/09/2014 1204   ALBUMIN 3.3* 01/22/2015 1116   ALBUMIN 3.5 12/09/2014 1204   AST 10 01/22/2015 1116   AST 18 12/09/2014 1204   ALT 11 01/22/2015 1116    ALT 28 12/09/2014 1204   ALKPHOS 79 01/22/2015 1116   ALKPHOS 61 12/09/2014 1204   BILITOT 0.38 01/22/2015 1116   BILITOT 0.5 12/09/2014 1204   GFRNONAA 84* 12/09/2014 1204   GFRAA >90 12/09/2014 1204    No results found for: SPEP, UPEP  Lab Results  Component Value Date   WBC 12.1* 01/22/2015   NEUTROABS 10.3* 01/22/2015   HGB 13.0 01/22/2015   HCT 41.0 01/22/2015   MCV 95.6 01/22/2015   PLT 199 01/22/2015      Chemistry      Component Value Date/Time   NA 142 01/22/2015 1116   NA 142 12/09/2014 1204   K 3.9 01/22/2015 1116   K 2.9* 12/09/2014 1204   CL 105 12/09/2014 1204   CO2 23 01/22/2015 1116   CO2 25 12/09/2014 1204   BUN 9.4 01/22/2015 1116   BUN 17 12/09/2014 1204   CREATININE 0.8 01/22/2015 1116   CREATININE 0.78 12/09/2014 1204      Component Value Date/Time   CALCIUM 9.2 01/22/2015 1116  CALCIUM 9.1 12/09/2014 1204   ALKPHOS 79 01/22/2015 1116   ALKPHOS 61 12/09/2014 1204   AST 10 01/22/2015 1116   AST 18 12/09/2014 1204   ALT 11 01/22/2015 1116   ALT 28 12/09/2014 1204   BILITOT 0.38 01/22/2015 1116   BILITOT 0.5 12/09/2014 1204      ASSESSMENT & PLAN:  Metastatic adenocarcinoma to brain She complained of profound fatigue which could very well be related to side-effects from recent treatment. I recommend we proceed with cycle 3 of therapy and then restage with PET/CT scan and MRI brain to assess response to treatment If next imaging studies show positive response to treatment, I plan to discontinue carboplatin and go on maintenance Alimta   Metastatic cancer to brain She tolerated radiation well apart from some memory loss. She did not tolerate steroid taper. I will adjust prednisone to 5 mg daily I plan to repeat MRI of the brain next month.   Chemotherapy-induced nausea Nausea has resolved Continue prednisone therapy and anti-emetics as needed   Chronic fatigue She has profound fatigue and weakness recently which I do not believe  is related to side effects of chemotherapy. I suspect she has a significant element of deconditioning and fatigue related to recent radiation. I recommend graduated exercise as tolerated She will remain on low dose prednisone I plan to recheck thyroid function test    COPD (chronic obstructive pulmonary disease) She has significant chronic COPD. I felt that steroids might help with her breathing, too. Plan to repeat imaging study of her chest    Orders Placed This Encounter  Procedures  . NM PET Image Restag (PS) Skull Base To Thigh    Standing Status: Future     Number of Occurrences:      Standing Expiration Date: 03/23/2016    Order Specific Question:  Reason for Exam (SYMPTOM  OR DIAGNOSIS REQUIRED)    Answer:  staging lung ca    Order Specific Question:  Preferred imaging location?    Answer:  [facility]  . MR Brain W Wo Contrast    Standing Status: Future     Number of Occurrences:      Standing Expiration Date: 03/23/2016    Order Specific Question:  Reason for exam:    Answer:  staging lung ca    Order Specific Question:  Preferred imaging location?    Answer:  [facility]    Order Specific Question:  Does the patient have a pacemaker or implanted devices?    Answer:  No  . TSH    Standing Status: Future     Number of Occurrences:      Standing Expiration Date: 03/23/2016  . T4, free    Standing Status: Future     Number of Occurrences:      Standing Expiration Date: 03/23/2016   All questions were answered. The patient knows to call the clinic with any problems, questions or concerns. No barriers to learning was detected. I spent 30 minutes counseling the patient face to face. The total time spent in the appointment was 40 minutes and more than 50% was on counseling and review of test results     [facility], [place], MD 01/23/2015 7:57 PM

## 2015-01-29 ENCOUNTER — Other Ambulatory Visit: Payer: Self-pay | Admitting: Hematology and Oncology

## 2015-01-29 ENCOUNTER — Telehealth: Payer: Self-pay | Admitting: *Deleted

## 2015-01-29 DIAGNOSIS — C7931 Secondary malignant neoplasm of brain: Secondary | ICD-10-CM

## 2015-01-29 MED ORDER — LORAZEPAM 1 MG PO TABS: 1.0000 mg | ORAL_TABLET | Freq: Three times a day (TID) | ORAL | Status: DC | PRN

## 2015-01-29 NOTE — Telephone Encounter (Signed)
Prescription ready for pick up Instruct patient to take 1 tablet an hours before scan and may repeat another dose 30 minutes later if still anxious

## 2015-01-29 NOTE — Telephone Encounter (Signed)
VM message from patient requesting prescription for something prior to her Brain MRI  and PET scan as she says she is claustrophobic.  Scans are scheduled for 02/11/15

## 2015-01-29 NOTE — Telephone Encounter (Signed)
Thank you  Call made to patient and she verbalized understanding. She will come and pick up script.

## 2015-02-10 ENCOUNTER — Telehealth: Payer: Self-pay | Admitting: *Deleted

## 2015-02-10 ENCOUNTER — Ambulatory Visit (INDEPENDENT_AMBULATORY_CARE_PROVIDER_SITE_OTHER): Payer: Medicare Other | Admitting: Cardiology

## 2015-02-10 ENCOUNTER — Encounter: Payer: Self-pay | Admitting: Cardiology

## 2015-02-10 VITALS — BP 130/78 | HR 91 | Ht 65.0 in | Wt 280.4 lb

## 2015-02-10 DIAGNOSIS — I214 Non-ST elevation (NSTEMI) myocardial infarction: Secondary | ICD-10-CM

## 2015-02-10 DIAGNOSIS — G4733 Obstructive sleep apnea (adult) (pediatric): Secondary | ICD-10-CM | POA: Diagnosis not present

## 2015-02-10 DIAGNOSIS — R778 Other specified abnormalities of plasma proteins: Secondary | ICD-10-CM

## 2015-02-10 DIAGNOSIS — R7989 Other specified abnormal findings of blood chemistry: Secondary | ICD-10-CM

## 2015-02-10 DIAGNOSIS — R0600 Dyspnea, unspecified: Secondary | ICD-10-CM

## 2015-02-10 DIAGNOSIS — R0609 Other forms of dyspnea: Secondary | ICD-10-CM

## 2015-02-10 DIAGNOSIS — I5032 Chronic diastolic (congestive) heart failure: Secondary | ICD-10-CM | POA: Diagnosis not present

## 2015-02-10 DIAGNOSIS — I10 Essential (primary) hypertension: Secondary | ICD-10-CM

## 2015-02-10 NOTE — Patient Instructions (Signed)
Medication Instructions:   Your physician recommends that you continue on your current medications as directed. Please refer to the Current Medication list given to you today.     Testing/Procedures:  Your physician has requested that you have an echocardiogram. Echocardiography is a painless test that uses sound waves to create images of your heart. It provides your doctor with information about the size and shape of your heart and how well your heart's chambers and valves are working. This procedure takes approximately one hour. There are no restrictions for this procedure.    Follow-Up:   4 MONTHS WITH DR Meda Coffee

## 2015-02-10 NOTE — Progress Notes (Signed)
Patient ID: Caitlin Carter, female   DOB: [birth year], 69 y.o.   MRN: [id number]    02/10/2015 Caitlin Carter   1946-04-20  [id number]  Primary Physician Antony Blackbird, MD Primary Cardiologist: Dr Meda Coffee  Chief complain: SOB  HPI:  69 y/o obese female, smoker, HTN, and dyslipidemia but no prior CAD, seen in the hospital in February 2016 for an elevated Troponin. She had presented 10/24/14 with Rt arm weakness. CT showed multiple brain lesions. She was diagnosed with Metastatic adenocarcinoma to brain (? Origin of the primary tumor). We were asked to see her for an elevated Troponin (1.32) and volume overload. The pt has no history of angina. An echo done showed preserved LVEF and grade 1 diastolic dysfunction.   Since the discharge she underwent 15 radiations sessions and 2 cycles of chemotherapy with carboplatin and Alimta. She has follow up scans tomorrow.  Today she states that she feels very tired, she gets SOB with mild exertion, no orthopnea or PND when sleeping with CPAP machine. No chest pain, no LE edema. No palpitations or syncope.    Current Outpatient Prescriptions  Medication Sig Dispense Refill  . albuterol (PROVENTIL HFA;VENTOLIN HFA) 108 (90 BASE) MCG/ACT inhaler Inhale 2 puffs into the lungs every 4 (four) hours as needed for wheezing or shortness of breath (Inhale 1-2 puffs into lungs as needed for shortness of breath.).    Marland Kitchen aspirin 81 MG tablet Take 81 mg by mouth daily.    Marland Kitchen emollient (BIAFINE) cream Apply topically as needed.    . folic acid (FOLVITE) 1 MG tablet Take 1 tablet (1 mg total) by mouth daily. Continue until 21 days after Alimta completed. 100 tablet 3  . levETIRAcetam (KEPPRA) 500 MG tablet TAKE 1 TABLET BY MOUTH TWICE DAILY 60 tablet 2  . lidocaine-prilocaine (EMLA) cream Apply to affected area once 30 g 3  . LORazepam (ATIVAN) 1 MG tablet Take 1 tablet (1 mg total) by mouth every 8 (eight) hours as needed for anxiety (or nausea). 10 tablet 0  . meclizine  (ANTIVERT) 12.5 MG tablet Take 1 tablet (12.5 mg total) by mouth 3 (three) times daily as needed for dizziness. 30 tablet 0  . Melatonin 10 MG CAPS Take 10 mg by mouth at bedtime as needed (sleep).     . ondansetron (ZOFRAN) 8 MG tablet Take 1 tablet (8 mg total) by mouth every 8 (eight) hours as needed. 60 tablet 1  . pantoprazole (PROTONIX) 40 MG tablet TAKE 1 TABLET BY MOUTH DAILY AT NOON 30 tablet 6  . PARoxetine (PAXIL) 20 MG tablet Take 20 mg by mouth daily as needed (restlessness).     . predniSONE (DELTASONE) 2.5 MG tablet Take 2.5 mg by mouth daily with breakfast.    . prochlorperazine (COMPAZINE) 10 MG tablet Take 1 tablet (10 mg total) by mouth every 6 (six) hours as needed (Nausea or vomiting). 30 tablet 1  . simvastatin (ZOCOR) 40 MG tablet Take 40 mg by mouth daily.     No current facility-administered medications for this visit.    No Known Allergies  History   Social History  . Marital Status: Widowed    Spouse Name: N/A  . Number of Children: N/A  . Years of Education: N/A   Occupational History  . Not on file.   Social History Main Topics  . Smoking status: Former Smoker -- 1.00 packs/day for 51 years    Types: Cigarettes    Start date: 09/13/1963  Quit date: 10/14/2014  . Smokeless tobacco: Never Used  . Alcohol Use: Not on file  . Drug Use: Not on file  . Sexual Activity: Not on file   Other Topics Concern  . Not on file   Social History Narrative     Review of Systems: General: negative for chills, fever, night sweats or weight changes.  Cardiovascular: negative for chest pain, dyspnea on exertion, edema, orthopnea, palpitations, paroxysmal nocturnal dyspnea or shortness of breath Dermatological: negative for rash Respiratory: negative for cough or wheezing Urologic: negative for hematuria Abdominal: negative for nausea, vomiting, diarrhea, bright red blood per rectum, melena, or hematemesis Neurologic: negative for visual changes, syncope, or  dizziness All other systems reviewed and are otherwise negative except as noted above.    Blood pressure 130/78, pulse 91, height '5\' 5"'$  (1.651 m), weight 280 lb 6.4 oz (127.189 kg), SpO2 97 %.  General appearance: alert, cooperative, no distress and morbidly obese Neck: no JVD Lungs: decreased breath sounds, no wheezing Heart: regular rate and rhythm Extremities: trace edema  EKG On 10/24/14- NSR without acute changes  ECHO: 10/24/2014 Study Conclusions  - Left ventricle: The cavity size was normal. Wall thickness was increased in a pattern of mild LVH. Systolic function was normal. The estimated ejection fraction was in the range of 60% to 65%. Wall motion was normal; there were no regional wall motion abnormalities. Doppler parameters are consistent with abnormal left ventricular relaxation (grade 1 diastolic dysfunction). - Left atrium: The atrium was mildly dilated.    ASSESSMENT AND PLAN:   1. DOE - probably multifactorial, mets to the lungs, COPD, ongoing smoking. Obesity, deconditioning, she doesn't appear fluid overloaeded  2. CHronic diastolic CHF - we will recheck echo for LVEF, she appears euvolemic  3. Troponin I above reference range Seen in the hospital in consult for elevated Troponin, felt to be secondary to demand ischemia, CHF, since her prognosis seems to be poor we will hold off on a stress test  4. OSA on CPAP . 5. COPD (chronic obstructive pulmonary disease) . 6. Obesity BMI 45  7. Essential hypertension Controlled  8. Dyslipidemia On Zocor  Follow up in 4 months.  Ena Dawley HPA-C 02/10/2015 3:51 PM

## 2015-02-10 NOTE — Telephone Encounter (Signed)
Called patient who reports her "caregiver was calling about appointments scheduled for tomorrow."  Observed 02-11-2015 appointments.  Instructed to not eat or drink after 6:00 am.  Arrive at [place] area at [facility] for 7:00 MRI brain.  Come to [facility] at 11:00 for labs followed by a 12:00 [facility] appointment.  Reports she "will go to pharmacy this evening for medicine for claustrophobia, go to sleep early tonight and plan to eat good after these tests because I know I will be hungry".  Instructed to ask for clarification tomorrow for the 12:00 appointment location which reads [facility] but also reads mobile and this nurse is not familiar with this.

## 2015-02-11 ENCOUNTER — Ambulatory Visit (HOSPITAL_COMMUNITY)
Admission: RE | Admit: 2015-02-11 | Discharge: 2015-02-11 | Disposition: A | Discharge status: Home / Self Care | Payer: Medicare Other | Source: Ambulatory Visit | Attending: Hematology and Oncology | Admitting: Hematology and Oncology

## 2015-02-11 ENCOUNTER — Encounter (HOSPITAL_COMMUNITY)
Admission: RE | Admit: 2015-02-11 | Discharge: 2015-02-11 | Disposition: A | Discharge status: Home / Self Care | Payer: Medicare Other | Source: Ambulatory Visit | Attending: Hematology and Oncology | Admitting: Hematology and Oncology

## 2015-02-11 ENCOUNTER — Ambulatory Visit (HOSPITAL_BASED_OUTPATIENT_CLINIC_OR_DEPARTMENT_OTHER): Payer: Medicare Other

## 2015-02-11 ENCOUNTER — Other Ambulatory Visit (HOSPITAL_BASED_OUTPATIENT_CLINIC_OR_DEPARTMENT_OTHER): Payer: Medicare Other

## 2015-02-11 DIAGNOSIS — E876 Hypokalemia: Secondary | ICD-10-CM | POA: Diagnosis not present

## 2015-02-11 DIAGNOSIS — Z95828 Presence of other vascular implants and grafts: Secondary | ICD-10-CM

## 2015-02-11 DIAGNOSIS — R110 Nausea: Secondary | ICD-10-CM | POA: Diagnosis not present

## 2015-02-11 DIAGNOSIS — Z452 Encounter for adjustment and management of vascular access device: Secondary | ICD-10-CM | POA: Diagnosis not present

## 2015-02-11 DIAGNOSIS — I5032 Chronic diastolic (congestive) heart failure: Secondary | ICD-10-CM

## 2015-02-11 DIAGNOSIS — H538 Other visual disturbances: Secondary | ICD-10-CM | POA: Diagnosis not present

## 2015-02-11 DIAGNOSIS — C7931 Secondary malignant neoplasm of brain: Secondary | ICD-10-CM

## 2015-02-11 DIAGNOSIS — Z79899 Other long term (current) drug therapy: Secondary | ICD-10-CM | POA: Insufficient documentation

## 2015-02-11 DIAGNOSIS — C3492 Malignant neoplasm of unspecified part of left bronchus or lung: Secondary | ICD-10-CM | POA: Diagnosis not present

## 2015-02-11 DIAGNOSIS — Z923 Personal history of irradiation: Secondary | ICD-10-CM | POA: Diagnosis not present

## 2015-02-11 DIAGNOSIS — C3490 Malignant neoplasm of unspecified part of unspecified bronchus or lung: Secondary | ICD-10-CM | POA: Diagnosis present

## 2015-02-11 DIAGNOSIS — R410 Disorientation, unspecified: Secondary | ICD-10-CM | POA: Insufficient documentation

## 2015-02-11 DIAGNOSIS — R5382 Chronic fatigue, unspecified: Secondary | ICD-10-CM

## 2015-02-11 DIAGNOSIS — R590 Localized enlarged lymph nodes: Secondary | ICD-10-CM | POA: Insufficient documentation

## 2015-02-11 LAB — CBC WITH DIFFERENTIAL/PLATELET
BASO%: 0.4 % (ref 0.0–2.0)
BASOS ABS: 0 10*3/uL (ref 0.0–0.1)
EOS%: 0.1 % (ref 0.0–7.0)
Eosinophils Absolute: 0 10*3/uL (ref 0.0–0.5)
HEMATOCRIT: 38.2 % (ref 34.8–46.6)
HGB: 11.9 g/dL (ref 11.6–15.9)
LYMPH%: 23.3 % (ref 14.0–49.7)
MCH: 31 pg (ref 25.1–34.0)
MCHC: 31.2 g/dL — ABNORMAL LOW (ref 31.5–36.0)
MCV: 99.5 fL (ref 79.5–101.0)
MONO#: 0.5 10*3/uL (ref 0.1–0.9)
MONO%: 7 % (ref 0.0–14.0)
NEUT%: 69.2 % (ref 38.4–76.8)
NEUTROS ABS: 4.9 10*3/uL (ref 1.5–6.5)
PLATELETS: 124 10*3/uL — AB (ref 145–400)
RBC: 3.84 10*6/uL (ref 3.70–5.45)
RDW: 19.3 % — ABNORMAL HIGH (ref 11.2–14.5)
WBC: 7 10*3/uL (ref 3.9–10.3)
lymph#: 1.6 10*3/uL (ref 0.9–3.3)

## 2015-02-11 LAB — TSH CHCC: TSH: 0.832 m[IU]/L (ref 0.308–3.960)

## 2015-02-11 LAB — COMPREHENSIVE METABOLIC PANEL (CC13)
ALBUMIN: 3.2 g/dL — AB (ref 3.5–5.0)
ALT: 9 U/L (ref 0–55)
ANION GAP: 9 meq/L (ref 3–11)
AST: 9 U/L (ref 5–34)
Alkaline Phosphatase: 70 U/L (ref 40–150)
BILIRUBIN TOTAL: 0.33 mg/dL (ref 0.20–1.20)
BUN: 12.1 mg/dL (ref 7.0–26.0)
CHLORIDE: 112 meq/L — AB (ref 98–109)
CO2: 24 meq/L (ref 22–29)
CREATININE: 0.7 mg/dL (ref 0.6–1.1)
Calcium: 9.2 mg/dL (ref 8.4–10.4)
EGFR: 88 mL/min/{1.73_m2} — AB (ref >90)
Glucose: 88 mg/dl (ref 70–140)
POTASSIUM: 3.7 meq/L (ref 3.5–5.1)
Sodium: 144 mEq/L (ref 136–145)
Total Protein: 6.2 g/dL — ABNORMAL LOW (ref 6.4–8.3)

## 2015-02-11 LAB — T4, FREE: FREE T4: 1.07 ng/dL (ref 0.80–1.80)

## 2015-02-11 LAB — GLUCOSE, CAPILLARY: Glucose-Capillary: 82 mg/dL (ref 65–99)

## 2015-02-11 MED ORDER — HEPARIN SOD (PORK) LOCK FLUSH 100 UNIT/ML IV SOLN
500.0000 [IU] | Freq: Once | INTRAVENOUS | Status: AC
  Administered 2015-02-11: 500 [IU] via INTRAVENOUS
  Filled 2015-02-11: qty 5

## 2015-02-11 MED ORDER — SODIUM CHLORIDE 0.9 % IJ SOLN
10.0000 mL | INTRAMUSCULAR | Status: DC | PRN
  Administered 2015-02-11: 10 mL via INTRAVENOUS
  Filled 2015-02-11: qty 10

## 2015-02-11 MED ORDER — GADOBENATE DIMEGLUMINE 529 MG/ML IV SOLN
20.0000 mL | Freq: Once | INTRAVENOUS | Status: AC | PRN
  Administered 2015-02-11: 20 mL via INTRAVENOUS

## 2015-02-11 NOTE — Patient Instructions (Signed)
Patient  to have port needle deaccessed after PET scan today before returning home unless otherwise directed.     Implanted [facility] Guide An implanted port is a type of central line that is placed under the skin. Central lines are used to provide IV access when treatment or nutrition needs to be given through a person's veins. Implanted ports are used for long-term IV access. An implanted port may be placed because:   You need IV medicine that would be irritating to the small veins in your hands or arms.   You need long-term IV medicines, such as antibiotics.   You need IV nutrition for a long period.   You need frequent blood draws for lab tests.   You need dialysis.  Implanted ports are usually placed in the chest area, but they can also be placed in the upper arm, the abdomen, or the leg. An implanted port has two main parts:   Reservoir. The reservoir is round and will appear as a small, raised area under your skin. The reservoir is the part where a needle is inserted to give medicines or draw blood.   Catheter. The catheter is a thin, flexible tube that extends from the reservoir. The catheter is placed into a large vein. Medicine that is inserted into the reservoir goes into the catheter and then into the vein.  HOW WILL I CARE FOR MY INCISION SITE? Do not get the incision site wet. Bathe or shower as directed by your health care provider.  HOW IS MY PORT ACCESSED? Special steps must be taken to access the port:   Before the port is accessed, a numbing cream can be placed on the skin. This helps numb the skin over the port site.   Your health care provider uses a sterile technique to access the port.  Your health care provider must put on a mask and sterile gloves.  The skin over your port is cleaned carefully with an antiseptic and allowed to dry.  The port is gently pinched between sterile gloves, and a needle is inserted into the port.  Only "non-coring" port  needles should be used to access the port. Once the port is accessed, a blood return should be checked. This helps ensure that the port is in the vein and is not clogged.   If your port needs to remain accessed for a constant infusion, a clear (transparent) bandage will be placed over the needle site. The bandage and needle will need to be changed every week, or as directed by your health care provider.   Keep the bandage covering the needle clean and dry. Do not get it wet. Follow your health care provider's instructions on how to take a shower or bath while the port is accessed.   If your port does not need to stay accessed, no bandage is needed over the port.  WHAT IS FLUSHING? Flushing helps keep the port from getting clogged. Follow your health care provider's instructions on how and when to flush the port. Ports are usually flushed with saline solution or a medicine called heparin. The need for flushing will depend on how the port is used.   If the port is used for intermittent medicines or blood draws, the port will need to be flushed:   After medicines have been given.   After blood has been drawn.   As part of routine maintenance.   If a constant infusion is running, the port may not need to be  flushed.  HOW LONG WILL MY PORT STAY IMPLANTED? The port can stay in for as long as your health care provider thinks it is needed. When it is time for the port to come out, surgery will be done to remove it. The procedure is similar to the one performed when the port was put in.  WHEN SHOULD I SEEK IMMEDIATE MEDICAL CARE? When you have an implanted port, you should seek immediate medical care if:   You notice a bad smell coming from the incision site.   You have swelling, redness, or drainage at the incision site.   You have more swelling or pain at the port site or the surrounding area.   You have a fever that is not controlled with medicine. Document Released: 08/29/2005  Document Revised: 06/19/2013 Document Reviewed: 05/06/2013 [facility] Patient Information 2015 [place], [place]. This information is not intended to replace advice given to you by your health care provider. Make sure you discuss any questions you have with your health care provider.

## 2015-02-12 ENCOUNTER — Ambulatory Visit (HOSPITAL_BASED_OUTPATIENT_CLINIC_OR_DEPARTMENT_OTHER): Payer: Medicare Other

## 2015-02-12 ENCOUNTER — Ambulatory Visit (HOSPITAL_BASED_OUTPATIENT_CLINIC_OR_DEPARTMENT_OTHER): Payer: Medicare Other | Admitting: Hematology and Oncology

## 2015-02-12 ENCOUNTER — Telehealth: Payer: Self-pay | Admitting: Hematology and Oncology

## 2015-02-12 ENCOUNTER — Encounter: Payer: Self-pay | Admitting: Hematology and Oncology

## 2015-02-12 ENCOUNTER — Telehealth: Payer: Self-pay | Admitting: *Deleted

## 2015-02-12 VITALS — BP 166/61 | HR 82 | Temp 99.3°F | Resp 18 | Ht 65.0 in | Wt 280.3 lb

## 2015-02-12 DIAGNOSIS — R110 Nausea: Secondary | ICD-10-CM

## 2015-02-12 DIAGNOSIS — C3492 Malignant neoplasm of unspecified part of left bronchus or lung: Secondary | ICD-10-CM

## 2015-02-12 DIAGNOSIS — C7931 Secondary malignant neoplasm of brain: Secondary | ICD-10-CM

## 2015-02-12 DIAGNOSIS — Z5111 Encounter for antineoplastic chemotherapy: Secondary | ICD-10-CM

## 2015-02-12 DIAGNOSIS — D6959 Other secondary thrombocytopenia: Secondary | ICD-10-CM | POA: Diagnosis not present

## 2015-02-12 DIAGNOSIS — T451X5A Adverse effect of antineoplastic and immunosuppressive drugs, initial encounter: Secondary | ICD-10-CM

## 2015-02-12 DIAGNOSIS — R5382 Chronic fatigue, unspecified: Secondary | ICD-10-CM

## 2015-02-12 DIAGNOSIS — T50905A Adverse effect of unspecified drugs, medicaments and biological substances, initial encounter: Secondary | ICD-10-CM

## 2015-02-12 MED ORDER — SODIUM CHLORIDE 0.9 % IJ SOLN
10.0000 mL | INTRAMUSCULAR | Status: DC | PRN
  Administered 2015-02-12: 10 mL
  Filled 2015-02-12: qty 10

## 2015-02-12 MED ORDER — SODIUM CHLORIDE 0.9 % IV SOLN
Freq: Once | INTRAVENOUS | Status: AC
  Administered 2015-02-12: 13:00:00 via INTRAVENOUS

## 2015-02-12 MED ORDER — SODIUM CHLORIDE 0.9 % IV SOLN
Freq: Once | INTRAVENOUS | Status: AC
  Administered 2015-02-12: 13:00:00 via INTRAVENOUS
  Filled 2015-02-12: qty 8

## 2015-02-12 MED ORDER — SODIUM CHLORIDE 0.9 % IV SOLN
667.5000 mg | Freq: Once | INTRAVENOUS | Status: AC
  Administered 2015-02-12: 670 mg via INTRAVENOUS
  Filled 2015-02-12: qty 67

## 2015-02-12 MED ORDER — PEMETREXED DISODIUM CHEMO INJECTION 500 MG
500.0000 mg/m2 | Freq: Once | INTRAVENOUS | Status: AC
  Administered 2015-02-12: 1200 mg via INTRAVENOUS
  Filled 2015-02-12: qty 48

## 2015-02-12 MED ORDER — HEPARIN SOD (PORK) LOCK FLUSH 100 UNIT/ML IV SOLN
500.0000 [IU] | Freq: Once | INTRAVENOUS | Status: AC | PRN
  Administered 2015-02-12: 500 [IU]
  Filled 2015-02-12: qty 5

## 2015-02-12 NOTE — Telephone Encounter (Signed)
Per staff message and POF I have scheduled appts. Advised scheduler of appts. JMW  

## 2015-02-12 NOTE — Patient Instructions (Signed)
[facility] Discharge Instructions for Patients Receiving Chemotherapy  Today you received the following chemotherapy agents: Alimta and Carboplatin.  To help prevent nausea and vomiting after your treatment, we encourage you to take your nausea medication as directed.   If you develop nausea and vomiting that is not controlled by your nausea medication, call the clinic.   BELOW ARE SYMPTOMS THAT SHOULD BE REPORTED IMMEDIATELY:  *FEVER GREATER THAN 100.5 F  *CHILLS WITH OR WITHOUT FEVER  NAUSEA AND VOMITING THAT IS NOT CONTROLLED WITH YOUR NAUSEA MEDICATION  *UNUSUAL SHORTNESS OF BREATH  *UNUSUAL BRUISING OR BLEEDING  TENDERNESS IN MOUTH AND THROAT WITH OR WITHOUT PRESENCE OF ULCERS  *URINARY PROBLEMS  *BOWEL PROBLEMS  UNUSUAL RASH Items with * indicate a potential emergency and should be followed up as soon as possible.  Feel free to call the clinic you have any questions or concerns. The clinic phone number is [phone].  Please show the CHEMO ALERT CARD at check-in to the Emergency Department and triage nurse.   

## 2015-02-12 NOTE — Telephone Encounter (Signed)
per pof to sch pt appt-sent MW email to sch trmt-gave pt copy of sch °

## 2015-02-13 ENCOUNTER — Ambulatory Visit (HOSPITAL_BASED_OUTPATIENT_CLINIC_OR_DEPARTMENT_OTHER): Payer: Medicare Other

## 2015-02-13 VITALS — BP 135/69 | HR 79 | Temp 98.3°F

## 2015-02-13 DIAGNOSIS — C7931 Secondary malignant neoplasm of brain: Secondary | ICD-10-CM

## 2015-02-13 DIAGNOSIS — C3492 Malignant neoplasm of unspecified part of left bronchus or lung: Secondary | ICD-10-CM

## 2015-02-13 DIAGNOSIS — Z5189 Encounter for other specified aftercare: Secondary | ICD-10-CM

## 2015-02-13 DIAGNOSIS — D6959 Other secondary thrombocytopenia: Secondary | ICD-10-CM | POA: Insufficient documentation

## 2015-02-13 DIAGNOSIS — T50905A Adverse effect of unspecified drugs, medicaments and biological substances, initial encounter: Secondary | ICD-10-CM

## 2015-02-13 MED ORDER — PEGFILGRASTIM INJECTION 6 MG/0.6ML ~~LOC~~
6.0000 mg | PREFILLED_SYRINGE | Freq: Once | SUBCUTANEOUS | Status: AC
  Administered 2015-02-13: 6 mg via SUBCUTANEOUS
  Filled 2015-02-13: qty 0.6

## 2015-02-13 NOTE — Assessment & Plan Note (Signed)
This is likely related to deconditioning and side effects from treatment. Recent thyroid function test was normal. I encouraged the patient graduated exercise as tolerated.

## 2015-02-13 NOTE — Assessment & Plan Note (Signed)
She tolerated radiation well apart from some memory loss. MRI show mild swelling but no evidence of new brain metastasis. I recommend she reduce Keppra to 500 mg daily and continue prednisone taper.

## 2015-02-13 NOTE — Progress Notes (Signed)
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   12/11/2014 - 02/12/2015 Chemotherapy she received 4 cycles of carboplatin and Alimta   02/11/2015 Imaging Repeat MRI and PET CT scan show excellent response to treatment    INTERVAL HISTORY: Please see below for problem oriented charting. She returns today prior to cycle 4 of treatment. She continues of fatigue but they are improving. Denies any recent headache, nausea or vomiting. No new neurological deficit. She denies peripheral neuropathy. No recent signs of infection or cough.  REVIEW OF SYSTEMS:   Constitutional: Denies fevers, chills or abnormal weight loss Eyes: Denies blurriness of vision Ears, nose, mouth, throat, and face: Denies mucositis or sore throat Respiratory: Denies cough, dyspnea or wheezes Cardiovascular: Denies palpitation, chest discomfort or lower extremity swelling Gastrointestinal:  Denies nausea, heartburn or change in bowel habits Skin: Denies abnormal skin  rashes Lymphatics: Denies new lymphadenopathy or easy bruising Neurological:Denies numbness, tingling or new weaknesses Behavioral/Psych: Mood is stable, no new changes  All other systems were reviewed with the patient and are negative.  I have reviewed the past medical history, past surgical history, social history and family history with the patient and they are unchanged from previous note.  ALLERGIES:  has No Known Allergies.  MEDICATIONS:  Current Outpatient Prescriptions  Medication Sig Dispense Refill  . albuterol (PROVENTIL HFA;VENTOLIN HFA) 108 (90 BASE) MCG/ACT inhaler Inhale 2 puffs into the lungs every 4 (four) hours as needed for wheezing or shortness of breath (Inhale 1-2 puffs into lungs as needed for shortness of breath.).    Marland Kitchen aspirin 81 MG tablet Take 81 mg by mouth daily.    Marland Kitchen emollient (BIAFINE) cream Apply topically as needed.    . folic acid (FOLVITE) 1 MG tablet Take 1 tablet (1 mg total) by mouth daily. Continue until 21 days after Alimta completed. 100 tablet 3  . levETIRAcetam (KEPPRA) 500 MG tablet TAKE 1 TABLET BY MOUTH TWICE DAILY 60 tablet 2  . lidocaine-prilocaine (EMLA) cream Apply to affected area once 30 g 3  . LORazepam (ATIVAN) 1 MG tablet Take 1 tablet (1 mg total) by mouth every 8 (eight) hours as needed for anxiety (or nausea). 10 tablet 0  . meclizine (ANTIVERT) 12.5 MG tablet Take 1 tablet (12.5 mg total) by mouth 3 (three) times daily as needed for dizziness. 30 tablet 0  . Melatonin 10 MG CAPS Take 10 mg by mouth at bedtime as needed (sleep).     . ondansetron (ZOFRAN) 8 MG tablet Take 1 tablet (8 mg total) by mouth every 8 (eight) hours  as needed. 60 tablet 1  . pantoprazole (PROTONIX) 40 MG tablet TAKE 1 TABLET BY MOUTH DAILY AT NOON 30 tablet 6  . PARoxetine (PAXIL) 20 MG tablet Take 20 mg by mouth daily as needed (restlessness).     . predniSONE (DELTASONE) 2.5 MG tablet Take 2.5 mg by mouth daily with breakfast.    . prochlorperazine  (COMPAZINE) 10 MG tablet Take 1 tablet (10 mg total) by mouth every 6 (six) hours as needed (Nausea or vomiting). 30 tablet 1  . simvastatin (ZOCOR) 40 MG tablet Take 40 mg by mouth daily.     No current facility-administered medications for this visit.    PHYSICAL EXAMINATION: ECOG PERFORMANCE STATUS: 2 - Symptomatic, <50% confined to bed  Filed Vitals:   02/12/15 1155  BP: 166/61  Pulse: 82  Temp: 99.3 F (37.4 C)  Resp: 18   Filed Weights   02/12/15 1155  Weight: 280 lb 4.8 oz (127.143 kg)    GENERAL:alert, no distress and comfortable. She is morbidly obese, sitting on the wheelchair and appear cushingoid SKIN: skin color, texture, turgor are normal, no rashes or significant lesions EYES: normal, Conjunctiva are pink and non-injected, sclera clear OROPHARYNX:no exudate, no erythema and lips, buccal mucosa, and tongue normal  NECK: supple, thyroid normal size, non-tender, without nodularity LYMPH:  no palpable lymphadenopathy in the cervical, axillary or inguinal LUNGS: clear to auscultation and percussion with normal breathing effort HEART: regular rate & rhythm and no murmurs and no lower extremity edema ABDOMEN:abdomen soft, non-tender and normal bowel sounds Musculoskeletal:no cyanosis of digits and no clubbing  NEURO: alert & oriented x 3 with fluent speech, no focal motor/sensory deficits  LABORATORY DATA:  I have reviewed the data as listed    Component Value Date/Time   NA 144 02/11/2015 1114   NA 142 12/09/2014 1204   K 3.7 02/11/2015 1114   K 2.9* 12/09/2014 1204   CL 105 12/09/2014 1204   CO2 24 02/11/2015 1114   CO2 25 12/09/2014 1204   GLUCOSE 88 02/11/2015 1114   GLUCOSE 83 12/09/2014 1204   BUN 12.1 02/11/2015 1114   BUN 17 12/09/2014 1204   CREATININE 0.7 02/11/2015 1114   CREATININE 0.78 12/09/2014 1204   CALCIUM 9.2 02/11/2015 1114   CALCIUM 9.1 12/09/2014 1204   PROT 6.2* 02/11/2015 1114   PROT 6.5 12/09/2014 1204   ALBUMIN 3.2* 02/11/2015  1114   ALBUMIN 3.5 12/09/2014 1204   AST 9 02/11/2015 1114   AST 18 12/09/2014 1204   ALT 9 02/11/2015 1114   ALT 28 12/09/2014 1204   ALKPHOS 70 02/11/2015 1114   ALKPHOS 61 12/09/2014 1204   BILITOT 0.33 02/11/2015 1114   BILITOT 0.5 12/09/2014 1204   GFRNONAA 84* 12/09/2014 1204   GFRAA >90 12/09/2014 1204    No results found for: SPEP, UPEP  Lab Results  Component Value Date   WBC 7.0 02/11/2015   NEUTROABS 4.9 02/11/2015   HGB 11.9 02/11/2015   HCT 38.2 02/11/2015   MCV 99.5 02/11/2015   PLT 124* 02/11/2015      Chemistry      Component Value Date/Time   NA 144 02/11/2015 1114   NA 142 12/09/2014 1204   K 3.7 02/11/2015 1114   K 2.9* 12/09/2014 1204   CL 105 12/09/2014 1204   CO2 24 02/11/2015 1114   CO2 25 12/09/2014 1204   BUN 12.1 02/11/2015 1114   BUN 17 12/09/2014 1204   CREATININE 0.7 02/11/2015 1114  CREATININE 0.78 12/09/2014 1204      Component Value Date/Time   CALCIUM 9.2 02/11/2015 1114   CALCIUM 9.1 12/09/2014 1204   ALKPHOS 70 02/11/2015 1114   ALKPHOS 61 12/09/2014 1204   AST 9 02/11/2015 1114   AST 18 12/09/2014 1204   ALT 9 02/11/2015 1114   ALT 28 12/09/2014 1204   BILITOT 0.33 02/11/2015 1114   BILITOT 0.5 12/09/2014 1204       RADIOGRAPHIC STUDIES: I reviewed numerous imaging study of MRI and PET CT scan with her and family I have personally reviewed the radiological images as listed and agreed with the findings in the report.   ASSESSMENT & PLAN:  Metastatic adenocarcinoma to brain She complained of profound fatigue which could very well be related to side-effects from recent treatment. With such excellent response to treatment, I recommend she complete 4 cycles of combination carboplatin and Alimta and then be switched to Alimta maintenance therapy. Plan to repeat another imaging study in September 2016.   Metastatic cancer to brain She tolerated radiation well apart from some memory loss. MRI show mild swelling but no  evidence of new brain metastasis. I recommend she reduce Keppra to 500 mg daily and continue prednisone taper.   Chemotherapy-induced nausea This has resolved. Continue anti-medics as needed.   Chronic fatigue This is likely related to deconditioning and side effects from treatment. Recent thyroid function test was normal. I encouraged the patient graduated exercise as tolerated.   Thrombocytopenia due to drugs This is likely due to recent treatment. The patient denies recent history of bleeding such as epistaxis, hematuria or hematochezia. She is asymptomatic from the low platelet count. I will observe for now.  she does not require transfusion now. I will continue the chemotherapy at current dose without dosage adjustment.  If the thrombocytopenia gets progressive worse in the future, I might have to delay her treatment or adjust the chemotherapy dose.      No orders of the defined types were placed in this encounter.   All questions were answered. The patient knows to call the clinic with any problems, questions or concerns. No barriers to learning was detected. I spent 30 minutes counseling the patient face to face. The total time spent in the appointment was 40 minutes and more than 50% was on counseling and review of test results     [facility], [place], MD 02/13/2015 3:04 PM

## 2015-02-13 NOTE — Assessment & Plan Note (Signed)
She complained of profound fatigue which could very well be related to side-effects from recent treatment. With such excellent response to treatment, I recommend she complete 4 cycles of combination carboplatin and Alimta and then be switched to Alimta maintenance therapy. Plan to repeat another imaging study in September 2016.

## 2015-02-13 NOTE — Assessment & Plan Note (Signed)
This is likely due to recent treatment. The patient denies recent history of bleeding such as epistaxis, hematuria or hematochezia. She is asymptomatic from the low platelet count. I will observe for now.  she does not require transfusion now. I will continue the chemotherapy at current dose without dosage adjustment.  If the thrombocytopenia gets progressive worse in the future, I might have to delay her treatment or adjust the chemotherapy dose.   

## 2015-02-13 NOTE — Assessment & Plan Note (Signed)
This has resolved. Continue anti-medics as needed.

## 2015-02-16 ENCOUNTER — Other Ambulatory Visit: Payer: Self-pay | Admitting: Hematology and Oncology

## 2015-02-26 ENCOUNTER — Ambulatory Visit (HOSPITAL_COMMUNITY): Payer: Medicare Other

## 2015-03-04 ENCOUNTER — Ambulatory Visit (HOSPITAL_COMMUNITY): Payer: Medicare Other

## 2015-03-05 ENCOUNTER — Telehealth: Payer: Self-pay | Admitting: Hematology and Oncology

## 2015-03-05 ENCOUNTER — Other Ambulatory Visit (HOSPITAL_BASED_OUTPATIENT_CLINIC_OR_DEPARTMENT_OTHER): Payer: Medicare Other

## 2015-03-05 ENCOUNTER — Encounter: Payer: Self-pay | Admitting: Hematology and Oncology

## 2015-03-05 ENCOUNTER — Ambulatory Visit (HOSPITAL_BASED_OUTPATIENT_CLINIC_OR_DEPARTMENT_OTHER): Payer: Medicare Other | Admitting: Hematology and Oncology

## 2015-03-05 ENCOUNTER — Ambulatory Visit (HOSPITAL_BASED_OUTPATIENT_CLINIC_OR_DEPARTMENT_OTHER): Payer: Medicare Other

## 2015-03-05 ENCOUNTER — Ambulatory Visit: Payer: Medicare Other

## 2015-03-05 VITALS — BP 160/63 | HR 67 | Temp 98.8°F | Resp 18 | Ht 65.0 in | Wt 276.0 lb

## 2015-03-05 DIAGNOSIS — W19XXXS Unspecified fall, sequela: Secondary | ICD-10-CM

## 2015-03-05 DIAGNOSIS — C3492 Malignant neoplasm of unspecified part of left bronchus or lung: Secondary | ICD-10-CM

## 2015-03-05 DIAGNOSIS — Y92009 Unspecified place in unspecified non-institutional (private) residence as the place of occurrence of the external cause: Secondary | ICD-10-CM

## 2015-03-05 DIAGNOSIS — R110 Nausea: Secondary | ICD-10-CM

## 2015-03-05 DIAGNOSIS — C7931 Secondary malignant neoplasm of brain: Secondary | ICD-10-CM

## 2015-03-05 DIAGNOSIS — H109 Unspecified conjunctivitis: Secondary | ICD-10-CM

## 2015-03-05 DIAGNOSIS — Z95828 Presence of other vascular implants and grafts: Secondary | ICD-10-CM

## 2015-03-05 DIAGNOSIS — Z5111 Encounter for antineoplastic chemotherapy: Secondary | ICD-10-CM | POA: Diagnosis not present

## 2015-03-05 DIAGNOSIS — W19XXXA Unspecified fall, initial encounter: Secondary | ICD-10-CM | POA: Insufficient documentation

## 2015-03-05 DIAGNOSIS — T451X5A Adverse effect of antineoplastic and immunosuppressive drugs, initial encounter: Secondary | ICD-10-CM

## 2015-03-05 LAB — CBC WITH DIFFERENTIAL/PLATELET
BASO%: 0.2 % (ref 0.0–2.0)
BASOS ABS: 0 10*3/uL (ref 0.0–0.1)
EOS ABS: 0 10*3/uL (ref 0.0–0.5)
EOS%: 0 % (ref 0.0–7.0)
HCT: 41.5 % (ref 34.8–46.6)
HEMOGLOBIN: 13.5 g/dL (ref 11.6–15.9)
LYMPH%: 11.3 % — AB (ref 14.0–49.7)
MCH: 32.5 pg (ref 25.1–34.0)
MCHC: 32.5 g/dL (ref 31.5–36.0)
MCV: 99.8 fL (ref 79.5–101.0)
MONO#: 0.6 10*3/uL (ref 0.1–0.9)
MONO%: 6.7 % (ref 0.0–14.0)
NEUT%: 81.8 % — ABNORMAL HIGH (ref 38.4–76.8)
NEUTROS ABS: 6.8 10*3/uL — AB (ref 1.5–6.5)
PLATELETS: 191 10*3/uL (ref 145–400)
RBC: 4.16 10*6/uL (ref 3.70–5.45)
RDW: 17.4 % — ABNORMAL HIGH (ref 11.2–14.5)
WBC: 8.3 10*3/uL (ref 3.9–10.3)
lymph#: 0.9 10*3/uL (ref 0.9–3.3)

## 2015-03-05 LAB — COMPREHENSIVE METABOLIC PANEL (CC13)
ALT: 13 U/L (ref 0–55)
ANION GAP: 9 meq/L (ref 3–11)
AST: 13 U/L (ref 5–34)
Albumin: 3.8 g/dL (ref 3.5–5.0)
Alkaline Phosphatase: 96 U/L (ref 40–150)
BUN: 8.1 mg/dL (ref 7.0–26.0)
CALCIUM: 9.6 mg/dL (ref 8.4–10.4)
CO2: 24 mEq/L (ref 22–29)
Chloride: 108 mEq/L (ref 98–109)
Creatinine: 0.8 mg/dL (ref 0.6–1.1)
EGFR: 81 mL/min/{1.73_m2} — ABNORMAL LOW (ref >90)
Glucose: 91 mg/dl (ref 70–140)
POTASSIUM: 3.7 meq/L (ref 3.5–5.1)
SODIUM: 141 meq/L (ref 136–145)
TOTAL PROTEIN: 7 g/dL (ref 6.4–8.3)
Total Bilirubin: 0.29 mg/dL (ref 0.20–1.20)

## 2015-03-05 MED ORDER — TOBRAMYCIN 0.3 % OP SOLN: 2.0000 [drp] | OPHTHALMIC | Status: DC

## 2015-03-05 MED ORDER — SODIUM CHLORIDE 0.9 % IJ SOLN
10.0000 mL | INTRAMUSCULAR | Status: DC | PRN
  Filled 2015-03-05: qty 10

## 2015-03-05 MED ORDER — SODIUM CHLORIDE 0.9 % IJ SOLN
10.0000 mL | INTRAMUSCULAR | Status: DC | PRN
  Administered 2015-03-05: 10 mL
  Filled 2015-03-05: qty 10

## 2015-03-05 MED ORDER — SODIUM CHLORIDE 0.9 % IV SOLN
Freq: Once | INTRAVENOUS | Status: AC
  Administered 2015-03-05: 13:00:00 via INTRAVENOUS

## 2015-03-05 MED ORDER — SODIUM CHLORIDE 0.9 % IV SOLN
500.0000 mg/m2 | Freq: Once | INTRAVENOUS | Status: AC
  Administered 2015-03-05: 1200 mg via INTRAVENOUS
  Filled 2015-03-05: qty 48

## 2015-03-05 MED ORDER — HEPARIN SOD (PORK) LOCK FLUSH 100 UNIT/ML IV SOLN
500.0000 [IU] | Freq: Once | INTRAVENOUS | Status: AC | PRN
  Administered 2015-03-05: 500 [IU]
  Filled 2015-03-05: qty 5

## 2015-03-05 MED ORDER — DEXAMETHASONE SODIUM PHOSPHATE 100 MG/10ML IJ SOLN
Freq: Once | INTRAMUSCULAR | Status: AC
  Administered 2015-03-05: 13:00:00 via INTRAVENOUS
  Filled 2015-03-05: qty 4

## 2015-03-05 NOTE — Telephone Encounter (Signed)
per pof to sch pt appt-gave pt copy of avs °

## 2015-03-05 NOTE — Assessment & Plan Note (Signed)
Continue anti-medics as needed. If it is still severe after this treatment, I will add Aloxi next cycle.

## 2015-03-05 NOTE — Progress Notes (Signed)
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   12/11/2014 - 02/12/2015 Chemotherapy she received 4 cycles of carboplatin and Alimta   02/11/2015 Imaging Repeat MRI and PET CT scan show excellent response to treatment    INTERVAL HISTORY: Please see below for problem oriented charting. She is seen prior to consolidation treatment with Alimta. She complained of fatigue. She had one recent fall at home without any significant injury due to weakness. Denies new focal neurological deficit or seizures. She has shortness of breath on minimal exertion due to COPD. Denies recent cough.  REVIEW OF SYSTEMS:   Constitutional: Denies fevers, chills or abnormal weight loss Eyes: Denies blurriness of vision Ears, nose, mouth, throat, and face: Denies mucositis or sore throat Cardiovascular: Denies palpitation, chest discomfort or lower extremity swelling Gastrointestinal:  Denies nausea, heartburn or change in bowel habits Skin: Denies abnormal skin  rashes Lymphatics: Denies new lymphadenopathy or easy bruising Behavioral/Psych: Mood is stable, no new changes  All other systems were reviewed with the patient and are negative.  I have reviewed the past medical history, past surgical history, social history and family history with the patient and they are unchanged from previous note.  ALLERGIES:  has No Known Allergies.  MEDICATIONS:  Current Outpatient Prescriptions  Medication Sig Dispense Refill  . albuterol (PROVENTIL HFA;VENTOLIN HFA) 108 (90 BASE) MCG/ACT inhaler Inhale 2 puffs into the lungs every 4 (four) hours as needed for wheezing or shortness of breath (Inhale 1-2 puffs into lungs as needed for shortness of breath.).    Marland Kitchen aspirin 81 MG tablet Take 81 mg by mouth daily.    Marland Kitchen emollient (BIAFINE) cream Apply topically as needed.    . levETIRAcetam (KEPPRA) 500 MG tablet TAKE 1 TABLET BY MOUTH TWICE DAILY 60 tablet 2  . LORazepam (ATIVAN) 1 MG tablet Take 1 tablet (1 mg total) by mouth every 8 (eight) hours as needed for anxiety (or nausea). 10 tablet 0  . meclizine (ANTIVERT) 12.5 MG tablet Take 1 tablet (12.5 mg total) by mouth 3 (three) times daily as needed for dizziness. 30 tablet 0  . Melatonin 10 MG CAPS Take 10 mg by mouth at bedtime as needed (sleep).     . ondansetron (ZOFRAN) 8 MG tablet Take 1 tablet (8 mg total) by mouth every 8 (eight) hours as needed. 60 tablet 1  . pantoprazole (PROTONIX) 40 MG tablet TAKE 1 TABLET BY MOUTH DAILY AT NOON 30 tablet 6  . PARoxetine (PAXIL) 20 MG tablet Take 20 mg by mouth daily as needed (restlessness).     Marland Kitchen  predniSONE (DELTASONE) 2.5 MG tablet Take 2.5 mg by mouth daily with breakfast.    . simvastatin (ZOCOR) 40 MG tablet Take 40 mg by mouth daily.    Marland Kitchen tobramycin (TOBREX) 0.3 % ophthalmic solution Place 2 drops into both eyes every 4 (four) hours. 5 mL 0   No current facility-administered medications for this visit.   Facility-Administered Medications Ordered in Other Visits   Medication Dose Route Frequency Provider Last Rate Last Dose  . sodium chloride 0.9 % injection 10 mL  10 mL Intravenous PRN Heath Lark, MD        PHYSICAL EXAMINATION: ECOG PERFORMANCE STATUS: 2 - Symptomatic, <50% confined to bed  Filed Vitals:   03/05/15 1154  BP: 160/63  Pulse: 67  Temp: 98.8 F (37.1 C)  Resp: 18   Filed Weights   03/05/15 1154  Weight: 276 lb (125.193 kg)    GENERAL:alert, no distress and comfortable. She is morbidly obese and cushingoid SKIN: skin color, texture, turgor are normal, no rashes or significant lesions EYES: normal, Conjunctiva are pink and non-injected, sclera clear OROPHARYNX:no exudate, no erythema and lips, buccal mucosa, and tongue normal  NECK: supple, thyroid normal size, non-tender, without nodularity LYMPH:  no palpable lymphadenopathy in the cervical, axillary or inguinal LUNGS: clear to auscultation and percussion with normal breathing effort HEART: regular rate & rhythm and no murmurs and no lower extremity edema ABDOMEN:abdomen soft, non-tender and normal bowel sounds Musculoskeletal:no cyanosis of digits and no clubbing  NEURO: alert & oriented x 3 with fluent speech, no focal motor/sensory deficits  LABORATORY DATA:  I have reviewed the data as listed    Component Value Date/Time   NA 141 03/05/2015 1051   NA 142 12/09/2014 1204   K 3.7 03/05/2015 1051   K 2.9* 12/09/2014 1204   CL 105 12/09/2014 1204   CO2 24 03/05/2015 1051   CO2 25 12/09/2014 1204   GLUCOSE 91 03/05/2015 1051   GLUCOSE 83 12/09/2014 1204   BUN 8.1 03/05/2015 1051   BUN 17 12/09/2014 1204   CREATININE 0.8 03/05/2015 1051   CREATININE 0.78 12/09/2014 1204   CALCIUM 9.6 03/05/2015 1051   CALCIUM 9.1 12/09/2014 1204   PROT 7.0 03/05/2015 1051   PROT 6.5 12/09/2014 1204   ALBUMIN 3.8 03/05/2015 1051   ALBUMIN 3.5 12/09/2014 1204   AST 13 03/05/2015 1051   AST 18 12/09/2014 1204   ALT 13 03/05/2015 1051   ALT 28 12/09/2014 1204   ALKPHOS 96  03/05/2015 1051   ALKPHOS 61 12/09/2014 1204   BILITOT 0.29 03/05/2015 1051   BILITOT 0.5 12/09/2014 1204   GFRNONAA 84* 12/09/2014 1204   GFRAA >90 12/09/2014 1204    No results found for: SPEP, UPEP  Lab Results  Component Value Date   WBC 8.3 03/05/2015   NEUTROABS 6.8* 03/05/2015   HGB 13.5 03/05/2015   HCT 41.5 03/05/2015   MCV 99.8 03/05/2015   PLT 191 03/05/2015      Chemistry      Component Value Date/Time   NA 141 03/05/2015 1051   NA 142 12/09/2014 1204   K 3.7 03/05/2015 1051   K 2.9* 12/09/2014 1204   CL 105 12/09/2014 1204   CO2 24 03/05/2015 1051   CO2 25 12/09/2014 1204   BUN 8.1 03/05/2015 1051   BUN 17 12/09/2014 1204   CREATININE 0.8 03/05/2015 1051   CREATININE 0.78 12/09/2014 1204      Component Value Date/Time   CALCIUM 9.6 03/05/2015 1051  CALCIUM 9.1 12/09/2014 1204   ALKPHOS 96 03/05/2015 1051   ALKPHOS 61 12/09/2014 1204   AST 13 03/05/2015 1051   AST 18 12/09/2014 1204   ALT 13 03/05/2015 1051   ALT 28 12/09/2014 1204   BILITOT 0.29 03/05/2015 1051   BILITOT 0.5 12/09/2014 1204       ASSESSMENT & PLAN:  Metastatic adenocarcinoma to brain She complained of profound fatigue which could very well be related to side-effects from recent treatment. She had completed 4 cycles of combination carboplatin and Alimta and starting today, will be switched to Alimta maintenance therapy. Plan to repeat another imaging study in September 2016.    Chemotherapy-induced nausea Continue anti-medics as needed. If it is still severe after this treatment, I will add Aloxi next cycle.   Metastatic cancer to brain She tolerated radiation well apart from some memory loss. MRI show mild swelling but no evidence of new brain metastasis. I recommend she reduce Keppra to 500 mg daily and continue prednisone taper with plans to stop prednisone on 03/16/2015    Fall at home She had recent fall a week ago at home due to weakness. I will proceed with  prednisone taper. When she feels better, we will start physical therapy.   No orders of the defined types were placed in this encounter.   All questions were answered. The patient knows to call the clinic with any problems, questions or concerns. No barriers to learning was detected. I spent 25 minutes counseling the patient face to face. The total time spent in the appointment was 30 minutes and more than 50% was on counseling and review of test results     [facility], [place], MD 03/05/2015 12:47 PM

## 2015-03-05 NOTE — Telephone Encounter (Signed)
Faxed pt medical records to [facility] [phone] °

## 2015-03-05 NOTE — Assessment & Plan Note (Signed)
She tolerated radiation well apart from some memory loss. MRI show mild swelling but no evidence of new brain metastasis. I recommend she reduce Keppra to 500 mg daily and continue prednisone taper with plans to stop prednisone on 03/16/2015

## 2015-03-05 NOTE — Patient Instructions (Signed)
[facility] Discharge Instructions for Patients Receiving Chemotherapy  Today you received the following chemotherapy agents; Alimta.   To help prevent nausea and vomiting after your treatment, we encourage you to take your nausea medication as directed.    If you develop nausea and vomiting that is not controlled by your nausea medication, call the clinic.   BELOW ARE SYMPTOMS THAT SHOULD BE REPORTED IMMEDIATELY:  *FEVER GREATER THAN 100.5 F  *CHILLS WITH OR WITHOUT FEVER  NAUSEA AND VOMITING THAT IS NOT CONTROLLED WITH YOUR NAUSEA MEDICATION  *UNUSUAL SHORTNESS OF BREATH  *UNUSUAL BRUISING OR BLEEDING  TENDERNESS IN MOUTH AND THROAT WITH OR WITHOUT PRESENCE OF ULCERS  *URINARY PROBLEMS  *BOWEL PROBLEMS  UNUSUAL RASH Items with * indicate a potential emergency and should be followed up as soon as possible.  Feel free to call the clinic you have any questions or concerns. The clinic phone number is [phone].  Please show the CHEMO ALERT CARD at check-in to the Emergency Department and triage nurse.   

## 2015-03-05 NOTE — Assessment & Plan Note (Signed)
She had recent fall a week ago at home due to weakness. I will proceed with prednisone taper. When she feels better, we will start physical therapy.

## 2015-03-05 NOTE — Progress Notes (Signed)
Pt in for lab, flush, MD visit, and infusion.  Pt states that she didn't have her numbing cream on her PAC and would prefer for her labs to be drawn from her arm in order to give the cream time to work.  Phlebotomist made aware of pt request.

## 2015-03-05 NOTE — Assessment & Plan Note (Signed)
She complained of profound fatigue which could very well be related to side-effects from recent treatment. She had completed 4 cycles of combination carboplatin and Alimta and starting today, will be switched to Alimta maintenance therapy. Plan to repeat another imaging study in September 2016.

## 2015-03-06 ENCOUNTER — Telehealth: Payer: Self-pay

## 2015-03-06 DIAGNOSIS — J441 Chronic obstructive pulmonary disease with (acute) exacerbation: Secondary | ICD-10-CM

## 2015-03-06 MED ORDER — ALBUTEROL SULFATE HFA 108 (90 BASE) MCG/ACT IN AERS: 2.0000 | INHALATION_SPRAY | RESPIRATORY_TRACT | Status: DC | PRN

## 2015-03-06 NOTE — Telephone Encounter (Signed)
Pt has had great response from her eye drops. She is asking how long to use them and when she can start using her contacts again. Second question is she is out of her albuterol. [facility] was [facility] prescrib[facility]. She had to cancel her appt with [facility] so has not seen them recently. Will Dr Alvy Bimler approve albuterol?

## 2015-03-06 NOTE — Addendum Note (Signed)
Addended by: Janace Hoard on: 03/06/2015 10:46 AM   Modules accepted: Orders

## 2015-03-06 NOTE — Telephone Encounter (Addendum)
Explained continue eye drops for 7 days and not to wear contacts for that time. Pt was getting confused when I was asking her about a lung doctor and she was thinking heart doctor. I encouraged her to make an appt with her PCP to direct her care. She understood that and said she would make an appt with Dr Chapman Fitch.

## 2015-03-06 NOTE — Telephone Encounter (Signed)
Use eye drops for total 7 days. OK for albuterol refill X 1 She should really have long term follow-up with pulmonologist for COPD. If she does not have a lung specialist, please refer for COPD management

## 2015-03-26 ENCOUNTER — Ambulatory Visit (HOSPITAL_BASED_OUTPATIENT_CLINIC_OR_DEPARTMENT_OTHER): Payer: Medicare Other | Admitting: Hematology and Oncology

## 2015-03-26 ENCOUNTER — Ambulatory Visit
Admission: RE | Admit: 2015-03-26 | Discharge: 2015-03-26 | Disposition: A | Discharge status: Home / Self Care | Payer: Medicare Other | Source: Ambulatory Visit | Attending: Radiation Oncology | Admitting: Radiation Oncology

## 2015-03-26 ENCOUNTER — Encounter: Payer: Self-pay | Admitting: Hematology and Oncology

## 2015-03-26 ENCOUNTER — Encounter: Payer: Self-pay | Admitting: Radiation Oncology

## 2015-03-26 ENCOUNTER — Ambulatory Visit (HOSPITAL_BASED_OUTPATIENT_CLINIC_OR_DEPARTMENT_OTHER): Payer: Medicare Other

## 2015-03-26 ENCOUNTER — Other Ambulatory Visit (HOSPITAL_BASED_OUTPATIENT_CLINIC_OR_DEPARTMENT_OTHER): Payer: Medicare Other

## 2015-03-26 ENCOUNTER — Telehealth: Payer: Self-pay | Admitting: Hematology and Oncology

## 2015-03-26 VITALS — BP 155/79 | HR 93 | Temp 99.0°F | Resp 16 | Ht 65.0 in | Wt 279.5 lb

## 2015-03-26 VITALS — BP 157/73 | HR 62 | Temp 98.0°F | Resp 21 | Ht 65.0 in | Wt 278.9 lb

## 2015-03-26 DIAGNOSIS — C7931 Secondary malignant neoplasm of brain: Secondary | ICD-10-CM

## 2015-03-26 DIAGNOSIS — Z5111 Encounter for antineoplastic chemotherapy: Secondary | ICD-10-CM

## 2015-03-26 DIAGNOSIS — H5789 Other specified disorders of eye and adnexa: Secondary | ICD-10-CM

## 2015-03-26 DIAGNOSIS — H578 Other specified disorders of eye and adnexa: Secondary | ICD-10-CM

## 2015-03-26 DIAGNOSIS — C3492 Malignant neoplasm of unspecified part of left bronchus or lung: Secondary | ICD-10-CM | POA: Diagnosis not present

## 2015-03-26 DIAGNOSIS — C801 Malignant (primary) neoplasm, unspecified: Secondary | ICD-10-CM

## 2015-03-26 DIAGNOSIS — R5382 Chronic fatigue, unspecified: Secondary | ICD-10-CM | POA: Insufficient documentation

## 2015-03-26 DIAGNOSIS — R5383 Other fatigue: Secondary | ICD-10-CM

## 2015-03-26 LAB — URINALYSIS, MICROSCOPIC - CHCC
Bilirubin (Urine): NEGATIVE
Blood: NEGATIVE
Glucose: NEGATIVE mg/dL
Ketones: NEGATIVE mg/dL
Leukocyte Esterase: NEGATIVE
NITRITE: NEGATIVE
PROTEIN: NEGATIVE mg/dL
SPECIFIC GRAVITY, URINE: 1.01 (ref 1.003–1.035)
UROBILINOGEN UR: 0.2 mg/dL (ref 0.2–1)
pH: 6.5 (ref 4.6–8.0)

## 2015-03-26 LAB — CBC WITH DIFFERENTIAL/PLATELET
BASO%: 1 % (ref 0.0–2.0)
Basophils Absolute: 0.1 10*3/uL (ref 0.0–0.1)
EOS%: 1.3 % (ref 0.0–7.0)
Eosinophils Absolute: 0.1 10*3/uL (ref 0.0–0.5)
HCT: 40.3 % (ref 34.8–46.6)
HEMOGLOBIN: 13.2 g/dL (ref 11.6–15.9)
LYMPH%: 19.8 % (ref 14.0–49.7)
MCH: 31.6 pg (ref 25.1–34.0)
MCHC: 32.8 g/dL (ref 31.5–36.0)
MCV: 96.5 fL (ref 79.5–101.0)
MONO#: 0.6 10*3/uL (ref 0.1–0.9)
MONO%: 11.3 % (ref 0.0–14.0)
NEUT%: 66.6 % (ref 38.4–76.8)
NEUTROS ABS: 3.6 10*3/uL (ref 1.5–6.5)
Platelets: 205 10*3/uL (ref 145–400)
RBC: 4.18 10*6/uL (ref 3.70–5.45)
RDW: 17.3 % — ABNORMAL HIGH (ref 11.2–14.5)
WBC: 5.5 10*3/uL (ref 3.9–10.3)
lymph#: 1.1 10*3/uL (ref 0.9–3.3)

## 2015-03-26 LAB — COMPREHENSIVE METABOLIC PANEL (CC13)
ALK PHOS: 83 U/L (ref 40–150)
ALT: 18 U/L (ref 0–55)
ANION GAP: 10 meq/L (ref 3–11)
AST: 15 U/L (ref 5–34)
Albumin: 3.4 g/dL — ABNORMAL LOW (ref 3.5–5.0)
BUN: 8.9 mg/dL (ref 7.0–26.0)
CALCIUM: 9.5 mg/dL (ref 8.4–10.4)
CO2: 25 meq/L (ref 22–29)
Chloride: 110 mEq/L — ABNORMAL HIGH (ref 98–109)
Creatinine: 0.8 mg/dL (ref 0.6–1.1)
EGFR: 73 mL/min/{1.73_m2} — AB (ref >90)
Glucose: 94 mg/dl (ref 70–140)
Potassium: 3.6 mEq/L (ref 3.5–5.1)
Sodium: 146 mEq/L — ABNORMAL HIGH (ref 136–145)
Total Bilirubin: 0.24 mg/dL (ref 0.20–1.20)
Total Protein: 6.7 g/dL (ref 6.4–8.3)

## 2015-03-26 MED ORDER — CYANOCOBALAMIN 1000 MCG/ML IJ SOLN
1000.0000 ug | Freq: Once | INTRAMUSCULAR | Status: AC
  Administered 2015-03-26: 1000 ug via INTRAMUSCULAR

## 2015-03-26 MED ORDER — CYANOCOBALAMIN 1000 MCG/ML IJ SOLN
INTRAMUSCULAR | Status: AC
  Filled 2015-03-26: qty 1

## 2015-03-26 MED ORDER — SODIUM CHLORIDE 0.9 % IV SOLN
500.0000 mg/m2 | Freq: Once | INTRAVENOUS | Status: AC
  Administered 2015-03-26: 1200 mg via INTRAVENOUS
  Filled 2015-03-26: qty 48

## 2015-03-26 MED ORDER — DEXAMETHASONE SODIUM PHOSPHATE 100 MG/10ML IJ SOLN
Freq: Once | INTRAMUSCULAR | Status: AC
  Administered 2015-03-26: 14:00:00 via INTRAVENOUS
  Filled 2015-03-26: qty 4

## 2015-03-26 MED ORDER — LIDOCAINE-PRILOCAINE 2.5-2.5 % EX CREA
TOPICAL_CREAM | Freq: Once | CUTANEOUS | Status: AC
  Administered 2015-03-26: 11:00:00 via TOPICAL
  Filled 2015-03-26: qty 5

## 2015-03-26 MED ORDER — HEPARIN SOD (PORK) LOCK FLUSH 100 UNIT/ML IV SOLN
500.0000 [IU] | Freq: Once | INTRAVENOUS | Status: AC | PRN
  Administered 2015-03-26: 500 [IU]
  Filled 2015-03-26: qty 5

## 2015-03-26 MED ORDER — SODIUM CHLORIDE 0.9 % IJ SOLN
10.0000 mL | INTRAMUSCULAR | Status: DC | PRN
Start: 2015-03-26 — End: 2015-03-26
  Administered 2015-03-26: 10 mL
  Filled 2015-03-26: qty 10

## 2015-03-26 MED ORDER — SODIUM CHLORIDE 0.9 % IV SOLN
Freq: Once | INTRAVENOUS | Status: AC
  Administered 2015-03-26: 14:00:00 via INTRAVENOUS

## 2015-03-26 NOTE — Assessment & Plan Note (Signed)
This is highly unusual. I order a urinalysis which showed no proteinuria. His serum albumen is new normal. I suspect this is due to fluid retention from prednisone and recommend she stop immediately.

## 2015-03-26 NOTE — Progress Notes (Signed)
Radiation Oncology         [phone] ________________________________  Name: Caitlin Carter MRN: [id number]  Date: 03/26/2015  DOB: 1946-08-12  Follow-Up Visit Note  CC: FULP, CAMMIE, MD  Heath Lark, MD  Diagnosis:  Stage IV adenocarcinoma of the lung    Interval Since Last Radiation:  December 02, 2014 (Approximately 4 months)  Narrative:  The patient returns today for routine follow-up.  She reports having headaches over her right eye every morning. She says they go away after a few minutes. She is taking Keppra once a day and prednisone 2.5 mg every other day. She reports occasional nausea. She reports her balance is off when she is standing. She reports having an eye infections and was on Tobrex drops. She is out of the drops. She reports her eyes are itching and watering. She reports her shortness of breath is worse. She reports an occasional cough. She is having chemotherapy today and is wanting to have emla applied to her port. She forgot her emla at home. She reports her energy level is poor. The pt is scheduled to see Dr. Alvy Bimler today. Recent MRI the brain shows a very favorable response to her brain radiation treatment.                 ALLERGIES:  has No Known Allergies.  Meds: Current Outpatient Prescriptions  Medication Sig Dispense Refill  . albuterol (PROVENTIL HFA;VENTOLIN HFA) 108 (90 BASE) MCG/ACT inhaler Inhale 2 puffs into the lungs every 4 (four) hours as needed for wheezing or shortness of breath (Inhale 1-2 puffs into lungs as needed for shortness of breath.). 1 Inhaler 0  . aspirin 81 MG tablet Take 81 mg by mouth daily.    Marland Kitchen emollient (BIAFINE) cream Apply topically as needed.    . levETIRAcetam (KEPPRA) 500 MG tablet TAKE 1 TABLET BY MOUTH TWICE DAILY 60 tablet 2  . LORazepam (ATIVAN) 1 MG tablet Take 1 tablet (1 mg total) by mouth every 8 (eight) hours as needed for anxiety (or nausea). 10 tablet 0  . Melatonin 10 MG CAPS Take 10 mg by mouth at bedtime  as needed (sleep).     . ondansetron (ZOFRAN) 8 MG tablet Take 1 tablet (8 mg total) by mouth every 8 (eight) hours as needed. 60 tablet 1  . pantoprazole (PROTONIX) 40 MG tablet TAKE 1 TABLET BY MOUTH DAILY AT NOON 30 tablet 6  . PARoxetine (PAXIL) 20 MG tablet Take 20 mg by mouth daily as needed (restlessness).     . predniSONE (DELTASONE) 2.5 MG tablet Take 2.5 mg by mouth daily with breakfast.    . simvastatin (ZOCOR) 40 MG tablet Take 40 mg by mouth daily.    . folic acid (FOLVITE) 1 MG tablet     . meclizine (ANTIVERT) 12.5 MG tablet Take 1 tablet (12.5 mg total) by mouth 3 (three) times daily as needed for dizziness. (Patient not taking: Reported on 03/26/2015) 30 tablet 0  . tobramycin (TOBREX) 0.3 % ophthalmic solution Place 2 drops into both eyes every 4 (four) hours. (Patient not taking: Reported on 03/26/2015) 5 mL 0   No current facility-administered medications for this encounter.    Physical Findings: The patient is in no acute distress. Patient is alert and oriented. Quite pleasant accompanied by her sister  height is '5\' 5"'$  (1.651 m) and weight is 279 lb 8 oz (126.78 kg). Her oral temperature is 99 F (37.2 C). Her blood pressure is 155/79 and her  pulse is 93. Her respiration is 16 and oxygen saturation is 94%. . Pt reports in a wheelchair. Lungs are clear. Heart has regular r/r. Oral pharynx is clear with no evidence of thrush. Extraocular eye movement is intact with both eyelids swollen. The right eye is more swollen than the left. No obvious signs of conjunctival infection.  Lab Findings: Lab Results  Component Value Date   WBC 8.3 03/05/2015   HGB 13.5 03/05/2015   HCT 41.5 03/05/2015   MCV 99.8 03/05/2015   PLT 191 03/05/2015    Radiographic Findings: No results found.  Impression:  The patient is recovering from the effects of radiation.  Pt appears to be stable. Responding well to chemotherapy based on recent PET scan.   Plan:  Pt will continue her chemo  treatments . We will apply emla to her port. She is to see Dr. Alvy Bimler later today. When necessary follow-up in radiation oncology   This document serves as a record of services personally performed by Gery Pray, MD. It was created on his behalf by Darcus Austin, a trained medical scribe. The creation of this record is based on the scribe's personal observations and the provider's statements to them. This document has been checked and approved by the attending provider. ____________________________________ Gery Pray, MD

## 2015-03-26 NOTE — Telephone Encounter (Signed)
Pt confirmed labs/ov per 07/14 POF, gave pt AVS and Calendar, sent msg to add chemo

## 2015-03-26 NOTE — Progress Notes (Signed)
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   11/05/2014 Imaging Hypermetabolic mediastinal, left hilar and left neck adenopathy with a mildly hypermetabolic left lower lobe nodule as well as rectal activity suspect benign in the rectal area    11/10/2014 Pathology Results Accession: [id number] US biopsy confimed metastatic adenocarcinoma of lung primary   11/14/2014 - 12/02/2014 Radiation Therapy She received treatment with whole brain radiation.   12/11/2014 - 02/12/2015 Chemotherapy she received 4 cycles of carboplatin and Alimta   02/11/2015 Imaging Repeat MRI and PET CT scan show excellent response to treatment    INTERVAL HISTORY: Please see below for problem oriented charting. She returns for further follow-up. She complained of profound fatigue. She noted significant periorbital edema recently but it does not affect her eyesight. She denies any chest pain or shortness of breath. No nausea or vomiting.  REVIEW OF SYSTEMS:   Constitutional: Denies fevers, chills or abnormal weight loss Eyes: Denies blurriness of vision Ears, nose, mouth, throat, and face: Denies mucositis or sore throat Respiratory: Denies cough, dyspnea or wheezes Cardiovascular: Denies palpitation, chest discomfort or lower extremity swelling Gastrointestinal:  Denies nausea, heartburn or change in bowel habits Skin: Denies abnormal skin rashes Lymphatics: Denies  new lymphadenopathy or easy bruising Neurological:Denies numbness, tingling or new weaknesses Behavioral/Psych: Mood is stable, no new changes  All other systems were reviewed with the patient and are negative.  I have reviewed the past medical history, past surgical history, social history and family history with the patient and they are unchanged from previous note.  ALLERGIES:  has No Known Allergies.  MEDICATIONS:  Current Outpatient Prescriptions  Medication Sig Dispense Refill  . albuterol (PROVENTIL HFA;VENTOLIN HFA) 108 (90 BASE) MCG/ACT inhaler Inhale 2 puffs into the lungs every 4 (four) hours as needed for wheezing or shortness of breath (Inhale 1-2 puffs into lungs as needed for shortness of breath.). 1 Inhaler 0  . aspirin 81 MG tablet Take 81 mg by mouth daily.    Marland Kitchen emollient (BIAFINE) cream Apply topically as needed.    . folic acid (FOLVITE) 1 MG tablet     . levETIRAcetam (KEPPRA) 500 MG tablet TAKE 1 TABLET BY MOUTH TWICE DAILY 60 tablet 2  . LORazepam (ATIVAN) 1 MG tablet Take 1 tablet (1 mg total) by mouth every 8 (eight) hours as needed for anxiety (or nausea). 10 tablet 0  . meclizine (ANTIVERT) 12.5 MG tablet Take 1 tablet (12.5 mg total) by mouth 3 (three) times daily as needed for dizziness. 30 tablet 0  . Melatonin 10 MG CAPS Take 10 mg by mouth at bedtime as needed (sleep).     . ondansetron (ZOFRAN) 8 MG tablet Take 1 tablet (8 mg total) by mouth every 8 (eight) hours as needed. 60 tablet 1  . pantoprazole (PROTONIX) 40 MG tablet TAKE 1 TABLET BY MOUTH DAILY AT NOON 30 tablet 6  . PARoxetine (PAXIL) 20 MG tablet Take  20 mg by mouth daily as needed (restlessness).     . predniSONE (DELTASONE) 2.5 MG tablet Take 2.5 mg by mouth daily with breakfast.    . simvastatin (ZOCOR) 40 MG tablet Take 40 mg by mouth daily.    Marland Kitchen tobramycin (TOBREX) 0.3 % ophthalmic solution Place 2 drops into both eyes every 4 (four) hours. (Patient not taking: Reported on 03/26/2015) 5 mL 0    No current facility-administered medications for this visit.   Facility-Administered Medications Ordered in Other Visits  Medication Dose Route Frequency Provider Last Rate Last Dose  . sodium chloride 0.9 % injection 10 mL  10 mL Intracatheter PRN Heath Lark, MD   10 mL at 03/26/15 1512    PHYSICAL EXAMINATION: ECOG PERFORMANCE STATUS: 2 - Symptomatic, <50% confined to bed  Filed Vitals:   03/26/15 1153  BP: 157/73  Pulse: 62  Temp: 98 F (36.7 C)  Resp: 21   Filed Weights   03/26/15 1153  Weight: 278 lb 14.4 oz (126.508 kg)    GENERAL:alert, no distress and comfortable SKIN: skin color, texture, turgor are normal, no rashes or significant lesions EYES: normal, Conjunctiva are pink and non-injected, sclera clear. Noted periorbital edema OROPHARYNX:no exudate, no erythema and lips, buccal mucosa, and tongue normal  NECK: supple, thyroid normal size, non-tender, without nodularity LYMPH:  no palpable lymphadenopathy in the cervical, axillary or inguinal LUNGS: clear to auscultation and percussion with normal breathing effort HEART: regular rate & rhythm and no murmurs with trace bilateral lower extremity edema ABDOMEN:abdomen soft, non-tender and normal bowel sounds Musculoskeletal:no cyanosis of digits and no clubbing  NEURO: alert & oriented x 3 with fluent speech, no focal motor/sensory deficits  LABORATORY DATA:  I have reviewed the data as listed    Component Value Date/Time   NA 146* 03/26/2015 1136   NA 142 12/09/2014 1204   K 3.6 03/26/2015 1136   K 2.9* 12/09/2014 1204   CL 105 12/09/2014 1204   CO2 25 03/26/2015 1136   CO2 25 12/09/2014 1204   GLUCOSE 94 03/26/2015 1136   GLUCOSE 83 12/09/2014 1204   BUN 8.9 03/26/2015 1136   BUN 17 12/09/2014 1204   CREATININE 0.8 03/26/2015 1136   CREATININE 0.78 12/09/2014 1204   CALCIUM 9.5 03/26/2015 1136   CALCIUM 9.1 12/09/2014 1204   PROT 6.7 03/26/2015 1136   PROT 6.5 12/09/2014 1204   ALBUMIN 3.4*  03/26/2015 1136   ALBUMIN 3.5 12/09/2014 1204   AST 15 03/26/2015 1136   AST 18 12/09/2014 1204   ALT 18 03/26/2015 1136   ALT 28 12/09/2014 1204   ALKPHOS 83 03/26/2015 1136   ALKPHOS 61 12/09/2014 1204   BILITOT 0.24 03/26/2015 1136   BILITOT 0.5 12/09/2014 1204   GFRNONAA 84* 12/09/2014 1204   GFRAA >90 12/09/2014 1204    No results found for: SPEP, UPEP  Lab Results  Component Value Date   WBC 5.5 03/26/2015   NEUTROABS 3.6 03/26/2015   HGB 13.2 03/26/2015   HCT 40.3 03/26/2015   MCV 96.5 03/26/2015   PLT 205 03/26/2015      Chemistry      Component Value Date/Time   NA 146* 03/26/2015 1136   NA 142 12/09/2014 1204   K 3.6 03/26/2015 1136   K 2.9* 12/09/2014 1204   CL 105 12/09/2014 1204   CO2 25 03/26/2015 1136   CO2 25 12/09/2014 1204   BUN 8.9 03/26/2015 1136   BUN 17 12/09/2014 1204   CREATININE 0.8  03/26/2015 1136   CREATININE 0.78 12/09/2014 1204      Component Value Date/Time   CALCIUM 9.5 03/26/2015 1136   CALCIUM 9.1 12/09/2014 1204   ALKPHOS 83 03/26/2015 1136   ALKPHOS 61 12/09/2014 1204   AST 15 03/26/2015 1136   AST 18 12/09/2014 1204   ALT 18 03/26/2015 1136   ALT 28 12/09/2014 1204   BILITOT 0.24 03/26/2015 1136   BILITOT 0.5 12/09/2014 1204     ASSESSMENT & PLAN:  Metastatic adenocarcinoma to brain She complained of profound fatigue which could very well be related to side-effects from recent treatment. She had completed 4 cycles of combination carboplatin and Alimta and starting today, and was recently switched to Alimta maintenance therapy. I'm concerned due to her poor performance status and poor tolerance to treatment, I want to make sure that the treatment is working. I plan to repeat PET CT scan next month before I see her back.  Metastatic cancer to brain She tolerated radiation well apart from some memory loss. MRI show mild swelling but no evidence of new brain metastasis. I recommend she reduce Keppra to 500 mg daily and  continue prednisone taper with plans to stop prednisone       Chronic fatigue This is likely related to deconditioning and side effects from treatment. Recent thyroid function test was normal. I encouraged the patient graduated exercise as tolerated.  Periorbital swelling This is highly unusual. I order a urinalysis which showed no proteinuria. His serum albumen is new normal. I suspect this is due to fluid retention from prednisone and recommend she stop immediately.   Orders Placed This Encounter  Procedures  . NM PET Image Restag (PS) Skull Base To Thigh    Standing Status: Future     Number of Occurrences:      Standing Expiration Date: 05/25/2016    Order Specific Question:  Reason for Exam (SYMPTOM  OR DIAGNOSIS REQUIRED)    Answer:  staging lung ca, diffuse edema, SOB, assess response to Rx    Order Specific Question:  Preferred imaging location?    Answer:  [facility]  . Urinalysis, Microscopic - [facility]    Standing Status: Future     Number of Occurrences: 1     Standing Expiration Date: 04/29/2016  . TSH    Standing Status: Future     Number of Occurrences:      Standing Expiration Date: 04/29/2016   All questions were answered. The patient knows to call the clinic with any problems, questions or concerns. No barriers to learning was detected. I spent 30 minutes counseling the patient face to face. The total time spent in the appointment was 40 minutes and more than 50% was on counseling and review of test results     [facility], [place], MD 03/26/2015 5:26 PM

## 2015-03-26 NOTE — Progress Notes (Addendum)
Lucilla Edin here for follow up.  She reports having headaches over her right eye every morning.  She says they go away after a few minutes.  She is taking Keppra once a day and prednisone 25.5 mg every other day.  She reports occasional nausea.  She reports her balance is off when she is standing.  She reports having an eye infections and was on Tobrex drops.  She is out of the drops.  She reports her eyes are itching and watering.  Her eyes appear to be swollen, with the right worse than the left.  She reports her shortness of breath is worse.  She reports an occasional cough.  She is having chemotherapy today and is wanting to have emla applied to her port.  She forgot her emla at home.  She reports her energy level is poor.  BP 155/79 mmHg  Pulse 93  Temp(Src) 99 F (37.2 C) (Oral)  Resp 16  Ht '5\' 5"'$  (1.651 m)  Wt 279 lb 8 oz (126.78 kg)  BMI 46.51 kg/m2  SpO2 94%

## 2015-03-26 NOTE — Patient Instructions (Signed)
[facility] Discharge Instructions for Patients Receiving Chemotherapy  Today you received the following chemotherapy agents Alimta  To help prevent nausea and vomiting after your treatment, we encourage you to take your nausea medication   If you develop nausea and vomiting that is not controlled by your nausea medication, call the clinic.   BELOW ARE SYMPTOMS THAT SHOULD BE REPORTED IMMEDIATELY:  *FEVER GREATER THAN 100.5 F  *CHILLS WITH OR WITHOUT FEVER  NAUSEA AND VOMITING THAT IS NOT CONTROLLED WITH YOUR NAUSEA MEDICATION  *UNUSUAL SHORTNESS OF BREATH  *UNUSUAL BRUISING OR BLEEDING  TENDERNESS IN MOUTH AND THROAT WITH OR WITHOUT PRESENCE OF ULCERS  *URINARY PROBLEMS  *BOWEL PROBLEMS  UNUSUAL RASH Items with * indicate a potential emergency and should be followed up as soon as possible.  Feel free to call the clinic you have any questions or concerns. The clinic phone number is [phone].  Please show the CHEMO ALERT CARD at check-in to the Emergency Department and triage nurse.   

## 2015-03-26 NOTE — Assessment & Plan Note (Signed)
She tolerated radiation well apart from some memory loss. MRI show mild swelling but no evidence of new brain metastasis. I recommend she reduce Keppra to 500 mg daily and continue prednisone taper with plans to stop prednisone

## 2015-03-26 NOTE — Assessment & Plan Note (Signed)
This is likely related to deconditioning and side effects from treatment. Recent thyroid function test was normal. I encouraged the patient graduated exercise as tolerated.

## 2015-03-26 NOTE — Assessment & Plan Note (Addendum)
She complained of profound fatigue which could very well be related to side-effects from recent treatment. She had completed 4 cycles of combination carboplatin and Alimta and starting today, and was recently switched to Alimta maintenance therapy. I'm concerned due to her poor performance status and poor tolerance to treatment, I want to make sure that the treatment is working. I plan to repeat PET CT scan next month before I see her back.

## 2015-03-28 ENCOUNTER — Other Ambulatory Visit: Payer: Self-pay | Admitting: Hematology and Oncology

## 2015-04-01 ENCOUNTER — Telehealth: Payer: Self-pay | Admitting: Medical Oncology

## 2015-04-01 NOTE — Telephone Encounter (Signed)
Pt asking if she can wear perfume, shave her legs, get pedicure, manicure,. I instructed pt to not get a pedicure or artificial nails. She can get her toes and nails polished and filed . I told her she can shave her legs ,but to be careful with getting cuts and to use antibiotic ointment for any cuts.  She voices understanding

## 2015-04-10 ENCOUNTER — Other Ambulatory Visit: Payer: Self-pay | Admitting: Hematology and Oncology

## 2015-04-10 ENCOUNTER — Telehealth: Payer: Self-pay

## 2015-04-10 DIAGNOSIS — R0602 Shortness of breath: Secondary | ICD-10-CM

## 2015-04-10 DIAGNOSIS — R600 Localized edema: Secondary | ICD-10-CM

## 2015-04-10 MED ORDER — PREDNISONE 20 MG PO TABS: 20.0000 mg | ORAL_TABLET | Freq: Every day | ORAL | Status: DC

## 2015-04-10 NOTE — Telephone Encounter (Signed)
Please call prescription 20 mg prednisone daily, 30 tabs no refills I will reassess next week as scheduled

## 2015-04-10 NOTE — Addendum Note (Signed)
Addended by: Janace Hoard on: 04/10/2015 11:28 AM   Modules accepted: Orders

## 2015-04-10 NOTE — Telephone Encounter (Signed)
Dr Marica Otter is optometrist seeing the pt. The pt has a lot of periorbital edema. Dr Sabra Heck stated the ey exam was OK except for this. She does not know any cause for the edema. She advised a systemic approach to the edema using benadryl or something else. Dr Sabra Heck stated the pt did use benadryl without any results. What do we suggest? Does Dr Alvy Bimler have any suggestions for the cause of the edema. Dr Sabra Heck is on call and can be reached until 4pm. Her cell [phone]

## 2015-04-10 NOTE — Telephone Encounter (Signed)
S/w pt and she stated that the benadryl actually made her periorbital edema worse. Discussed message from Dr Alvy Bimler re: prescription for prednisone. Pt spoke back "one per day"

## 2015-04-15 ENCOUNTER — Other Ambulatory Visit: Payer: Medicare Other

## 2015-04-15 ENCOUNTER — Other Ambulatory Visit (HOSPITAL_BASED_OUTPATIENT_CLINIC_OR_DEPARTMENT_OTHER): Payer: Medicare Other

## 2015-04-15 ENCOUNTER — Ambulatory Visit (HOSPITAL_COMMUNITY)
Admission: RE | Admit: 2015-04-15 | Discharge: 2015-04-15 | Disposition: A | Discharge status: Home / Self Care | Payer: Medicare Other | Source: Ambulatory Visit | Attending: Hematology and Oncology | Admitting: Hematology and Oncology

## 2015-04-15 ENCOUNTER — Encounter (HOSPITAL_COMMUNITY): Payer: Self-pay

## 2015-04-15 DIAGNOSIS — R5383 Other fatigue: Secondary | ICD-10-CM

## 2015-04-15 DIAGNOSIS — E876 Hypokalemia: Secondary | ICD-10-CM | POA: Diagnosis not present

## 2015-04-15 DIAGNOSIS — I700 Atherosclerosis of aorta: Secondary | ICD-10-CM | POA: Insufficient documentation

## 2015-04-15 DIAGNOSIS — R0602 Shortness of breath: Secondary | ICD-10-CM | POA: Diagnosis not present

## 2015-04-15 DIAGNOSIS — I5032 Chronic diastolic (congestive) heart failure: Secondary | ICD-10-CM | POA: Diagnosis not present

## 2015-04-15 DIAGNOSIS — D6959 Other secondary thrombocytopenia: Secondary | ICD-10-CM

## 2015-04-15 DIAGNOSIS — I2510 Atherosclerotic heart disease of native coronary artery without angina pectoris: Secondary | ICD-10-CM | POA: Diagnosis not present

## 2015-04-15 DIAGNOSIS — R911 Solitary pulmonary nodule: Secondary | ICD-10-CM | POA: Insufficient documentation

## 2015-04-15 DIAGNOSIS — C3492 Malignant neoplasm of unspecified part of left bronchus or lung: Secondary | ICD-10-CM | POA: Diagnosis not present

## 2015-04-15 DIAGNOSIS — C7931 Secondary malignant neoplasm of brain: Secondary | ICD-10-CM | POA: Insufficient documentation

## 2015-04-15 DIAGNOSIS — C3490 Malignant neoplasm of unspecified part of unspecified bronchus or lung: Secondary | ICD-10-CM | POA: Diagnosis present

## 2015-04-15 LAB — COMPREHENSIVE METABOLIC PANEL (CC13)
ALBUMIN: 3.6 g/dL (ref 3.5–5.0)
ALK PHOS: 89 U/L (ref 40–150)
ALT: 18 U/L (ref 0–55)
AST: 13 U/L (ref 5–34)
Anion Gap: 8 mEq/L (ref 3–11)
BILIRUBIN TOTAL: 0.33 mg/dL (ref 0.20–1.20)
BUN: 11 mg/dL (ref 7.0–26.0)
CALCIUM: 9.8 mg/dL (ref 8.4–10.4)
CO2: 25 mEq/L (ref 22–29)
CREATININE: 0.8 mg/dL (ref 0.6–1.1)
Chloride: 111 mEq/L — ABNORMAL HIGH (ref 98–109)
EGFR: 80 mL/min/{1.73_m2} — ABNORMAL LOW (ref >90)
Glucose: 100 mg/dl (ref 70–140)
Potassium: 3.7 mEq/L (ref 3.5–5.1)
Sodium: 144 mEq/L (ref 136–145)
Total Protein: 7.2 g/dL (ref 6.4–8.3)

## 2015-04-15 LAB — CBC WITH DIFFERENTIAL/PLATELET
BASO%: 0.7 % (ref 0.0–2.0)
Basophils Absolute: 0.1 10*3/uL (ref 0.0–0.1)
EOS ABS: 0 10*3/uL (ref 0.0–0.5)
EOS%: 0.6 % (ref 0.0–7.0)
HEMATOCRIT: 42.4 % (ref 34.8–46.6)
HGB: 14 g/dL (ref 11.6–15.9)
LYMPH%: 10.3 % — ABNORMAL LOW (ref 14.0–49.7)
MCH: 31.2 pg (ref 25.1–34.0)
MCHC: 32.9 g/dL (ref 31.5–36.0)
MCV: 94.7 fL (ref 79.5–101.0)
MONO#: 0.7 10*3/uL (ref 0.1–0.9)
MONO%: 8.1 % (ref 0.0–14.0)
NEUT%: 80.3 % — ABNORMAL HIGH (ref 38.4–76.8)
NEUTROS ABS: 6.7 10*3/uL — AB (ref 1.5–6.5)
PLATELETS: 203 10*3/uL (ref 145–400)
RBC: 4.48 10*6/uL (ref 3.70–5.45)
RDW: 16.3 % — ABNORMAL HIGH (ref 11.2–14.5)
WBC: 8.3 10*3/uL (ref 3.9–10.3)
lymph#: 0.9 10*3/uL (ref 0.9–3.3)

## 2015-04-15 LAB — TSH CHCC: TSH: 0.939 m(IU)/L (ref 0.308–3.960)

## 2015-04-15 LAB — BRAIN NATRIURETIC PEPTIDE: Brain Natriuretic Peptide: 147.9 pg/mL — ABNORMAL HIGH (ref 0.0–100.0)

## 2015-04-15 LAB — GLUCOSE, CAPILLARY: Glucose-Capillary: 107 mg/dL — ABNORMAL HIGH (ref 65–99)

## 2015-04-15 MED ORDER — FLUDEOXYGLUCOSE F - 18 (FDG) INJECTION
13.9300 | Freq: Once | INTRAVENOUS | Status: AC | PRN
  Administered 2015-04-15: 13.93 via INTRAVENOUS

## 2015-04-16 ENCOUNTER — Telehealth: Payer: Self-pay | Admitting: *Deleted

## 2015-04-16 ENCOUNTER — Encounter: Payer: Self-pay | Admitting: Hematology and Oncology

## 2015-04-16 ENCOUNTER — Ambulatory Visit (HOSPITAL_BASED_OUTPATIENT_CLINIC_OR_DEPARTMENT_OTHER): Payer: Medicare Other | Admitting: Hematology and Oncology

## 2015-04-16 ENCOUNTER — Telehealth: Payer: Self-pay | Admitting: Hematology and Oncology

## 2015-04-16 ENCOUNTER — Ambulatory Visit (HOSPITAL_BASED_OUTPATIENT_CLINIC_OR_DEPARTMENT_OTHER): Payer: Medicare Other

## 2015-04-16 VITALS — BP 168/79 | HR 85 | Temp 98.0°F | Resp 18 | Ht 65.0 in | Wt 276.6 lb

## 2015-04-16 DIAGNOSIS — R110 Nausea: Secondary | ICD-10-CM

## 2015-04-16 DIAGNOSIS — I10 Essential (primary) hypertension: Secondary | ICD-10-CM

## 2015-04-16 DIAGNOSIS — R5382 Chronic fatigue, unspecified: Secondary | ICD-10-CM

## 2015-04-16 DIAGNOSIS — Z5111 Encounter for antineoplastic chemotherapy: Secondary | ICD-10-CM | POA: Diagnosis not present

## 2015-04-16 DIAGNOSIS — C3492 Malignant neoplasm of unspecified part of left bronchus or lung: Secondary | ICD-10-CM | POA: Diagnosis not present

## 2015-04-16 DIAGNOSIS — R220 Localized swelling, mass and lump, head: Secondary | ICD-10-CM

## 2015-04-16 DIAGNOSIS — T451X5A Adverse effect of antineoplastic and immunosuppressive drugs, initial encounter: Secondary | ICD-10-CM

## 2015-04-16 DIAGNOSIS — C7931 Secondary malignant neoplasm of brain: Secondary | ICD-10-CM

## 2015-04-16 DIAGNOSIS — H5789 Other specified disorders of eye and adnexa: Secondary | ICD-10-CM

## 2015-04-16 MED ORDER — ONDANSETRON HCL 40 MG/20ML IJ SOLN
Freq: Once | INTRAMUSCULAR | Status: AC
  Administered 2015-04-16: 14:00:00 via INTRAVENOUS
  Filled 2015-04-16: qty 4

## 2015-04-16 MED ORDER — SODIUM CHLORIDE 0.9 % IJ SOLN
10.0000 mL | INTRAMUSCULAR | Status: DC | PRN
Start: 2015-04-16 — End: 2015-04-16
  Administered 2015-04-16: 10 mL
  Filled 2015-04-16: qty 10

## 2015-04-16 MED ORDER — FUROSEMIDE 20 MG PO TABS: 20.0000 mg | ORAL_TABLET | Freq: Every day | ORAL | Status: DC

## 2015-04-16 MED ORDER — SODIUM CHLORIDE 0.9 % IV SOLN
500.0000 mg/m2 | Freq: Once | INTRAVENOUS | Status: AC
  Administered 2015-04-16: 1200 mg via INTRAVENOUS
  Filled 2015-04-16: qty 48

## 2015-04-16 MED ORDER — HEPARIN SOD (PORK) LOCK FLUSH 100 UNIT/ML IV SOLN
500.0000 [IU] | Freq: Once | INTRAVENOUS | Status: AC | PRN
  Administered 2015-04-16: 500 [IU]
  Filled 2015-04-16: qty 5

## 2015-04-16 MED ORDER — SODIUM CHLORIDE 0.9 % IV SOLN
Freq: Once | INTRAVENOUS | Status: AC
  Administered 2015-04-16: 14:00:00 via INTRAVENOUS

## 2015-04-16 NOTE — Assessment & Plan Note (Signed)
She tolerated radiation well apart from some memory loss. MRI from June show mild swelling but no evidence of new brain metastasis. I recommend she reduce Keppra to 500 mg daily and continue prednisone taper with plans to stop prednisone

## 2015-04-16 NOTE — Assessment & Plan Note (Addendum)
Continue anti-emetics as needed.

## 2015-04-16 NOTE — Assessment & Plan Note (Signed)
She complained of profound fatigue which could very well be related to side-effects from recent treatment. She had completed 4 cycles of combination carboplatin and Alimta and was recently switched to Alimta maintenance therapy. I'm concerned due to her poor performance status and poor tolerance to treatment, I want to make sure that the treatment is working. Thankfully, PET CT scan showed near complete response to treatment. We discussed the role of maintenance therapy and she wants to proceed. I will see her back in 3 weeks for further assessment. I suspect a decline in performance status is due to an element of deconditioning.

## 2015-04-16 NOTE — Progress Notes (Signed)
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   12/11/2014 - 02/12/2015 Chemotherapy she received 4 cycles of carboplatin and Alimta   02/11/2015 Imaging Repeat MRI and PET CT scan show excellent response to treatment   04/15/2015 Imaging  repeat PET CT scan showed near complete response to treatment.    INTERVAL HISTORY: Please see below for problem oriented charting. She is seen prior to chemotherapy today. She continues to have fatigue, poor appetite and periorbital swelling. Denies recent seizure. She is not motivated to exercise. She has some mild nausea but no vomiting. Her hair has started to grow back. She denies pain.  REVIEW OF SYSTEMS:   Constitutional: Denies fevers, chills or abnormal weight loss Eyes: Denies blurriness of vision Ears, nose, mouth, throat, and face: Denies mucositis or sore throat Respiratory: Denies cough, dyspnea or wheezes Cardiovascular: Denies palpitation, chest discomfort or lower extremity swelling Skin:  Denies abnormal skin rashes Lymphatics: Denies new lymphadenopathy or easy bruising Behavioral/Psych: Mood is stable, no new changes  All other systems were reviewed with the patient and are negative.  I have reviewed the past medical history, past surgical history, social history and family history with the patient and they are unchanged from previous note.  ALLERGIES:  has No Known Allergies.  MEDICATIONS:  Current Outpatient Prescriptions  Medication Sig Dispense Refill  . albuterol (PROVENTIL HFA;VENTOLIN HFA) 108 (90 BASE) MCG/ACT inhaler Inhale 2 puffs into the lungs every 4 (four) hours as needed for wheezing or shortness of breath (Inhale 1-2 puffs into lungs as needed for shortness of breath.). 1 Inhaler 0  . aspirin 81 MG tablet Take 81 mg by mouth daily.    Marland Kitchen emollient (BIAFINE) cream Apply topically as needed.    . folic acid (FOLVITE) 1 MG tablet     . levETIRAcetam (KEPPRA) 500 MG tablet Take 500 mg by mouth once.    Marland Kitchen LORazepam (ATIVAN) 1 MG tablet Take 1 tablet (1 mg total) by mouth every 8 (eight) hours as needed for anxiety (or nausea). 10 tablet 0  . meclizine (ANTIVERT) 12.5 MG tablet Take 1 tablet (12.5 mg total) by mouth 3 (three) times daily as needed for dizziness. 30 tablet 0  . Melatonin 10 MG CAPS Take 10 mg by mouth at bedtime as needed (sleep).     . ondansetron (ZOFRAN) 8 MG tablet Take 1 tablet (8 mg total) by mouth every 8 (eight) hours as needed. 60 tablet 1  . pantoprazole (PROTONIX) 40 MG tablet TAKE 1 TABLET BY MOUTH DAILY AT NOON 30 tablet 6  . PARoxetine (  PAXIL) 20 MG tablet Take 20 mg by mouth daily as needed (restlessness).     . predniSONE (DELTASONE) 20 MG tablet Take 1 tablet (20 mg total) by mouth daily with breakfast. 30 tablet 0  . simvastatin (ZOCOR) 40 MG tablet Take 40 mg by mouth daily.    Marland Kitchen tobramycin (TOBREX) 0.3 % ophthalmic solution Place 2 drops into both eyes every 4 (four) hours. 5 mL 0  . furosemide (LASIX) 20 MG tablet Take 1 tablet  (20 mg total) by mouth daily. 30 tablet 0   No current facility-administered medications for this visit.    PHYSICAL EXAMINATION: ECOG PERFORMANCE STATUS: 2 - Symptomatic, <50% confined to bed  Filed Vitals:   04/16/15 1224  BP: 168/79  Pulse: 85  Temp: 98 F (36.7 C)  Resp: 18   Filed Weights   04/16/15 1224  Weight: 276 lb 9.6 oz (125.465 kg)    GENERAL:alert, no distress and comfortable. She is obese SKIN: skin color, texture, turgor are normal, no rashes or significant lesions EYES: normal, Conjunctiva are pink and non-injected, sclera clear. There is periorbital edema OROPHARYNX:no exudate, no erythema and lips, buccal mucosa, and tongue normal  NECK: supple, thyroid normal size, non-tender, without nodularity LYMPH:  no palpable lymphadenopathy in the cervical, axillary or inguinal LUNGS: clear to auscultation and percussion with normal breathing effort HEART: regular rate & rhythm and no murmurs and no lower extremity edema ABDOMEN:abdomen soft, non-tender and normal bowel sounds Musculoskeletal:no cyanosis of digits and no clubbing  NEURO: alert & oriented x 3 with fluent speech, no focal motor/sensory deficits  LABORATORY DATA:  I have reviewed the data as listed    Component Value Date/Time   NA 144 04/15/2015 0810   NA 142 12/09/2014 1204   K 3.7 04/15/2015 0810   K 2.9* 12/09/2014 1204   CL 105 12/09/2014 1204   CO2 25 04/15/2015 0810   CO2 25 12/09/2014 1204   GLUCOSE 100 04/15/2015 0810   GLUCOSE 83 12/09/2014 1204   BUN 11.0 04/15/2015 0810   BUN 17 12/09/2014 1204   CREATININE 0.8 04/15/2015 0810   CREATININE 0.78 12/09/2014 1204   CALCIUM 9.8 04/15/2015 0810   CALCIUM 9.1 12/09/2014 1204   PROT 7.2 04/15/2015 0810   PROT 6.5 12/09/2014 1204   ALBUMIN 3.6 04/15/2015 0810   ALBUMIN 3.5 12/09/2014 1204   AST 13 04/15/2015 0810   AST 18 12/09/2014 1204   ALT 18 04/15/2015 0810   ALT 28 12/09/2014 1204   ALKPHOS 89 04/15/2015 0810   ALKPHOS 61  12/09/2014 1204   BILITOT 0.33 04/15/2015 0810   BILITOT 0.5 12/09/2014 1204   GFRNONAA 84* 12/09/2014 1204   GFRAA >90 12/09/2014 1204    No results found for: SPEP, UPEP  Lab Results  Component Value Date   WBC 8.3 04/15/2015   NEUTROABS 6.7* 04/15/2015   HGB 14.0 04/15/2015   HCT 42.4 04/15/2015   MCV 94.7 04/15/2015   PLT 203 04/15/2015      Chemistry      Component Value Date/Time   NA 144 04/15/2015 0810   NA 142 12/09/2014 1204   K 3.7 04/15/2015 0810   K 2.9* 12/09/2014 1204   CL 105 12/09/2014 1204   CO2 25 04/15/2015 0810   CO2 25 12/09/2014 1204   BUN 11.0 04/15/2015 0810   BUN 17 12/09/2014 1204   CREATININE 0.8 04/15/2015 0810   CREATININE 0.78 12/09/2014 1204      Component Value Date/Time  CALCIUM 9.8 04/15/2015 0810   CALCIUM 9.1 12/09/2014 1204   ALKPHOS 89 04/15/2015 0810   ALKPHOS 61 12/09/2014 1204   AST 13 04/15/2015 0810   AST 18 12/09/2014 1204   ALT 18 04/15/2015 0810   ALT 28 12/09/2014 1204   BILITOT 0.33 04/15/2015 0810   BILITOT 0.5 12/09/2014 1204       RADIOGRAPHIC STUDIES: I have personally reviewed the radiological images as listed and agreed with the findings in the report. 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  EXAM: NUCLEAR MEDICINE PET SKULL BASE TO THIGH  TECHNIQUE: 13.9 mCi F-18 FDG was injected intravenously. Full-ring PET imaging was performed from the skull base to thigh after the radiotracer. CT data was obtained and used for attenuation correction and anatomic localization.  FASTING BLOOD GLUCOSE:  Value: 107 mg/dl  COMPARISON:  02/11/2015  FINDINGS: NECK  No hypermetabolic lymph nodes in the neck. Resolution of previous left level to index lymph node.  CHEST  Resolution of previous index pre-vascular and sub- carinal lymph nodes. No new hypermetabolic lymph nodes identified. Aortic atherosclerosis identified.  Calcifications within the RCA and LAD Coronary artery noted. Index lesion adjacent to the descending thoracic aorta measures 9 mm and has an SUV max equal to 2.8. Previously this measured the same within SUV max equal to 3.6. No suspicious pulmonary nodules on the CT scan.  ABDOMEN/PELVIS  No abnormal hypermetabolic activity within the liver, pancreas, adrenal glands, or spleen. No hypermetabolic lymph nodes in the abdomen or pelvis.  SKELETON  No focal hypermetabolic activity to suggest skeletal metastasis.  IMPRESSION: 1. Continued interval response to therapy. 2. Interval resolution of previous index lymph nodes within the neck and chest. 3. Residual nodule within the posterior medial left lower lobe exhibits mild uptake, improved from previous exam. 4. No new or progressive disease identified. 5. Aortic atherosclerosis.   Electronically Signed   By: Kerby Moors M.D.   On: 04/15/2015 11:29     ASSESSMENT & PLAN:  Metastatic adenocarcinoma to brain She complained of profound fatigue which could very well be related to side-effects from recent treatment. She had completed 4 cycles of combination carboplatin and Alimta and was recently switched to Alimta maintenance therapy. I'm concerned due to her poor performance status and poor tolerance to treatment, I want to make sure that the treatment is working. Thankfully, PET CT scan showed near complete response to treatment. We discussed the role of maintenance therapy and she wants to proceed. I will see her back in 3 weeks for further assessment. I suspect a decline in performance status is due to an element of deconditioning.  Metastatic cancer to brain She tolerated radiation well apart from some memory loss. MRI from June show mild swelling but no evidence of new brain metastasis. I recommend she reduce Keppra to 500 mg daily and continue prednisone taper with plans to stop prednisone        Chemotherapy-induced nausea Continue  anti-emetics as needed.   Chronic fatigue This is likely related to deconditioning and side effects from treatment. Recent thyroid function test was normal. I encouraged the patient graduated exercise as tolerated.   Periorbital swelling This is likely related to fluid retention from recent corticosteroids treatment. BNP is mildly elevated. I will start her on Lasix daily and wean her off prednisone.  Essential hypertension  She was taken off her blood pressure medications recently  due to recent falls and poor oral intake. Her blood pressure is not elevated. I will wean off prednisone, start her on Lasix and restart back on her blood pressure medication at half a dose. I will reassess in 3 weeks.   No orders of the defined types were placed in this encounter.   All questions were answered. The patient knows to call the clinic with any problems, questions or concerns. No barriers to learning was detected. I spent 30 minutes counseling the patient face to face. The total time spent in the appointment was 40 minutes and more than 50% was on counseling and review of test results     [facility], Mylan Lengyel, MD 04/16/2015 1:37 PM

## 2015-04-16 NOTE — Telephone Encounter (Signed)
Per staff message and POF I have scheduled appts. Advised scheduler of appts. JMW  

## 2015-04-16 NOTE — Assessment & Plan Note (Signed)
This is likely related to fluid retention from recent corticosteroids treatment. BNP is mildly elevated. I will start her on Lasix daily and wean her off prednisone.

## 2015-04-16 NOTE — Assessment & Plan Note (Signed)
She was taken off her blood pressure medications recently due to recent falls and poor oral intake. Her blood pressure is not elevated. I will wean off prednisone, start her on Lasix and restart back on her blood pressure medication at half a dose. I will reassess in 3 weeks.

## 2015-04-16 NOTE — Assessment & Plan Note (Signed)
This is likely related to deconditioning and side effects from treatment. Recent thyroid function test was normal. I encouraged the patient graduated exercise as tolerated.

## 2015-04-16 NOTE — Telephone Encounter (Signed)
Pt confirmed labs/ov per 08/04 POF, gave pt avs and calendar.... KJ, sent msg to add chemo

## 2015-04-16 NOTE — Patient Instructions (Signed)
[facility] Discharge Instructions for Patients Receiving Chemotherapy  Today you received the following chemotherapy agents; Alimta.   To help prevent nausea and vomiting after your treatment, we encourage you to take your nausea medication as directed.    If you develop nausea and vomiting that is not controlled by your nausea medication, call the clinic.   BELOW ARE SYMPTOMS THAT SHOULD BE REPORTED IMMEDIATELY:  *FEVER GREATER THAN 100.5 F  *CHILLS WITH OR WITHOUT FEVER  NAUSEA AND VOMITING THAT IS NOT CONTROLLED WITH YOUR NAUSEA MEDICATION  *UNUSUAL SHORTNESS OF BREATH  *UNUSUAL BRUISING OR BLEEDING  TENDERNESS IN MOUTH AND THROAT WITH OR WITHOUT PRESENCE OF ULCERS  *URINARY PROBLEMS  *BOWEL PROBLEMS  UNUSUAL RASH Items with * indicate a potential emergency and should be followed up as soon as possible.  Feel free to call the clinic you have any questions or concerns. The clinic phone number is [phone].  Please show the CHEMO ALERT CARD at check-in to the Emergency Department and triage nurse.   

## 2015-04-17 ENCOUNTER — Telehealth: Payer: Self-pay | Admitting: *Deleted

## 2015-04-17 MED ORDER — AMOXICILLIN 500 MG PO CAPS: 500.0000 mg | ORAL_CAPSULE | Freq: Two times a day (BID) | ORAL | Status: DC

## 2015-04-17 NOTE — Telephone Encounter (Signed)
Pt reports new sore throat and coughing started last night.  Denies fever.  Instructed Dr. Alvy Bimler ordered Amoxicillin to take one twice daily for 7 days.  Rx sent to [facility].  Call if symptoms worsen.  She verbalized understanding.

## 2015-04-20 ENCOUNTER — Other Ambulatory Visit: Payer: Self-pay | Admitting: Hematology and Oncology

## 2015-04-20 ENCOUNTER — Telehealth: Payer: Self-pay | Admitting: *Deleted

## 2015-04-20 DIAGNOSIS — C7931 Secondary malignant neoplasm of brain: Secondary | ICD-10-CM

## 2015-04-20 MED ORDER — MECLIZINE HCL 12.5 MG PO TABS: 12.5000 mg | ORAL_TABLET | Freq: Three times a day (TID) | ORAL | Status: DC | PRN

## 2015-04-20 NOTE — Telephone Encounter (Signed)
Called Caitlin Carter to inform her Dr. Alvy Bimler approved this refill.  Message left on voicemail identified as patient that refill has been approved.

## 2015-04-20 NOTE — Telephone Encounter (Signed)
This has been done.

## 2015-04-20 NOTE — Telephone Encounter (Signed)
1. Patient called asking for lasix to be called in to her Pharmacy.  Informed her this has been done this morning.  2. Also asked for help with names of medications.  "I have something for dizziness and do not know what to take."  Meclizine/Antivert 12.5 take one TID PRN is on medication list.  She does not have this on hand.  Was ordered after ED visit on 12-09-2014 for weakness and dizziness.  Instructed to contact PCP.  "Dizziness is from the chemotherapy and I do not want to see a lot of different doctors.  Will you ask Dr. Alvy Bimler to send this order in?"   Return number is [phone].

## 2015-04-20 NOTE — Telephone Encounter (Signed)
Pls proceed 30 tabs, no refills

## 2015-04-24 ENCOUNTER — Telehealth: Payer: Self-pay | Admitting: Hematology and Oncology

## 2015-04-24 NOTE — Telephone Encounter (Signed)
Called and left a message with new appointment times per inbox message from dr Alvy Bimler for a meetingi,

## 2015-04-29 ENCOUNTER — Other Ambulatory Visit: Payer: Self-pay | Admitting: Hematology and Oncology

## 2015-05-07 ENCOUNTER — Other Ambulatory Visit (HOSPITAL_BASED_OUTPATIENT_CLINIC_OR_DEPARTMENT_OTHER): Payer: Medicare Other

## 2015-05-07 ENCOUNTER — Telehealth: Payer: Self-pay | Admitting: Hematology and Oncology

## 2015-05-07 ENCOUNTER — Ambulatory Visit (HOSPITAL_BASED_OUTPATIENT_CLINIC_OR_DEPARTMENT_OTHER): Payer: Medicare Other | Admitting: Hematology and Oncology

## 2015-05-07 ENCOUNTER — Ambulatory Visit: Payer: Medicare Other | Admitting: Hematology and Oncology

## 2015-05-07 ENCOUNTER — Ambulatory Visit (HOSPITAL_BASED_OUTPATIENT_CLINIC_OR_DEPARTMENT_OTHER): Payer: Medicare Other

## 2015-05-07 ENCOUNTER — Other Ambulatory Visit: Payer: Medicare Other

## 2015-05-07 ENCOUNTER — Encounter: Payer: Self-pay | Admitting: Hematology and Oncology

## 2015-05-07 ENCOUNTER — Ambulatory Visit: Payer: Medicare Other

## 2015-05-07 VITALS — BP 134/66 | HR 76 | Temp 99.1°F | Resp 18 | Ht 65.0 in | Wt 275.3 lb

## 2015-05-07 DIAGNOSIS — R5382 Chronic fatigue, unspecified: Secondary | ICD-10-CM

## 2015-05-07 DIAGNOSIS — C7931 Secondary malignant neoplasm of brain: Secondary | ICD-10-CM

## 2015-05-07 DIAGNOSIS — E876 Hypokalemia: Secondary | ICD-10-CM

## 2015-05-07 DIAGNOSIS — H578 Other specified disorders of eye and adnexa: Secondary | ICD-10-CM

## 2015-05-07 DIAGNOSIS — C3492 Malignant neoplasm of unspecified part of left bronchus or lung: Secondary | ICD-10-CM | POA: Diagnosis not present

## 2015-05-07 DIAGNOSIS — H5789 Other specified disorders of eye and adnexa: Secondary | ICD-10-CM

## 2015-05-07 DIAGNOSIS — Z5111 Encounter for antineoplastic chemotherapy: Secondary | ICD-10-CM

## 2015-05-07 DIAGNOSIS — J438 Other emphysema: Secondary | ICD-10-CM

## 2015-05-07 DIAGNOSIS — J449 Chronic obstructive pulmonary disease, unspecified: Secondary | ICD-10-CM

## 2015-05-07 DIAGNOSIS — Z95828 Presence of other vascular implants and grafts: Secondary | ICD-10-CM

## 2015-05-07 LAB — COMPREHENSIVE METABOLIC PANEL (CC13)
ALBUMIN: 3.4 g/dL — AB (ref 3.5–5.0)
ALK PHOS: 93 U/L (ref 40–150)
ALT: 21 U/L (ref 0–55)
AST: 15 U/L (ref 5–34)
Anion Gap: 11 mEq/L (ref 3–11)
BUN: 13.6 mg/dL (ref 7.0–26.0)
CALCIUM: 9.3 mg/dL (ref 8.4–10.4)
CO2: 27 mEq/L (ref 22–29)
Chloride: 106 mEq/L (ref 98–109)
Creatinine: 0.8 mg/dL (ref 0.6–1.1)
EGFR: 77 mL/min/{1.73_m2} — AB (ref >90)
GLUCOSE: 85 mg/dL (ref 70–140)
POTASSIUM: 3.4 meq/L — AB (ref 3.5–5.1)
SODIUM: 144 meq/L (ref 136–145)
Total Bilirubin: 0.34 mg/dL (ref 0.20–1.20)
Total Protein: 6.8 g/dL (ref 6.4–8.3)

## 2015-05-07 LAB — CBC WITH DIFFERENTIAL/PLATELET
BASO%: 0.4 % (ref 0.0–2.0)
BASOS ABS: 0 10*3/uL (ref 0.0–0.1)
EOS ABS: 0.1 10*3/uL (ref 0.0–0.5)
EOS%: 1.3 % (ref 0.0–7.0)
HCT: 40.1 % (ref 34.8–46.6)
HEMOGLOBIN: 12.7 g/dL (ref 11.6–15.9)
LYMPH%: 29.3 % (ref 14.0–49.7)
MCH: 31.1 pg (ref 25.1–34.0)
MCHC: 31.7 g/dL (ref 31.5–36.0)
MCV: 98 fL (ref 79.5–101.0)
MONO#: 0.7 10*3/uL (ref 0.1–0.9)
MONO%: 12.5 % (ref 0.0–14.0)
NEUT#: 3.1 10*3/uL (ref 1.5–6.5)
NEUT%: 56.5 % (ref 38.4–76.8)
Platelets: 202 10*3/uL (ref 145–400)
RBC: 4.09 10*6/uL (ref 3.70–5.45)
RDW: 15.3 % — AB (ref 11.2–14.5)
WBC: 5.5 10*3/uL (ref 3.9–10.3)
lymph#: 1.6 10*3/uL (ref 0.9–3.3)

## 2015-05-07 MED ORDER — SODIUM CHLORIDE 0.9 % IV SOLN
Freq: Once | INTRAVENOUS | Status: AC
  Administered 2015-05-07: 15:00:00 via INTRAVENOUS

## 2015-05-07 MED ORDER — SODIUM CHLORIDE 0.9 % IJ SOLN
10.0000 mL | INTRAMUSCULAR | Status: DC | PRN
  Administered 2015-05-07: 10 mL
  Filled 2015-05-07: qty 10

## 2015-05-07 MED ORDER — SODIUM CHLORIDE 0.9 % IV SOLN
Freq: Once | INTRAVENOUS | Status: AC
  Administered 2015-05-07: 16:00:00 via INTRAVENOUS
  Filled 2015-05-07: qty 4

## 2015-05-07 MED ORDER — SODIUM CHLORIDE 0.9 % IV SOLN
500.0000 mg/m2 | Freq: Once | INTRAVENOUS | Status: AC
  Administered 2015-05-07: 1200 mg via INTRAVENOUS
  Filled 2015-05-07: qty 48

## 2015-05-07 MED ORDER — SODIUM CHLORIDE 0.9 % IJ SOLN
10.0000 mL | INTRAMUSCULAR | Status: DC | PRN
  Filled 2015-05-07: qty 10

## 2015-05-07 MED ORDER — HEPARIN SOD (PORK) LOCK FLUSH 100 UNIT/ML IV SOLN
500.0000 [IU] | Freq: Once | INTRAVENOUS | Status: AC | PRN
  Administered 2015-05-07: 500 [IU]
  Filled 2015-05-07: qty 5

## 2015-05-07 NOTE — Assessment & Plan Note (Signed)
She complained of profound fatigue which could very well be related to side-effects from recent treatment. She had completed 4 cycles of combination carboplatin and Alimta and was recently switched to Alimta maintenance therapy.Caitlin Carter, PET CT scan showed near complete response to treatment. We discussed the role of maintenance therapy and she wants to proceed. I will see her back in 3 weeks for further assessment. I suspect a decline in performance status is due to an element of deconditioning.

## 2015-05-07 NOTE — Telephone Encounter (Signed)
Gave and printed appt sched and avs for pt for Sept °

## 2015-05-07 NOTE — Assessment & Plan Note (Signed)
Her recent hypokalemia could be due to Lasix. It is mild and she is not symptomatic. I encouraged her to eat potassium rich diet.

## 2015-05-07 NOTE — Telephone Encounter (Signed)
Gave adn printed appt sched and avs for pt for Sept °

## 2015-05-07 NOTE — Assessment & Plan Note (Signed)
This is likely related to deconditioning and side effects from treatment. Recent thyroid function test was normal. I encouraged the patient graduated exercise as tolerated.

## 2015-05-07 NOTE — Patient Instructions (Signed)
[facility] Discharge Instructions for Patients Receiving Chemotherapy  Today you received the following chemotherapy agents alimta  To help prevent nausea and vomiting after your treatment, we encourage you to take your nausea medication    If you develop nausea and vomiting that is not controlled by your nausea medication, call the clinic.   BELOW ARE SYMPTOMS THAT SHOULD BE REPORTED IMMEDIATELY:  *FEVER GREATER THAN 100.5 F  *CHILLS WITH OR WITHOUT FEVER  NAUSEA AND VOMITING THAT IS NOT CONTROLLED WITH YOUR NAUSEA MEDICATION  *UNUSUAL SHORTNESS OF BREATH  *UNUSUAL BRUISING OR BLEEDING  TENDERNESS IN MOUTH AND THROAT WITH OR WITHOUT PRESENCE OF ULCERS  *URINARY PROBLEMS  *BOWEL PROBLEMS  UNUSUAL RASH Items with * indicate a potential emergency and should be followed up as soon as possible.  Feel free to call the clinic you have any questions or concerns. The clinic phone number is [phone].  Please show the [place] at check-in to the Emergency Department and triage nurse.

## 2015-05-07 NOTE — Progress Notes (Signed)
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   12/11/2014 - 02/12/2015 Chemotherapy she received 4 cycles of carboplatin and Alimta   02/11/2015 Imaging Repeat MRI and PET CT scan show excellent response to treatment   04/15/2015 Imaging  repeat PET CT scan showed near complete response to treatment.    INTERVAL HISTORY: Please see below for problem oriented charting. She is seen today prior to chemotherapy. Her symptoms are about the same. She have significant fatigue and shortness of breath on minimal exertion. She does not participate in rehabilitation and only mobilize around the home. She does not perform much of activities of daily living. She denies recent cough. Denies recent headache or new weakness.  REVIEW OF SYSTEMS:   Constitutional: Denies fevers, chills or abnormal weight loss Eyes: Denies blurriness of vision Ears, nose, mouth, throat, and face: Denies mucositis or sore throat Cardiovascular: Denies palpitation, chest  discomfort or lower extremity swelling Gastrointestinal:  Denies nausea, heartburn or change in bowel habits Skin: Denies abnormal skin rashes Lymphatics: Denies new lymphadenopathy or easy bruising Neurological:Denies numbness, tingling or new weaknesses Behavioral/Psych: Mood is stable, no new changes  All other systems were reviewed with the patient and are negative.  I have reviewed the past medical history, past surgical history, social history and family history with the patient and they are unchanged from previous note.  ALLERGIES:  has No Known Allergies.  MEDICATIONS:  Current Outpatient Prescriptions  Medication Sig Dispense Refill  . albuterol (PROVENTIL HFA;VENTOLIN HFA) 108 (90 BASE) MCG/ACT inhaler Inhale 2 puffs into the lungs every 4 (four) hours as needed for wheezing or shortness of breath (Inhale 1-2 puffs into lungs as needed for shortness of breath.). 1 Inhaler 0  . aspirin 81 MG tablet Take 81 mg by mouth daily.    . cephALEXin (KEFLEX) 500 MG capsule     . emollient (BIAFINE) cream Apply topically as needed.    . folic acid (FOLVITE) 1 MG tablet     . furosemide (LASIX) 20 MG tablet TAKE 1 TABLET BY MOUTH DAILY 30 tablet 3  . levETIRAcetam (KEPPRA) 500 MG tablet Take 500 mg by mouth once.    Marland Kitchen LORazepam (ATIVAN) 1 MG tablet Take 1 tablet (1 mg total) by mouth every 8 (eight) hours as needed for anxiety (or nausea). 10 tablet 0  . Melatonin 10 MG CAPS Take 10 mg by mouth at bedtime as needed (sleep).     Marland Kitchen olopatadine (PATANOL) 0.1 % ophthalmic solution     . ondansetron (ZOFRAN) 8 MG tablet Take 1 tablet (8 mg  total) by mouth every 8 (eight) hours as needed. 60 tablet 1  . pantoprazole (PROTONIX) 40 MG tablet TAKE 1 TABLET BY MOUTH DAILY AT NOON 30 tablet 6  . PARoxetine (PAXIL) 20 MG tablet Take 20 mg by mouth daily as needed (restlessness).     . simvastatin (ZOCOR) 40 MG tablet Take 40 mg by mouth daily.    Marland Kitchen tobramycin (TOBREX) 0.3 % ophthalmic solution PLACE 2  DROPS IN BOTH EYES EVERY 4 HOURS 5 mL 0  . TRAVEL SICKNESS 25 MG CHEW TAKE 1/2 TABLET BY MOUTH THREE TIMES DAILY AS NEEDED FOR DIZZINESS 15 each 0   No current facility-administered medications for this visit.   Facility-Administered Medications Ordered in Other Visits  Medication Dose Route Frequency Provider Last Rate Last Dose  . sodium chloride 0.9 % injection 10 mL  10 mL Intracatheter PRN Heath Lark, MD   10 mL at 05/07/15 1631    PHYSICAL EXAMINATION: ECOG PERFORMANCE STATUS: 2 - Symptomatic, <50% confined to bed  Filed Vitals:   05/07/15 1402  BP: 134/66  Pulse: 76  Temp: 99.1 F (37.3 C)  Resp: 18   Filed Weights   05/07/15 1402  Weight: 275 lb 4.8 oz (124.875 kg)    GENERAL:alert, no distress and comfortable. She is morbidly obese SKIN: skin color, texture, turgor are normal, no rashes or significant lesions EYES: normal, Conjunctiva are pink and non-injected, sclera clear. Periorbital edema is noted OROPHARYNX:no exudate, no erythema and lips, buccal mucosa, and tongue normal  NECK: supple, thyroid normal size, non-tender, without nodularity LYMPH:  no palpable lymphadenopathy in the cervical, axillary or inguinal LUNGS: scattered wheezes, clear on auscultation with normal breathing effort HEART: regular rate & rhythm and no murmurs with moderate bilateral lower extremity edema ABDOMEN:abdomen soft, non-tender and normal bowel sounds Musculoskeletal:no cyanosis of digits and no clubbing  NEURO: alert & oriented x 3 with fluent speech, no focal motor/sensory deficits  LABORATORY DATA:  I have reviewed the data as listed    Component Value Date/Time   NA 144 05/07/2015 1318   NA 142 12/09/2014 1204   K 3.4* 05/07/2015 1318   K 2.9* 12/09/2014 1204   CL 105 12/09/2014 1204   CO2 27 05/07/2015 1318   CO2 25 12/09/2014 1204   GLUCOSE 85 05/07/2015 1318   GLUCOSE 83 12/09/2014 1204   BUN 13.6 05/07/2015 1318   BUN 17 12/09/2014 1204   CREATININE 0.8  05/07/2015 1318   CREATININE 0.78 12/09/2014 1204   CALCIUM 9.3 05/07/2015 1318   CALCIUM 9.1 12/09/2014 1204   PROT 6.8 05/07/2015 1318   PROT 6.5 12/09/2014 1204   ALBUMIN 3.4* 05/07/2015 1318   ALBUMIN 3.5 12/09/2014 1204   AST 15 05/07/2015 1318   AST 18 12/09/2014 1204   ALT 21 05/07/2015 1318   ALT 28 12/09/2014 1204   ALKPHOS 93 05/07/2015 1318   ALKPHOS 61 12/09/2014 1204   BILITOT 0.34 05/07/2015 1318   BILITOT 0.5 12/09/2014 1204   GFRNONAA 84* 12/09/2014 1204   GFRAA >90 12/09/2014 1204    No results found for: SPEP, UPEP  Lab Results  Component Value Date   WBC 5.5 05/07/2015   NEUTROABS 3.1 05/07/2015   HGB 12.7 05/07/2015   HCT 40.1 05/07/2015   MCV 98.0 05/07/2015   PLT 202 05/07/2015      Chemistry      Component Value Date/Time   NA 144 05/07/2015 1318   NA 142 12/09/2014 1204   K 3.4* 05/07/2015  1318   K 2.9* 12/09/2014 1204   CL 105 12/09/2014 1204   CO2 27 05/07/2015 1318   CO2 25 12/09/2014 1204   BUN 13.6 05/07/2015 1318   BUN 17 12/09/2014 1204   CREATININE 0.8 05/07/2015 1318   CREATININE 0.78 12/09/2014 1204      Component Value Date/Time   CALCIUM 9.3 05/07/2015 1318   CALCIUM 9.1 12/09/2014 1204   ALKPHOS 93 05/07/2015 1318   ALKPHOS 61 12/09/2014 1204   AST 15 05/07/2015 1318   AST 18 12/09/2014 1204   ALT 21 05/07/2015 1318   ALT 28 12/09/2014 1204   BILITOT 0.34 05/07/2015 1318   BILITOT 0.5 12/09/2014 1204      ASSESSMENT & PLAN:  Metastatic adenocarcinoma to brain She complained of profound fatigue which could very well be related to side-effects from recent treatment. She had completed 4 cycles of combination carboplatin and Alimta and was recently switched to Alimta maintenance therapy.[facility], PET CT scan showed near complete response to treatment. We discussed the role of maintenance therapy and she wants to proceed. I will see her back in 3 weeks for further assessment. I suspect a decline in performance  status is due to an element of deconditioning.  Chronic fatigue This is likely related to deconditioning and side effects from treatment. Recent thyroid function test was normal. I encouraged the patient graduated exercise as tolerated.    Periorbital swelling This is likely related to fluid retention from recent corticosteroids treatment. BNP is mildly elevated. I will increase Lasix to 40 mg in the morning and 20 mg in the afternoon.  Hypokalemia Her recent hypokalemia could be due to Lasix. It is mild and she is not symptomatic. I encouraged her to eat potassium rich diet.  COPD (chronic obstructive pulmonary disease) She has significant chronic COPD. She will continue inhalers as needed.   No orders of the defined types were placed in this encounter.   All questions were answered. The patient knows to call the clinic with any problems, questions or concerns. No barriers to learning was detected. I spent 20 minutes counseling the patient face to face. The total time spent in the appointment was 30 minutes and more than 50% was on counseling and review of test results     [facility], [place], MD 05/07/2015 5:52 PM

## 2015-05-07 NOTE — Patient Instructions (Signed)

## 2015-05-07 NOTE — Assessment & Plan Note (Signed)
She has significant chronic COPD. She will continue inhalers as needed.

## 2015-05-07 NOTE — Assessment & Plan Note (Signed)
This is likely related to fluid retention from recent corticosteroids treatment. BNP is mildly elevated. I will increase Lasix to 40 mg in the morning and 20 mg in the afternoon.

## 2015-05-07 NOTE — Progress Notes (Signed)
Pt had appt for labs via PAC. Pt brought in to flush, states she forgot her emla cream and requests labs drawn peripherally today. Helped pt place emla cream on port for tx today and sent back to phlebotomist for lab draw.

## 2015-05-12 ENCOUNTER — Telehealth: Payer: Self-pay | Admitting: *Deleted

## 2015-05-12 ENCOUNTER — Other Ambulatory Visit: Payer: Self-pay | Admitting: Hematology and Oncology

## 2015-05-12 ENCOUNTER — Encounter: Payer: Self-pay | Admitting: *Deleted

## 2015-05-12 DIAGNOSIS — C7931 Secondary malignant neoplasm of brain: Secondary | ICD-10-CM

## 2015-05-12 MED ORDER — PANTOPRAZOLE SODIUM 40 MG PO TBEC: DELAYED_RELEASE_TABLET | ORAL | Status: AC

## 2015-05-12 NOTE — Telephone Encounter (Signed)
Pt called to clarify her medication list.  Spent over 20 minutes on phone w/ pt while she reviewed her medication bottles and we reconciled her med list.  Placed new med list in mail to pt.  Pt stated needs refill on Protonix.  Sent Rx to her pharmacy electronically. Pt also requests Hospital Bed for home.  She is having difficulty getting in and out of her bed with her weakness and decreased mobility.   Order for Hospital Bed to [facility].   S/w Tiffany at [facility] and they will contact pt about bed.

## 2015-05-19 ENCOUNTER — Other Ambulatory Visit: Payer: Self-pay | Admitting: Hematology and Oncology

## 2015-05-21 ENCOUNTER — Telehealth: Payer: Self-pay | Admitting: *Deleted

## 2015-05-21 NOTE — Telephone Encounter (Signed)
PT. IS HAVING SEVERE FATIGUE AND EXTREME WEAKNESS. SHE DOES NOT WANT TO CONTINUE TREATMENT. INSTRUCTED PT. TO KEEP HER 05/28/15 APPOINTMENT WITH [facility]. PT. REQUESTED RETURN CALL TO THIS NUMBER [phone]. IF PT. DOES NOT ANSWER ASK FOR CONNIE RODGERS (PT.'S SISTER). MAY GIVE ALL INFORMATION TO CONNIE.

## 2015-05-21 NOTE — Telephone Encounter (Signed)
I will cancel labs and flush appt but keep her appt to see me. I can always cancel chemo after we have the discussion

## 2015-05-22 NOTE — Telephone Encounter (Signed)
NOTIFIED PT. AND HER SISTER, CONNIE RODGERS, TO KEEP PT.'S APPOINTMENT WITH [facility] ON 05/28/15 AT 2:15PM.

## 2015-05-28 ENCOUNTER — Ambulatory Visit: Payer: Medicare Other

## 2015-05-28 ENCOUNTER — Telehealth: Payer: Self-pay | Admitting: *Deleted

## 2015-05-28 ENCOUNTER — Ambulatory Visit: Payer: Medicare Other | Admitting: Hematology and Oncology

## 2015-05-28 ENCOUNTER — Other Ambulatory Visit: Payer: Medicare Other

## 2015-05-28 NOTE — Telephone Encounter (Signed)
S/w pt and she reports extreme fatigue,  States can't get out of bed and does not want to leave her house for appt today. She wants to r/s to next week.  Discussed w/ pt that we cannot help her much over the phone and I am not sure if she will feel any better next week.  Pt says she wants her sister in law Marlowe Kays to come and talk to Dr. Alvy Bimler today.   Informed her no need for Marlowe Kays to come in we can speak w/ her on the phone.   Discussed [place] with pt and she says she really doesn't know but wants Marlowe Kays to help her make these decisions.  Pt just knows she is too tired to leave her house

## 2015-05-28 NOTE — Telephone Encounter (Signed)
I s/w pt's sister, Marlowe Kays, who states pt has not left her house in almost 3 weeks and is getting weaker and weaker.   Marlowe Kays says pt doesn't want any more chemo at this time and we discussed pt is too probably too weak to get chemo.  She is worried about pt staying home by herself although family is in and out every day pt stays by herself at night.  [facility]ed [facility] could be helpful to pt and family.  Marlowe Kays agrees to [facility] referral.   Informed her we will cancel her appts here for now and to please call us to r/s pt to see Dr. Alvy Bimler anytime pt wants to come into office.  Marlowe Kays verbalized understanding. Referral made to [facility] for [place].  Dr. Alvy Bimler will be the Attending and Hospice to provide Symptom Management orders.

## 2015-05-28 NOTE — Telephone Encounter (Signed)
Called patient after voicemail that she is "too sick to come in and she will have her sister Astrid Divine come instead.  She will bring a health care power of attorney form to have signed today."  Anela says she is weak with no strength.  I can't stand more than five minutes without falling.  I've been weak for weeks and told Dr. Alvy Bimler I'm sick and tired of being sick and tired."  Advised she has to be present for the appointment.  She does have a walker, advised she try this to come in today.  Asked to reschedule because she needs to rest if she has to come in.  Call transferred to collaborative nurse, Cameo RN.

## 2015-06-02 ENCOUNTER — Telehealth: Payer: Self-pay | Admitting: *Deleted

## 2015-06-02 ENCOUNTER — Other Ambulatory Visit: Payer: Self-pay | Admitting: Hematology and Oncology

## 2015-06-02 NOTE — Telephone Encounter (Signed)
Please tell them to fax me orders Consult their medical director for symptom management I refilled meclizine electronically today

## 2015-06-02 NOTE — Telephone Encounter (Signed)
Becky Augusta, RN from Hospice wanted to make Dr Alvy Bimler aware of patient's request. Wants DNR (Hospice MD to sign) and to D/C all meds that are not comfort related.  Needs refill on meclizine 0.5 tid prn dizziness

## 2015-06-11 ENCOUNTER — Telehealth: Payer: Self-pay | Admitting: *Deleted

## 2015-06-11 NOTE — Telephone Encounter (Signed)
VM from Hosp[facility] RN, Becky Augusta, to inform Dr. Alvy Bimler that pt is going to [facility] today for 5 day respite stay.  Pt is getting increasingly weaker and very difficult for family to care for her at home.

## 2015-06-12 ENCOUNTER — Ambulatory Visit: Payer: Medicare Other | Admitting: Cardiology

## 2015-06-23 ENCOUNTER — Telehealth: Payer: Self-pay | Admitting: *Deleted

## 2015-06-23 NOTE — Telephone Encounter (Signed)
Voicemail from [facility] asking "when to bring a cancer policy that needs Dr. Alvy Bimler signature."

## 2015-06-23 NOTE — Telephone Encounter (Signed)
Pt's sister, Marlowe Kays, called to ask if she can drop off a Cancer Policy for Dr. Alvy Bimler to sign.  Instructed Connie to drop it off anytime at front lobby and we will take care of it and call her back when it is completed.  She will try to bring it by tomorrow.

## 2015-06-26 ENCOUNTER — Telehealth: Payer: Self-pay | Admitting: *Deleted

## 2015-06-26 NOTE — Telephone Encounter (Signed)
Informed pt's sister of Physicians Statement on [place] completed and signed.  She request we mail the policy into [place] at address on the form.   I placed the form and the copies of POA Marlowe Kays brought to [place] in the outgoing mail to [place] of Physician Statement form left in file folder out front lobby for [place] to pick up at her convience.

## 2015-07-07 ENCOUNTER — Telehealth: Payer: Self-pay | Admitting: *Deleted

## 2015-07-08 NOTE — Telephone Encounter (Signed)
Pt's sister, Marlowe Kays, called states [employer] asking for more information for pt's [employer].  Informed her we mailed the physicians statement to [facility] and left a copy for her to pick up out front.  Marlowe Kays asks for the copy to be mailed to her instead.   She states pt has never received any bills from the [facility] and has never had to pay any co-pays so there are no bills to send in for the Cancer Policy to cover at this point.  She says she doesn't understand why pt has not received any bills yet?   Angelina Pih the number for [facility] and directed her questions to them.  Placed copy of cancer policy in mail to [place] at pt's home address.

## 2015-07-20 ENCOUNTER — Encounter: Payer: Self-pay | Admitting: Internal Medicine

## 2015-07-20 ENCOUNTER — Non-Acute Institutional Stay (SKILLED_NURSING_FACILITY): Payer: Medicare Other | Admitting: Internal Medicine

## 2015-07-20 DIAGNOSIS — I10 Essential (primary) hypertension: Secondary | ICD-10-CM

## 2015-07-20 DIAGNOSIS — K219 Gastro-esophageal reflux disease without esophagitis: Secondary | ICD-10-CM

## 2015-07-20 DIAGNOSIS — R569 Unspecified convulsions: Secondary | ICD-10-CM | POA: Diagnosis not present

## 2015-07-20 DIAGNOSIS — C7931 Secondary malignant neoplasm of brain: Secondary | ICD-10-CM | POA: Diagnosis not present

## 2015-07-20 DIAGNOSIS — J449 Chronic obstructive pulmonary disease, unspecified: Secondary | ICD-10-CM

## 2015-07-20 DIAGNOSIS — I5032 Chronic diastolic (congestive) heart failure: Secondary | ICD-10-CM

## 2015-07-20 DIAGNOSIS — E785 Hyperlipidemia, unspecified: Secondary | ICD-10-CM | POA: Diagnosis not present

## 2015-07-20 NOTE — Progress Notes (Signed)
MRN: [id number] Name: Caitlin Carter  Sex: female Age: 69 y.o. DOB: October 26, 1945  [facility] #: Karren Burly Facility/Room:126AB Level Of Care: SNF Provider: Inocencio Homes D Emergency Contacts: Extended Emergency Contact Information Primary Emergency Contact: Dicky Doe of [place] Phone: [phone] Relation: Sister Secondary Emergency Contact: Ilean Skill Address: [address]          [place] [address] [place] Phone: [phone] Mobile Phone: [phone] Relation: Brother  Code Status:   Allergies: Review of patient's allergies indicates no known allergies.  Chief Complaint  Patient presents with  . New Admit To SNF    HPI: Patient is 69 y.o. 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 Pt is admitted to SNF for respite. While at SNF pt will be followed for Gerd, tx with protonix, HTN, tx with lasix and lisinopril and seizures tx with keppra.  Past Medical History  Diagnosis Date  . HTN (hypertension)   . Obesity   . HLD (hyperlipidemia)   . Depression   . Brain metastases ([place])   . COPD (chronic obstructive pulmonary disease) ([place])   . Radiation November 13, 2014-December 02, 2014    Whole brain, 35 gray     Past Surgical History  Procedure Laterality Date  . Replacement total knee        Medication List       This list is accurate as of: 07/20/15 11:59 PM.  Always use your most recent med list.               albuterol 108 (90 BASE) MCG/ACT inhaler  Commonly known as:  PROVENTIL HFA;VENTOLIN HFA  Inhale 2 puffs into the lungs every 4 (four) hours as needed for wheezing or shortness of breath (Inhale 1-2 puffs into lungs as needed for shortness of breath.).     amoxicillin 500 MG capsule  Commonly known as:  AMOXIL  Take 500 mg by mouth 2 (two) times daily.     aspirin 81 MG  tablet  Take 81 mg by mouth daily.     cephALEXin 500 MG capsule  Commonly known as:  KEFLEX  Take 500 mg by mouth 3 (three) times daily.     docusate sodium 100 MG capsule  Commonly known as:  COLACE  Take 100 mg by mouth 2 (two) times daily as needed for mild constipation.     folic acid 1 MG tablet  Commonly known as:  FOLVITE  Take 1 mg by mouth daily.     furosemide 20 MG tablet  Commonly known as:  LASIX  Take 20 mg by mouth as directed. Take 2 tabs in am and 1 tab at lunchtime     levETIRAcetam 500 MG tablet  Commonly known as:  KEPPRA  Take 500 mg by mouth 2 (two) times daily.     levETIRAcetam 500 MG tablet  Commonly known as:  KEPPRA  TAKE 1 TABLET BY MOUTH TWICE DAILY     lisinopril-hydrochlorothiazide 20-25 MG tablet  Commonly known as:  PRINZIDE,ZESTORETIC  Take 0.5 tablets by mouth daily.     meclizine 25 MG tablet  Commonly known as:  ANTIVERT  Take 25 mg by mouth 3 (three) times daily as needed for dizziness.     TRAVEL SICKNESS 25 MG Chew  Generic drug:  Meclizine HCl  TAKE 1/2  TABLET BY MOUTH THREE TIMES DAILY AS NEEDED FOR DIZZINESS     Melatonin 10 MG Caps  Take 10 mg by mouth at bedtime as needed (sleep).     olopatadine 0.1 % ophthalmic solution  Commonly known as:  PATANOL  Place 1 drop into both eyes 2 (two) times daily.     ondansetron 8 MG tablet  Commonly known as:  ZOFRAN  Take 1 tablet (8 mg total) by mouth every 8 (eight) hours as needed.     pantoprazole 40 MG tablet  Commonly known as:  PROTONIX  TAKE 1 TABLET BY MOUTH DAILY AT NOON     PARoxetine 20 MG tablet  Commonly known as:  PAXIL  Take 20 mg by mouth daily as needed (restlessness).     prochlorperazine 10 MG tablet  Commonly known as:  COMPAZINE  Take 10 mg by mouth every 6 (six) hours as needed for nausea or vomiting.     simvastatin 40 MG tablet  Commonly known as:  ZOCOR  Take 40 mg by mouth daily.        No orders of the defined types were placed in this  encounter.    Immunization History  Administered Date(s) Administered  . Influenza [place] 07/15/2014    Social History  Substance Use Topics  . Smoking status: Former Smoker -- 1.00 packs/day for 51 years    Types: Cigarettes    Start date: 09/13/1963    Quit date: 10/14/2014  . Smokeless tobacco: Never Used  . Alcohol Use: Not on file    Family history is + breast and lung CA   Review of Systems  DATA OBTAINED: from patient, nurse GENERAL:  no fevers,+ fatigue, some appetite changes SKIN: No itching, rash  EYES: No eye pain, redness, discharge EARS: No earache, tinnitus, change in hearing NOSE: No congestion, drainage or bleeding  MOUTH/THROAT: No mouth or tooth pain, No sore throat RESPIRATORY: No cough, wheezing, some SOB with exertion CARDIAC: No chest pain, palpitations, lower extremity edema  GI: No abdominal pain, No N/V/D or constipation, No heartburn or reflux  GU: No dysuria, frequency or urgency, or incontinence  MUSCULOSKELETAL: No unrelieved bone/joint pain NEUROLOGIC: No headache,occ dizziness, no focal weakness PSYCHIATRIC: No c/o anxiety or sadness   Filed Vitals:   07/20/15 1709  BP: 145/90  Pulse: 99  Temp: 97.3 F (36.3 C)  Resp: 18    SpO2 Readings from Last 1 Encounters:  05/07/15 95%    Physical Exam  GENERAL APPEARANCE: Alert, conversant, obese WF No acute distress.  SKIN: No diaphoresis rash HEAD: Normocephalic, atraumatic  EYES: Conjunctiva/lids clear. Pupils round, reactive. EOMs intact.  EARS: External exam WNL, canals clear. Hearing grossly normal.  NOSE: No deformity or discharge.  MOUTH/THROAT: Lips w/o lesions  RESPIRATORY: Breathing is even, unlabored. Lung sounds are clear   CARDIOVASCULAR: Heart RRR no murmurs, rubs or gallops. No peripheral edema.   GASTROINTESTINAL: Abdomen is soft, non-tender, not distended w/ normal bowel sounds. GENITOURINARY: Bladder non tender, not distended  MUSCULOSKELETAL: No abnormal joints or  musculature NEUROLOGIC:  Cranial nerves 2-12 grossly intact. Moves all extremities  PSYCHIATRIC: Mood and affect appropriate to situation, no behavioral issues  Patient Active Problem List   Diagnosis Date Noted  . Seizures ([place]) 07/23/2015  . GERD (gastroesophageal reflux disease) 07/23/2015  . Periorbital swelling 03/26/2015  . Fall at home 03/05/2015  . Thrombocytopenia due to drugs 02/13/2015  . Non-STEMI (non-ST elevated myocardial infarction) ([place]) 02/10/2015  . Chemotherapy-induced nausea 01/13/2015  .  Chronic fatigue 01/01/2015  . 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 COLD (chronic obstructive lung disease) ([place]) 11/19/2014  . Essential hypertension 11/12/2014  . Dyslipidemia 11/12/2014  . Lymphadenopathy   . Metastatic adenocarcinoma to brain ([place])   . Metastatic cancer to brain ([place])   . Chronic diastolic CHF (congestive heart failure) ([place])   . Obstructive sleep apnea on CPAP   . Abnormal CT scan, chest   . Abnormal CT of brain   . SOB (shortness of breath)   . Left arm weakness   . Mass   . Brain neoplasm ([place])   . Elevated troponin   . Neoplasm   . Tobacco abuse   . Hypokalemia   . Stroke-like symptoms 10/23/2014  . COPD exacerbation ([place]) 10/23/2014  . Vasogenic brain edema ([place]) 10/23/2014  . Obesity 10/23/2014  . Troponin I above reference range 10/23/2014  . OSA on CPAP 10/23/2014  . COPD (chronic obstructive pulmonary disease) ([place]) 10/23/2014    CBC    Component Value Date/Time   WBC 5.5 05/07/2015 1318   WBC 4.8 12/09/2014 1204   RBC 4.09 05/07/2015 1318   RBC 5.06 12/09/2014 1204   HGB 12.7 05/07/2015 1318   HGB 15.1* 12/09/2014 1204   HCT 40.1 05/07/2015 1318   HCT 46.5* 12/09/2014 1204   PLT 202 05/07/2015 1318   PLT 143* 12/09/2014 1204   MCV 98.0 05/07/2015 1318   MCV 91.9 12/09/2014 1204   LYMPHSABS 1.6 05/07/2015 1318   LYMPHSABS 1.2 12/09/2014 1204   MONOABS 0.7 05/07/2015 1318   MONOABS 0.4  12/09/2014 1204   EOSABS 0.1 05/07/2015 1318   EOSABS 0.0 12/09/2014 1204   BASOSABS 0.0 05/07/2015 1318   BASOSABS 0.0 12/09/2014 1204    CMP     Component Value Date/Time   NA 144 05/07/2015 1318   NA 142 12/09/2014 1204   K 3.4* 05/07/2015 1318   K 2.9* 12/09/2014 1204   CL 105 12/09/2014 1204   CO2 27 05/07/2015 1318   CO2 25 12/09/2014 1204   GLUCOSE 85 05/07/2015 1318   GLUCOSE 83 12/09/2014 1204   BUN 13.6 05/07/2015 1318   BUN 17 12/09/2014 1204   CREATININE 0.8 05/07/2015 1318   CREATININE 0.78 12/09/2014 1204   CALCIUM 9.3 05/07/2015 1318   CALCIUM 9.1 12/09/2014 1204   PROT 6.8 05/07/2015 1318   PROT 6.5 12/09/2014 1204   ALBUMIN 3.4* 05/07/2015 1318   ALBUMIN 3.5 12/09/2014 1204   AST 15 05/07/2015 1318   AST 18 12/09/2014 1204   ALT 21 05/07/2015 1318   ALT 28 12/09/2014 1204   ALKPHOS 93 05/07/2015 1318   ALKPHOS 61 12/09/2014 1204   BILITOT 0.34 05/07/2015 1318   BILITOT 0.5 12/09/2014 1204   GFRNONAA 84* 12/09/2014 1204   GFRAA >90 12/09/2014 1204    Lab Results  Component Value Date   HGBA1C 5.6 10/24/2014     Nm Pet Image Restag (ps) Skull Base To Thigh  04/15/2015  CLINICAL DATA:  Subsequent Treatment strategy for metastatic pulmonary adenocarcinoma . EXAM: NUCLEAR MEDICINE PET SKULL BASE TO THIGH TECHNIQUE: 13.9 mCi F-18 FDG was injected intravenously. Full-ring PET imaging was performed from the skull base to thigh after the radiotracer. CT data was obtained and used for attenuation correction and anatomic localization. FASTING BLOOD GLUCOSE:  Value: 107 mg/dl COMPARISON:  02/11/2015 FINDINGS: NECK No hypermetabolic lymph nodes in the neck. Resolution of previous left level to index lymph node. CHEST Resolution  of previous index pre-vascular and sub- carinal lymph nodes. No new hypermetabolic lymph nodes identified. Aortic atherosclerosis identified. Calcifications within the RCA and LAD Coronary artery noted. Index lesion adjacent to the descending  thoracic aorta measures 9 mm and has an SUV max equal to 2.8. Previously this measured the same within SUV max equal to 3.6. No suspicious pulmonary nodules on the CT scan. ABDOMEN/PELVIS No abnormal hypermetabolic activity within the liver, pancreas, adrenal glands, or spleen. No hypermetabolic lymph nodes in the abdomen or pelvis. SKELETON No focal hypermetabolic activity to suggest skeletal metastasis. IMPRESSION: 1. Continued interval response to therapy. 2. Interval resolution of previous index lymph nodes within the neck and chest. 3. Residual nodule within the posterior medial left lower lobe exhibits mild uptake, improved from previous exam. 4. No new or progressive disease identified. 5. Aortic atherosclerosis. Electronically Signed   By: Kerby Moors M.D.   On: 04/15/2015 11:29    Not all labs, radiology exams or other studies done during hospitalization come through on my EPIC note; however they are reviewed by me.    Assessment and Plan  Metastatic adenocarcinoma to brain Primary lung CA , dx 10/2014 metastatic to brain, s/p whole brain radiation and 4 rounds chemo  With maintenace with good response but still getting weaker; SNF -admitted for respite care  COLD (chronic obstructive lung disease) SNF - ill cont prn nebs, no wheezing now  Essential hypertension SNF - pt on lasix and 1/2 tab of lisinopril 20/25; Plan - cont  curent meds  Chronic diastolic CHF (congestive heart failure) Chronic and not an issue at this time; SNF -cnt lasix  Seizures ([facility]) Stable on keppra 500 gmBID; Plan - cont keppra  GERD (gastroesophageal reflux disease) Asymptomatic at this time; Plan - cont protonix 40 mg daily  Dyslipidemia LDL 88 on zocor 40 mg daily; Plan - cont zocor   Time spent 45 min;> 50% of time with patient was spent reviewing records, labs, tests and studies, counseling and developing plan of care  Hennie Duos, MD

## 2015-07-23 ENCOUNTER — Encounter: Payer: Self-pay | Admitting: Internal Medicine

## 2015-07-23 DIAGNOSIS — K219 Gastro-esophageal reflux disease without esophagitis: Secondary | ICD-10-CM | POA: Insufficient documentation

## 2015-07-23 DIAGNOSIS — R569 Unspecified convulsions: Secondary | ICD-10-CM | POA: Insufficient documentation

## 2015-07-23 NOTE — Assessment & Plan Note (Signed)
Stable on keppra 500 gmBID; Plan - cont keppra

## 2015-07-23 NOTE — Assessment & Plan Note (Signed)
Asymptomatic at this time; Plan - cont protonix 40 mg daily

## 2015-07-23 NOTE — Assessment & Plan Note (Signed)
SNF - ill cont prn nebs, no wheezing now

## 2015-07-23 NOTE — Assessment & Plan Note (Signed)
Primary lung CA , dx 10/2014 metastatic to brain, s/p whole brain radiation and 4 rounds chemo  With maintenace with good response but still getting weaker; SNF -admitted for respite care

## 2015-07-23 NOTE — Assessment & Plan Note (Signed)
LDL 88 on zocor 40 mg daily; Plan - cont zocor

## 2015-07-23 NOTE — Assessment & Plan Note (Signed)
SNF - pt on lasix and 1/2 tab of lisinopril 20/25; Plan - cont  curent meds

## 2015-07-23 NOTE — Assessment & Plan Note (Signed)
Chronic and not an issue at this time; SNF -cnt lasix

## 2015-07-24 ENCOUNTER — Telehealth: Payer: Self-pay | Admitting: *Deleted

## 2015-07-24 NOTE — Telephone Encounter (Signed)
Pt's sister, Marlowe Kays, asking about Nursing Home Placement.  She heard if pt admitted to hospital then she can be directly d/c'd to SNF.   Instructed Marlowe Kays to enlist assistance from [facility] for SNF placement.  She says they are already working on it,  But all the Homes are requesting $9,000 to $12,000 up front and they don't have that kind of money.  Apparently you don't have to pay up front if coming from the hospital.  Informed Marlowe Kays if no medical reason for hospitalization,  Dr. Alvy Bimler cannot admit pt to hospital.  Also informed her all hospital admissions need to be approved by hospice unless it is emergency.   Also suggested she take advantage of Respite care as needed.  Marlowe Kays says pt currently in Respite and comes home tomorrow.   Told her it sounds like Hospice is doing everything they can to assist w/ this issue and if there isn't anything more we can add at this time.  Please let us know if anything changes.  She verbalized understanding.

## 2015-07-28 ENCOUNTER — Other Ambulatory Visit: Payer: Self-pay | Admitting: Hematology and Oncology

## 2015-08-13 ENCOUNTER — Non-Acute Institutional Stay (SKILLED_NURSING_FACILITY): Payer: Medicare Other | Admitting: Internal Medicine

## 2015-08-13 DIAGNOSIS — J449 Chronic obstructive pulmonary disease, unspecified: Secondary | ICD-10-CM | POA: Diagnosis not present

## 2015-08-13 DIAGNOSIS — I10 Essential (primary) hypertension: Secondary | ICD-10-CM

## 2015-08-13 DIAGNOSIS — C7931 Secondary malignant neoplasm of brain: Secondary | ICD-10-CM

## 2015-08-13 DIAGNOSIS — K219 Gastro-esophageal reflux disease without esophagitis: Secondary | ICD-10-CM

## 2015-08-13 DIAGNOSIS — E785 Hyperlipidemia, unspecified: Secondary | ICD-10-CM

## 2015-08-13 DIAGNOSIS — R569 Unspecified convulsions: Secondary | ICD-10-CM

## 2015-08-13 DIAGNOSIS — I5032 Chronic diastolic (congestive) heart failure: Secondary | ICD-10-CM | POA: Diagnosis not present

## 2015-08-13 NOTE — Progress Notes (Signed)
MRN: [id number] Name: Caitlin Carter  Sex: female Age: 69 y.o. DOB: 10-12-1945  [place] #: Karren Burly Facility/Room:208 Level Of Care: SNF Provider: Inocencio Homes D Emergency Contacts: Extended Emergency Contact Information Primary Emergency Contact: Dicky Doe of [place] Phone: [phone] Relation: Sister Secondary Emergency Contact: Ilean Skill Address: [address]          [place] [address] [place] Phone: [phone] Mobile Phone: [phone] Relation: Brother  Code Status:   Allergies: Review of patient's allergies indicates no known allergies.  Chief Complaint  Patient presents with  . New Admit To SNF    HPI: Patient is 69 y.o. 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 Pt has decided to stop all treatments.Pt is admitted to SNF because she can no longer be cared for at home. While at SNF pt will be followed for Gerd, tx with protonix, HTN, tx with lasix and lisinopril and seizures tx with keppra.  Past Medical History  Diagnosis Date  . HTN (hypertension)   . Obesity   . HLD (hyperlipidemia)   . Depression   . Brain metastases ([place])   . COPD (chronic obstructive pulmonary disease) ([place])   . Radiation November 13, 2014-December 02, 2014    Whole brain, 35 gray     Past Surgical History  Procedure Laterality Date  . Replacement total knee        Medication List       This list is accurate as of: 08/13/15 11:59 PM.  Always use your most recent med list.               albuterol 108 (90 BASE) MCG/ACT inhaler  Commonly known as:  PROVENTIL HFA;VENTOLIN HFA  Inhale 2 puffs into the lungs every 4 (four) hours as needed for wheezing or shortness of breath (Inhale 1-2 puffs into lungs as needed for shortness of breath.).     amoxicillin 500 MG capsule  Commonly known as:   AMOXIL  Take 500 mg by mouth 2 (two) times daily.     aspirin 81 MG tablet  Take 81 mg by mouth daily.     docusate sodium 100 MG capsule  Commonly known as:  COLACE  Take 100 mg by mouth 2 (two) times daily as needed for mild constipation.     folic acid 1 MG tablet  Commonly known as:  FOLVITE  Take 1 mg by mouth daily.     furosemide 20 MG tablet  Commonly known as:  LASIX  Take 20 mg by mouth as directed. Take 2 tabs in am and 1 tab at lunchtime     levETIRAcetam 500 MG tablet  Commonly known as:  KEPPRA  Take 500 mg by mouth 2 (two) times daily.     levETIRAcetam 500 MG tablet  Commonly known as:  KEPPRA  TAKE 1 TABLET BY MOUTH TWICE DAILY     lisinopril-hydrochlorothiazide 20-25 MG tablet  Commonly known as:  PRINZIDE,ZESTORETIC  Take 0.5 tablets by mouth daily.     meclizine 25 MG tablet  Commonly known as:  ANTIVERT  Take 25 mg by mouth 3 (three) times daily as needed for dizziness.     TRAVEL SICKNESS 25 MG Chew  Generic drug:  Meclizine HCl  TAKE 1/2 TABLET BY MOUTH THREE TIMES DAILY AS NEEDED FOR DIZZINESS  Melatonin 10 MG Caps  Take 10 mg by mouth at bedtime as needed (sleep).     ondansetron 8 MG tablet  Commonly known as:  ZOFRAN  Take 1 tablet (8 mg total) by mouth every 8 (eight) hours as needed.     pantoprazole 40 MG tablet  Commonly known as:  PROTONIX  TAKE 1 TABLET BY MOUTH DAILY AT NOON     PARoxetine 20 MG tablet  Commonly known as:  PAXIL  Take 20 mg by mouth daily as needed (restlessness).     prochlorperazine 10 MG tablet  Commonly known as:  COMPAZINE  Take 10 mg by mouth every 6 (six) hours as needed for nausea or vomiting.        No orders of the defined types were placed in this encounter.    Immunization History  Administered Date(s) Administered  . Influenza [place] 07/15/2014    Social History  Substance Use Topics  . Smoking status: Former Smoker -- 1.00 packs/day for 51 years    Types: Cigarettes    Start  date: 09/13/1963    Quit date: 10/14/2014  . Smokeless tobacco: Never Used  . Alcohol Use: Not on file    Family history is + breast and lungCA  Review of Systems  DATA OBTAINED: from patient, nurse, medical record GENERAL:  no fevers, fatigue, appetite changes SKIN: No itching, rash or wounds EYES: No eye pain, redness, discharge EARS: No earache, tinnitus, change in hearing NOSE: No congestion, drainage or bleeding  MOUTH/THROAT: No mouth or tooth pain, No sore throat RESPIRATORY: No cough, wheezing, SOB CARDIAC: No chest pain, palpitations, lower extremity edema  GI: No abdominal pain, No N/V/D or constipation, No heartburn or reflux  GU: No dysuria, frequency or urgency, or incontinence  MUSCULOSKELETAL: No unrelieved bone/joint pain NEUROLOGIC: No headache, dizziness or focal weakness PSYCHIATRIC: No c/o anxiety or sadness   Filed Vitals:   08/13/15 1532  BP: 145/90  Pulse: 99  Temp: 97.3 F (36.3 C)  Resp: 18    SpO2 Readings from Last 1 Encounters:  08/13/15 98%        Physical Exam  GENERAL APPEARANCE: Alert, conversant,  No acute distress.  SKIN: No diaphoresis rash HEAD: Normocephalic, atraumatic  EYES: Conjunctiva/lids clear. Pupils round, reactive. EOMs intact.  EARS: External exam WNL, canals clear. Hearing grossly normal.  NOSE: No deformity or discharge.  MOUTH/THROAT: Lips w/o lesions  RESPIRATORY: Breathing is even, unlabored. Lung sounds are clear   CARDIOVASCULAR: Heart RRR no murmurs, rubs or gallops. Trace peripheral edema.   GASTROINTESTINAL: Abdomen is soft, non-tender, not distended w/ normal bowel sounds. GENITOURINARY: Bladder non tender, not distended  MUSCULOSKELETAL: No abnormal joints or musculature NEUROLOGIC:  Cranial nerves 2-12 grossly intact. Moves all extremities  PSYCHIATRIC: Mood and affect appropriate to situation, no behavioral issues  Patient Active Problem List   Diagnosis Date Noted  . Seizures ([place]) 07/23/2015  .  GERD (gastroesophageal reflux disease) 07/23/2015  . Periorbital swelling 03/26/2015  . Fall at home 03/05/2015  . Thrombocytopenia due to drugs 02/13/2015  . Non-STEMI (non-ST elevated myocardial infarction) ([place]) 02/10/2015  . Chemotherapy-induced nausea 01/13/2015  . Chronic fatigue 01/01/2015  . Metastatic adenocarcinoma to brain with unknown primary site [facility])   . COLD (chronic obstructive lung disease) ([place]) 11/19/2014  . Essential hypertension 11/12/2014  . Dyslipidemia 11/12/2014  . Lymphadenopathy   . Metastatic adenocarcinoma to brain ([place])   . Metastatic cancer to brain ([place])   . Chronic diastolic CHF (congestive  heart failure) ([place])   . Obstructive sleep apnea on CPAP   . Abnormal CT scan, chest   . Abnormal CT of brain   . SOB (shortness of breath)   . Left arm weakness   . Mass   . Brain neoplasm ([place])   . Elevated troponin   . Neoplasm   . Tobacco abuse   . Hypokalemia   . Stroke-like symptoms 10/23/2014  . COPD exacerbation ([place]) 10/23/2014  . Vasogenic brain edema ([place]) 10/23/2014  . Obesity 10/23/2014  . Troponin I above reference range 10/23/2014  . OSA on CPAP 10/23/2014  . COPD (chronic obstructive pulmonary disease) ([place]) 10/23/2014    CBC    Component Value Date/Time   WBC 5.5 05/07/2015 1318   WBC 4.8 12/09/2014 1204   RBC 4.09 05/07/2015 1318   RBC 5.06 12/09/2014 1204   HGB 12.7 05/07/2015 1318   HGB 15.1* 12/09/2014 1204   HCT 40.1 05/07/2015 1318   HCT 46.5* 12/09/2014 1204   PLT 202 05/07/2015 1318   PLT 143* 12/09/2014 1204   MCV 98.0 05/07/2015 1318   MCV 91.9 12/09/2014 1204   LYMPHSABS 1.6 05/07/2015 1318   LYMPHSABS 1.2 12/09/2014 1204   MONOABS 0.7 05/07/2015 1318   MONOABS 0.4 12/09/2014 1204   EOSABS 0.1 05/07/2015 1318   EOSABS 0.0 12/09/2014 1204   BASOSABS 0.0 05/07/2015 1318   BASOSABS 0.0 12/09/2014 1204    CMP     Component Value Date/Time   NA 144 05/07/2015 1318   NA 142 12/09/2014 1204   K 3.4* 05/07/2015  1318   K 2.9* 12/09/2014 1204   CL 105 12/09/2014 1204   CO2 27 05/07/2015 1318   CO2 25 12/09/2014 1204   GLUCOSE 85 05/07/2015 1318   GLUCOSE 83 12/09/2014 1204   BUN 13.6 05/07/2015 1318   BUN 17 12/09/2014 1204   CREATININE 0.8 05/07/2015 1318   CREATININE 0.78 12/09/2014 1204   CALCIUM 9.3 05/07/2015 1318   CALCIUM 9.1 12/09/2014 1204   PROT 6.8 05/07/2015 1318   PROT 6.5 12/09/2014 1204   ALBUMIN 3.4* 05/07/2015 1318   ALBUMIN 3.5 12/09/2014 1204   AST 15 05/07/2015 1318   AST 18 12/09/2014 1204   ALT 21 05/07/2015 1318   ALT 28 12/09/2014 1204   ALKPHOS 93 05/07/2015 1318   ALKPHOS 61 12/09/2014 1204   BILITOT 0.34 05/07/2015 1318   BILITOT 0.5 12/09/2014 1204   GFRNONAA 84* 12/09/2014 1204   GFRAA >90 12/09/2014 1204    Lab Results  Component Value Date   HGBA1C 5.6 10/24/2014     Nm Pet Image Restag (ps) Skull Base To Thigh  04/15/2015  CLINICAL DATA:  Subsequent Treatment strategy for metastatic pulmonary adenocarcinoma . EXAM: NUCLEAR MEDICINE PET SKULL BASE TO THIGH TECHNIQUE: 13.9 mCi F-18 FDG was injected intravenously. Full-ring PET imaging was performed from the skull base to thigh after the radiotracer. CT data was obtained and used for attenuation correction and anatomic localization. FASTING BLOOD GLUCOSE:  Value: 107 mg/dl COMPARISON:  02/11/2015 FINDINGS: NECK No hypermetabolic lymph nodes in the neck. Resolution of previous left level to index lymph node. CHEST Resolution of previous index pre-vascular and sub- carinal lymph nodes. No new hypermetabolic lymph nodes identified. Aortic atherosclerosis identified. Calcifications within the RCA and LAD Coronary artery noted. Index lesion adjacent to the descending thoracic aorta measures 9 mm and has an SUV max equal to 2.8. Previously this measured the same within SUV max equal to 3.6. No suspicious  pulmonary nodules on the CT scan. ABDOMEN/PELVIS No abnormal hypermetabolic activity within the liver, pancreas,  adrenal glands, or spleen. No hypermetabolic lymph nodes in the abdomen or pelvis. SKELETON No focal hypermetabolic activity to suggest skeletal metastasis. IMPRESSION: 1. Continued interval response to therapy. 2. Interval resolution of previous index lymph nodes within the neck and chest. 3. Residual nodule within the posterior medial left lower lobe exhibits mild uptake, improved from previous exam. 4. No new or progressive disease identified. 5. Aortic atherosclerosis. Electronically Signed   By: Kerby Moors M.D.   On: 04/15/2015 11:29    Not all labs, radiology exams or other studies done during hospitalization come through on my EPIC note; however they are reviewed by me.    Assessment and Plan  Metastatic adenocarcinoma to brain SNF - Primarly lung with brain mets dx 10/2014, s/p whole brain rads and 4 rounds chemo. Pt has decided to stop all tx; she is admitted to SNF for residential and Hospice care.  Essential hypertension SNF - pt on 1/2 tab vasoretic 20/25 and lasix , controlled on current medsplan - cont surrent meds  Chronic diastolic CHF (congestive heart failure) SNF - chronic and stable; cont lasix  COLD (chronic obstructive lung disease) SNF - chronic and stable with just prn nebs  GERD (gastroesophageal reflux disease) Chronic and stable;plan cont protonix 40 mg daily  Seizures (HCC) SNF - no recent seizures;plan - cont keppra 500 mg BID  Dyslipidemia SNF - pt no longer on a statin   Time spent > 45 min;> 50% of time with patient was spent reviewing records, labs, tests and studies, counseling and developing plan of care  Hennie Duos, MD

## 2015-08-15 ENCOUNTER — Encounter: Payer: Self-pay | Admitting: Internal Medicine

## 2015-08-15 NOTE — Assessment & Plan Note (Signed)
SNF - pt no longer on a statin

## 2015-08-15 NOTE — Assessment & Plan Note (Signed)
SNF - no recent seizures;plan - cont keppra 500 mg BID

## 2015-08-15 NOTE — Assessment & Plan Note (Signed)
Chronic and stable;plan cont protonix 40 mg daily

## 2015-08-15 NOTE — Assessment & Plan Note (Signed)
SNF - chronic and stable; cont lasix

## 2015-08-15 NOTE — Assessment & Plan Note (Signed)
SNF - Primarly lung with brain mets dx 10/2014, s/p whole brain rads and 4 rounds chemo. Pt has decided to stop all tx; she is admitted to SNF for residential and Hospice care.

## 2015-08-15 NOTE — Assessment & Plan Note (Signed)
SNF - chronic and stable with just prn nebs

## 2015-08-15 NOTE — Assessment & Plan Note (Signed)
SNF - pt on 1/2 tab vasoretic 20/25 and lasix , controlled on current medsplan - cont surrent meds

## 2015-08-21 LAB — BASIC METABOLIC PANEL
BUN: 18 mg/dL (ref 4–21)
CREATININE: 0.8 mg/dL (ref 0.5–1.1)
Glucose: 97 mg/dL
Potassium: 3.2 mmol/L — AB (ref 3.4–5.3)
SODIUM: 143 mmol/L (ref 137–147)

## 2015-08-21 LAB — CBC AND DIFFERENTIAL
HEMATOCRIT: 46 % (ref 36–46)
Hemoglobin: 13.6 g/dL (ref 12.0–16.0)
Platelets: 174 10*3/uL (ref 150–399)
WBC: 4.9 10^3/mL

## 2015-08-21 LAB — HEPATIC FUNCTION PANEL
ALK PHOS: 76 U/L (ref 25–125)
ALT: 13 U/L (ref 7–35)
AST: 10 U/L — AB (ref 13–35)

## 2015-09-15 ENCOUNTER — Non-Acute Institutional Stay (SKILLED_NURSING_FACILITY): Payer: Medicare Other | Admitting: Adult Health

## 2015-09-15 ENCOUNTER — Encounter: Payer: Self-pay | Admitting: Adult Health

## 2015-09-15 DIAGNOSIS — I5032 Chronic diastolic (congestive) heart failure: Secondary | ICD-10-CM

## 2015-09-15 DIAGNOSIS — I214 Non-ST elevation (NSTEMI) myocardial infarction: Secondary | ICD-10-CM

## 2015-09-15 DIAGNOSIS — I10 Essential (primary) hypertension: Secondary | ICD-10-CM

## 2015-09-15 DIAGNOSIS — I25119 Atherosclerotic heart disease of native coronary artery with unspecified angina pectoris: Secondary | ICD-10-CM | POA: Diagnosis not present

## 2015-09-15 DIAGNOSIS — R569 Unspecified convulsions: Secondary | ICD-10-CM

## 2015-09-15 DIAGNOSIS — F32A Depression, unspecified: Secondary | ICD-10-CM

## 2015-09-15 DIAGNOSIS — F329 Major depressive disorder, single episode, unspecified: Secondary | ICD-10-CM

## 2015-09-15 DIAGNOSIS — I2510 Atherosclerotic heart disease of native coronary artery without angina pectoris: Secondary | ICD-10-CM | POA: Insufficient documentation

## 2015-09-15 DIAGNOSIS — C7931 Secondary malignant neoplasm of brain: Secondary | ICD-10-CM | POA: Diagnosis not present

## 2015-09-15 DIAGNOSIS — K219 Gastro-esophageal reflux disease without esophagitis: Secondary | ICD-10-CM

## 2015-09-15 DIAGNOSIS — J438 Other emphysema: Secondary | ICD-10-CM

## 2015-09-15 NOTE — Progress Notes (Signed)
Patient ID: Caitlin Carter, female   DOB: 01/31/1946, 70 y.o.   MRN: [id number]    Facility:  [facility]      No Known Allergies  Chief Complaint  Patient presents with  . Medical Management of Chronic Issues    HPI:  She is a long term resident of this facility being seen for the management of her chronic illnesses. She is followed by hospice care.  She told me today that she is sleepy but feels good. There are no nursing concerns at this time.    Past Medical History  Diagnosis Date  . HTN (hypertension)   . Obesity   . HLD (hyperlipidemia)   . Depression   . Brain metastases ([place])   . COPD (chronic obstructive pulmonary disease) ([place])   . Radiation November 13, 2014-December 02, 2014    Whole brain, 35 gray     Past Surgical History  Procedure Laterality Date  . Replacement total knee      VITAL SIGNS BP 120/72 mmHg  Pulse 86  Ht '5\' 7"'$  (1.702 m)  Wt 268 lb (121.564 kg)  BMI 41.96 kg/m2  SpO2 97%  Patient's Medications  New Prescriptions   No medications on file  Previous Medications   Duoneb Every 6 hours as needed    ASPIRIN 81 MG TABLET    Take 81 mg by mouth daily.   FUROSEMIDE (LASIX) 20 MG TABLET    Take 20 mg by mouth daily.    IBUPROFEN (ADVIL,MOTRIN) 800 MG TABLET    Take 800 mg by mouth every 8 (eight) hours as needed.   LEVETIRACETAM (KEPPRA) 500 MG TABLET    Take 500 mg by mouth 2 (two) times daily.    LISINOPRIL-HYDROCHLOROTHIAZIDE (PRINZIDE,ZESTORETIC) 20-25 MG PER TABLET    Take 0.5 tablets by mouth 2 (two) times daily.    MECLIZINE (ANTIVERT) 25 MG TABLET    Take 12.5 mg by mouth 3 (three) times daily as needed for dizziness.    MELATONIN 10 MG CAPS    Take 9 mg by mouth at bedtime as needed (sleep).    MORPHINE (ROXANOL) 20 MG/ML CONCENTRATED SOLUTION    Take 5 mg by mouth every 4 (four) hours as needed for severe pain.   ONDANSETRON (ZOFRAN) 8 MG TABLET    Take 1 tablet (8 mg total) by mouth every 8 (eight) hours as needed.   PANTOPRAZOLE  (PROTONIX) 40 MG TABLET    TAKE 1 TABLET BY MOUTH DAILY AT NOON   PAROXETINE (PAXIL) 20 MG TABLET    Take 20 mg by mouth daily as needed (restlessness).    PROCHLORPERAZINE (COMPAZINE) 10 MG TABLET    Take 10 mg by mouth every 6 (six) hours as needed for nausea or vomiting.   SENNOSIDES-DOCUSATE SODIUM (SENOKOT-S) 8.6-50 MG TABLET    Take 1 tablet by mouth 2 (two) times daily.  Modified Medications   No medications on file  Discontinued Medications     SIGNIFICANT DIAGNOSTIC EXAMS   LABS REVIEWED:   04-15-15:tsh 0.939 05-07-15: wbc 5.5; hgb 12.7; hct 40.1; mcv 98.0; plt 202; glucose 85; bun 13.6; creat 0.8; k+ 3.4; na++144; liver normal albumin 3.4 HIV: nr; Hep B: nr; HCV Ab; <0.1   Review of Systems  Constitutional: Negative for malaise/fatigue.  Respiratory: Negative for cough and shortness of breath.   Cardiovascular: Negative for chest pain, palpitations and leg swelling.  Gastrointestinal: Negative for heartburn, abdominal pain and constipation.  Musculoskeletal: Negative for myalgias and joint pain.  Skin: Negative.   Psychiatric/Behavioral: The patient is not nervous/anxious.     Physical Exam  Constitutional: No distress.  Obese   Eyes: Conjunctivae are normal.  Neck: Neck supple. No JVD present. No thyromegaly present.  Cardiovascular: Normal rate, regular rhythm and intact distal pulses.   Respiratory: Effort normal and breath sounds normal. No respiratory distress. She has no wheezes.  GI: Soft. Bowel sounds are normal. She exhibits no distension. There is no tenderness.  Musculoskeletal: She exhibits no edema.  Able to move all extremities   Lymphadenopathy:    She has no cervical adenopathy.  Neurological: She is alert.  Skin: Skin is warm and dry. She is not diaphoretic.  Psychiatric: She has a normal mood and affect.       ASSESSMENT/ PLAN:  1. Seizures: no reports of seizure activity present; will continue keppra 500 mg twice daily   2. COPD: is  presently stable will continue duoneb every 6 hours as needed  3. Depression: will continue paxil 20 mg daily and takes melatonin 9 mg nightly for sleep   4. GERD; will continue protonix 40 mg daily   5. diastolic heart failure: will continue lasix 20 mg daily   6. Hypertension: will continue lisinopril/hct 20/25 mg 1/2 tab twice daily   7. Constipation: will continue senna s twice daily   8.  CAD status post MI no complaints of chest pain; will continue asa 81 mg daily   9. Metastatic adenocarcinoma to brain: has completed 4 rounds of chemo; has decided upon no further treatment. Has roxanol 5 mg every 4 hours as needed;  Has compazine 10 mg every 6 hours as needed for N/V and has zofran 8 mg every 8 hours as needed for N/V. Is being followed by hospice care.      Ok Edwards NP [facility]  Contact [phone] Monday through Friday 8am- 5pm  After hours call [phone]

## 2015-10-14 ENCOUNTER — Non-Acute Institutional Stay (SKILLED_NURSING_FACILITY): Payer: Medicare Other | Admitting: Adult Health

## 2015-10-14 DIAGNOSIS — R569 Unspecified convulsions: Secondary | ICD-10-CM

## 2015-10-14 DIAGNOSIS — I5032 Chronic diastolic (congestive) heart failure: Secondary | ICD-10-CM

## 2015-10-14 DIAGNOSIS — C7931 Secondary malignant neoplasm of brain: Secondary | ICD-10-CM | POA: Diagnosis not present

## 2015-10-14 DIAGNOSIS — C801 Malignant (primary) neoplasm, unspecified: Secondary | ICD-10-CM | POA: Diagnosis not present

## 2015-10-14 DIAGNOSIS — J438 Other emphysema: Secondary | ICD-10-CM

## 2015-10-27 ENCOUNTER — Non-Acute Institutional Stay (SKILLED_NURSING_FACILITY): Payer: Medicare Other | Admitting: Adult Health

## 2015-10-27 ENCOUNTER — Encounter: Payer: Self-pay | Admitting: Adult Health

## 2015-10-27 DIAGNOSIS — C801 Malignant (primary) neoplasm, unspecified: Secondary | ICD-10-CM | POA: Diagnosis not present

## 2015-10-27 DIAGNOSIS — C7931 Secondary malignant neoplasm of brain: Secondary | ICD-10-CM | POA: Diagnosis not present

## 2015-10-27 DIAGNOSIS — J438 Other emphysema: Secondary | ICD-10-CM | POA: Diagnosis not present

## 2015-10-27 NOTE — Progress Notes (Signed)
Patient ID: Caitlin Carter, female   DOB: 06-05-1946, 70 y.o.   MRN: [id number]    Facility:  [facility]       No Known Allergies  Chief Complaint  Patient presents with  . Acute Visit    Change in status    HPI:  She is followed by hospice care. She has had a change of her condition and her status is terminal. She is not eating or drinking and is not responsive. The goal of her care is for comfort only. She is not displaying any signs of distress present. The hospice nurse has spoken with and feels as I do that it is time to stop her medications.    Past Medical History  Diagnosis Date  . HTN (hypertension)   . Obesity   . HLD (hyperlipidemia)   . Depression   . Brain metastases ([place])   . COPD (chronic obstructive pulmonary disease) ([place])   . Radiation November 13, 2014-December 02, 2014    Whole brain, 35 gray     Past Surgical History  Procedure Laterality Date  . Replacement total knee      VITAL SIGNS BP 104/56 mmHg  Pulse 84  Temp(Src) 98.7 F (37.1 C) (Oral)  Resp 16  Ht '5\' 7"'$  (1.702 m)  Wt 265 lb (120.203 kg)  BMI 41.50 kg/m2  SpO2 95%  Patient's Medications  New Prescriptions   No medications on file  Previous Medications   ACETAMINOPHEN (TYLENOL) 325 MG TABLET    Take 650 mg by mouth every 4 (four) hours as needed.   ASPIRIN 81 MG TABLET    Take 81 mg by mouth daily. Reported on 10/27/2015   FUROSEMIDE (LASIX) 20 MG TABLET    Take 20 mg by mouth daily.    IBUPROFEN (ADVIL,MOTRIN) 800 MG TABLET    Take 800 mg by mouth every 8 (eight) hours as needed.   IPRATROPIUM-ALBUTEROL (DUONEB) 0.5-2.5 (3) MG/3ML SOLN    Take 3 mLs by nebulization every 6 (six) hours as needed.   LEVETIRACETAM (KEPPRA) 500 MG TABLET    Take 500 mg by mouth 2 (two) times daily.    LISINOPRIL-HYDROCHLOROTHIAZIDE (PRINZIDE,ZESTORETIC) 20-25 MG PER TABLET    Take 0.5 tablets by mouth 2 (two) times daily.    MECLIZINE (ANTIVERT) 25 MG TABLET    Take 25 mg by mouth. Take 0.5 tablet by mouth  every 8 hours prn dizziness   MELATONIN 10 MG CAPS    Take 9 mg by mouth at bedtime as needed (sleep).    MORPHINE (ROXANOL) 20 MG/ML CONCENTRATED SOLUTION    Take by mouth. Give 0.25 ml by mouth every 2 hours prn for pain. Give 5 mg by ,Katherina Mires every 4 hours for pain or dyspnea.   ONDANSETRON (ZOFRAN) 8 MG TABLET    Take 1 tablet (8 mg total) by mouth every 8 (eight) hours as needed.   PANTOPRAZOLE (PROTONIX) 40 MG TABLET    TAKE 1 TABLET BY MOUTH DAILY AT NOON   PAROXETINE (PAXIL) 20 MG TABLET    Take 20 mg by mouth daily as needed (restlessness).    PROCHLORPERAZINE (COMPAZINE) 10 MG TABLET    Take 10 mg by mouth every 6 (six) hours as needed for nausea or vomiting.   PROMETHAZINE (PHENERGAN) 25 MG/ML INJECTION    Inject 25 mg into the vein every 6 (six) hours as needed for nausea or vomiting.   SENNOSIDES-DOCUSATE SODIUM (SENOKOT-S) 8.6-50 MG TABLET    Take 1 tablet by  mouth 2 (two) times daily.  Modified Medications   No medications on file  Discontinued Medications   MECLIZINE (ANTIVERT) 25 MG TABLET    Take 12.5 mg by mouth 3 (three) times daily as needed for dizziness. Reported on 10/27/2015   MORPHINE (ROXANOL) 20 MG/ML CONCENTRATED SOLUTION    Take 5 mg by mouth every 4 (four) hours as needed for severe pain. Reported on 10/27/2015     SIGNIFICANT DIAGNOSTIC EXAMS  LABS REVIEWED:   04-15-15:tsh 0.939 05-07-15: wbc 5.5; hgb 12.7; hct 40.1; mcv 98.0; plt 202; glucose 85; bun 13.6; creat 0.8; k+ 3.4; na++144; liver normal albumin 3.4 HIV: nr; Hep B: nr; HCV Ab; <0.1  Review of Systems  Unable to perform ROS: medical condition      Physical Exam  Constitutional: No distress.  Obese   Eyes: Conjunctivae are normal.  Neck: Neck supple. No JVD present. No thyromegaly present.  Cardiovascular: Normal rate, regular rhythm and intact distal pulses.   Respiratory: Effort normal and breath sounds normal. No respiratory distress. She has no wheezes.  GI: Soft. Bowel sounds are normal. She  exhibits no distension. There is no tenderness.  Musculoskeletal: She exhibits no edema.  Able to move all extremities   Lymphadenopathy:    She has no cervical adenopathy.  Neurological: not responsive   Skin: Skin is warm and dry. She is not diaphoretic.         ASSESSMENT/ PLAN:  1. COPD: is presently stable will continue duoneb every 6 hours as needed  2. Metastatic adenocarcinoma to brain: has completed 4 rounds of chemo; has decided upon no further treatment.     The goal of her care is comfort only. Will stop: asa; motrin; lasix; lisinopril; melatonin; zoloft; phenergan; and senna  Will continue her comfort medications Will monitor her status.     Time spent with patient 40   minutes >50% time spent counseling; reviewing medical record; tests; labs; and developing future plan of care       Ok Edwards NP [facility]  Contact [phone] Monday through Friday 8am- 5pm  After hours call [phone]

## 2015-11-11 DEATH — deceased

## 2015-12-01 NOTE — Progress Notes (Signed)
Patient ID: Caitlin Carter, female   DOB: 12-Oct-1945, 70 y.o.   MRN: [id number]    Facility:  [facility]       No Known Allergies  Chief Complaint  Patient presents with  . Medical Management of Chronic Issues     HPI:  She is a long term resident of this facility being seen for the management of her chronic illnesses. Overall there is little change in her status. She tells me that she is doing "ok". There are no nursing concerns at this time. She continues to be followed by hospice care.   Past Medical History  Diagnosis Date  . HTN (hypertension)   . Obesity   . HLD (hyperlipidemia)   . Depression   . Brain metastases ([place])   . COPD (chronic obstructive pulmonary disease) ([place])   . Radiation November 13, 2014-December 02, 2014    Whole brain, 35 gray     Past Surgical History  Procedure Laterality Date  . Replacement total knee      VITAL SIGNS BP 121/64 mmHg  Pulse 68  Ht '5\' 7"'$  (1.702 m)  Wt 265 lb (120.203 kg)  BMI 41.50 kg/m2  SpO2 97%  Patient's Medications  New Prescriptions   No medications on file  Previous Medications   ACETAMINOPHEN (TYLENOL) 325 MG TABLET    Take 650 mg by mouth every 4 (four) hours as needed.   ASPIRIN 81 MG TABLET    Take 81 mg by mouth daily. Reported on 10/27/2015   FUROSEMIDE (LASIX) 20 MG TABLET    Take 20 mg by mouth daily.    IBUPROFEN (ADVIL,MOTRIN) 800 MG TABLET    Take 800 mg by mouth every 8 (eight) hours as needed.   IPRATROPIUM-ALBUTEROL (DUONEB) 0.5-2.5 (3) MG/3ML SOLN    Take 3 mLs by nebulization every 6 (six) hours as needed.   LEVETIRACETAM (KEPPRA) 500 MG TABLET    Take 500 mg by mouth 2 (two) times daily.    LISINOPRIL-HYDROCHLOROTHIAZIDE (PRINZIDE,ZESTORETIC) 20-25 MG PER TABLET    Take 0.5 tablets by mouth 2 (two) times daily.    MECLIZINE (ANTIVERT) 25 MG TABLET    Take 25 mg by mouth. Take 0.5 tablet by mouth every 8 hours prn dizziness   MELATONIN 10 MG CAPS    Take 9 mg by mouth at bedtime as needed (sleep).    MORPHINE (ROXANOL) 20 MG/ML CONCENTRATED SOLUTION    Take by mouth. Give 0.25 ml by mouth every 2 hours prn for pain. Give 5 mg by ,Katherina Mires every 4 hours for pain or dyspnea.   ONDANSETRON (ZOFRAN) 8 MG TABLET    Take 1 tablet (8 mg total) by mouth every 8 (eight) hours as needed.   PANTOPRAZOLE (PROTONIX) 40 MG TABLET    TAKE 1 TABLET BY MOUTH DAILY AT NOON   PAROXETINE (PAXIL) 20 MG TABLET    Take 20 mg by mouth daily as needed (restlessness).    PROCHLORPERAZINE (COMPAZINE) 10 MG TABLET    Take 10 mg by mouth every 6 (six) hours as needed for nausea or vomiting.   PROMETHAZINE (PHENERGAN) 25 MG/ML INJECTION    Inject 25 mg into the vein every 6 (six) hours as needed for nausea or vomiting.   SENNOSIDES-DOCUSATE SODIUM (SENOKOT-S) 8.6-50 MG TABLET    Take 1 tablet by mouth 2 (two) times daily.  Modified Medications   No medications on file  Discontinued Medications   No medications on file     SIGNIFICANT DIAGNOSTIC EXAMS  LABS REVIEWED:   04-15-15:tsh 0.939 05-07-15: wbc 5.5; hgb 12.7; hct 40.1; mcv 98.0; plt 202; glucose 85; bun 13.6; creat 0.8; k+ 3.4; na++144; liver normal albumin 3.4 HIV: nr; Hep B: nr; HCV Ab; <0.1   Review of Systems  Constitutional: Negative for malaise/fatigue.  Respiratory: Negative for cough and shortness of breath.   Cardiovascular: Negative for chest pain, palpitations and leg swelling.  Gastrointestinal: Negative for heartburn, abdominal pain and constipation.  Musculoskeletal: Negative for myalgias and joint pain.  Skin: Negative.   Psychiatric/Behavioral: The patient is not nervous/anxious.     Physical Exam  Constitutional: No distress.  Obese   Eyes: Conjunctivae are normal.  Neck: Neck supple. No JVD present. No thyromegaly present.  Cardiovascular: Normal rate, regular rhythm and intact distal pulses.   Respiratory: Effort normal and breath sounds normal. No respiratory distress. She has no wheezes.  GI: Soft. Bowel sounds are normal. She  exhibits no distension. There is no tenderness.  Musculoskeletal: She exhibits no edema.  Able to move all extremities   Lymphadenopathy:    She has no cervical adenopathy.  Neurological: She is alert.  Skin: Skin is warm and dry. She is not diaphoretic.  Psychiatric: She has a normal mood and affect.       ASSESSMENT/ PLAN:  1. Seizures: no reports of seizure activity present; will continue keppra 500 mg twice daily   2. COPD: is presently stable will continue duoneb every 6 hours as needed  3. Depression: will continue paxil 20 mg daily and takes melatonin 9 mg nightly for sleep   4. GERD; will continue protonix 40 mg daily   5. diastolic heart failure: will continue lasix 20 mg daily   6. Hypertension: will continue lisinopril/hct 20/25 mg 1/2 tab twice daily   7. Constipation: will continue senna s twice daily   8.  CAD status post MI no complaints of chest pain; will continue asa 81 mg daily   9. Metastatic adenocarcinoma to brain: has completed 4 rounds of chemo; has decided upon no further treatment. Has roxanol 5 mg every 4 hours as needed;  Has compazine 10 mg every 6 hours as needed for N/V and has zofran 8 mg every 8 hours as needed for N/V. Is being followed by hospice care.         Ok Edwards NP [facility]  Contact [phone] Monday through Friday 8am- 5pm  After hours call [phone]

## 2016-10-27 IMAGING — CT NM PET TUM IMG INITIAL (PI) SKULL BASE T - THIGH
8 series · 25 of 25 positions shown · non-contrast
Comparison: CT abdomen pelvis 10/25/2014 and CT chest 10/23/2014.

CLINICAL DATA: Initial treatment strategy for lung cancer.

EXAM:
NUCLEAR MEDICINE PET SKULL BASE TO THIGH
TECHNIQUE: 14.0 mCi F-18 FDG was injected intravenously. Full-ring PET imaging
was performed from the skull base to thigh after the radiotracer. CT
data was obtained and used for attenuation correction and anatomic
localization.
FASTING BLOOD GLUCOSE:  Value: Ninety mg/dl

[Series 3: pet sk_thigh ac · axial · 5.0mm · 4.07mm/px · z∈[-1028,-48]mm · 5 of 246 slices shown]
[im 1/246]
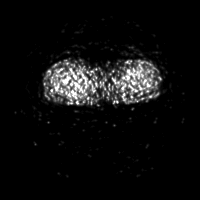
[im 62/246]
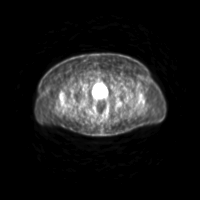
[im 123/246]
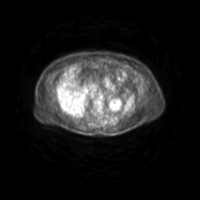
[im 184/246]
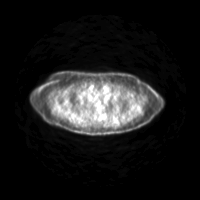
[im 246/246]
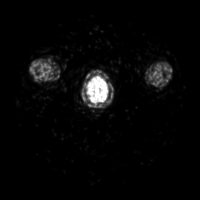

[Series 4: ct sk_thigh 5.0 hd_fov · axial · 5.0mm · 1.17mm/px · z∈[-1028,-48]mm · 5 of 244 slices shown]
[im 1/244]
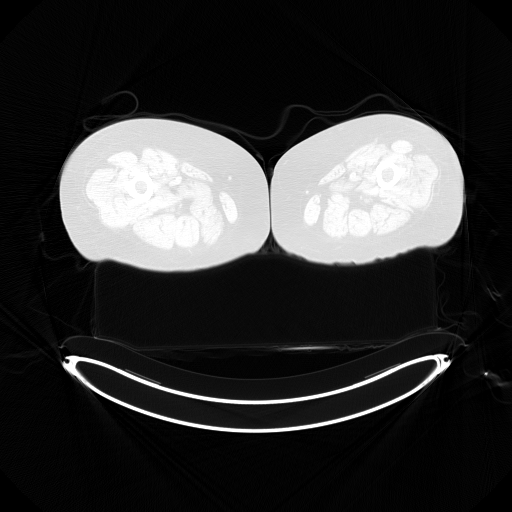
[im 61/244]
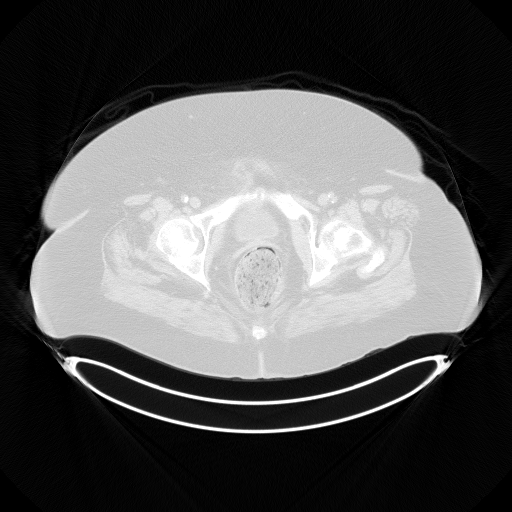
[im 122/244]
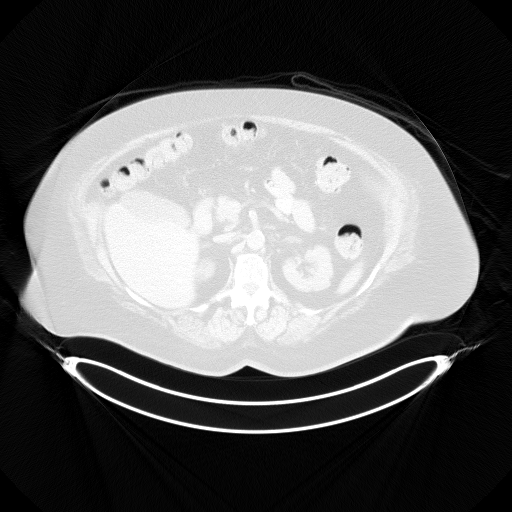
[im 183/244]
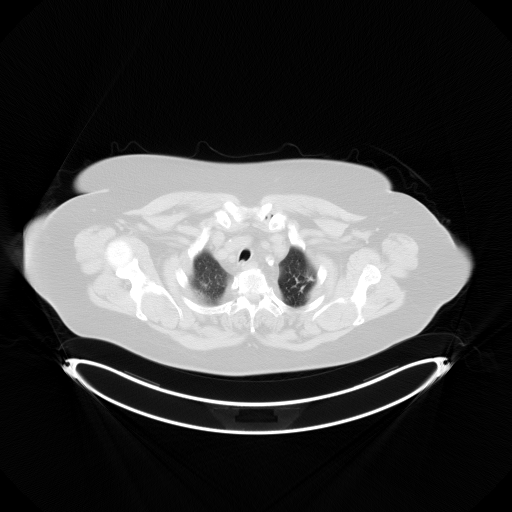
[im 244/244]
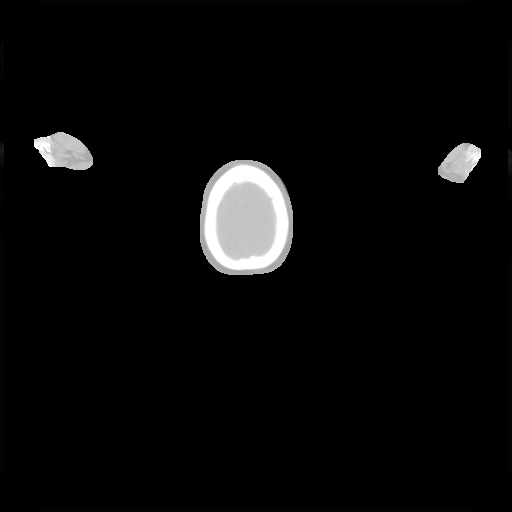

[Series 6: ct sk_thigh 5.0 b70f (id)_bone · axial · 5.0mm · 0.59mm/px · 1 of 51 slices shown]
[im 1/51  bone]
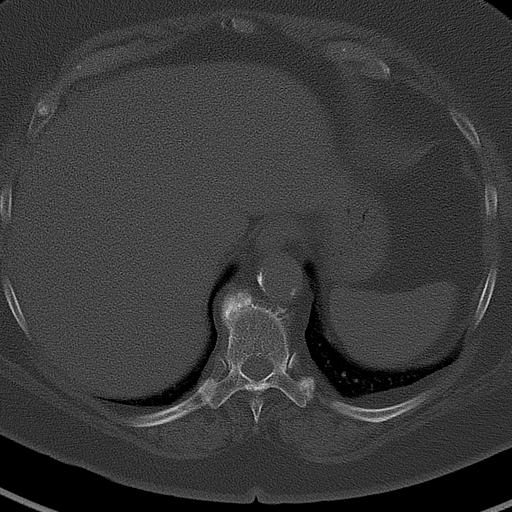

[Series 8: pet sk_thigh nac · axial · 5.0mm · 4.07mm/px · z∈[-1028,-48]mm · 5 of 246 slices shown]
[im 1/246]
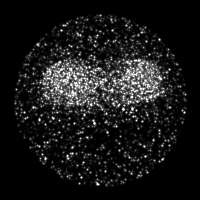
[im 62/246]
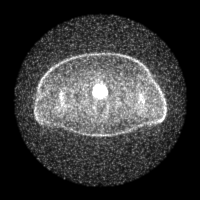
[im 123/246]
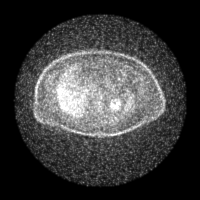
[im 184/246]
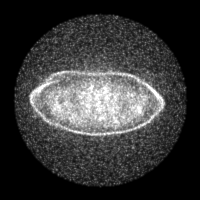
[im 246/246]
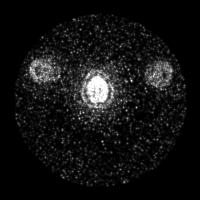

[Series 604: mip collection<mip range> · coronal · 2.03mm/px · 1 of 32 slices shown]
[im 1/32]
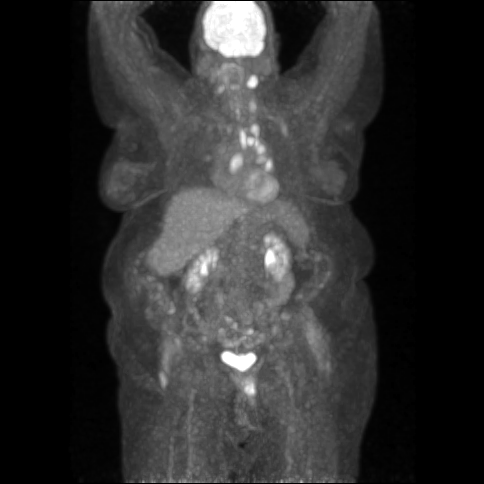

[Series 606: range-ct sk_thigh 5.0 hd_fov-cor-<alpha range> · 2 of 104 slices shown]
[im 1/104]
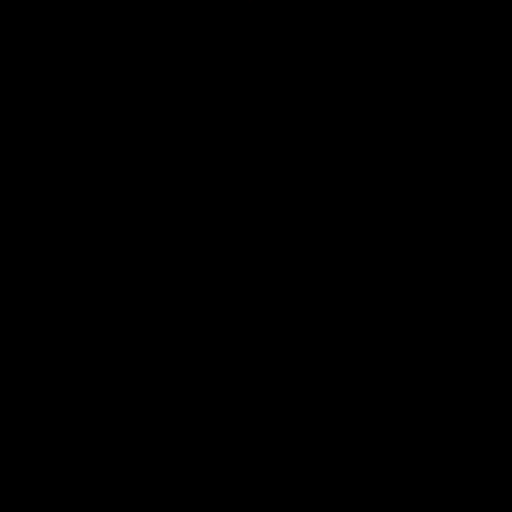
[im 104/104]
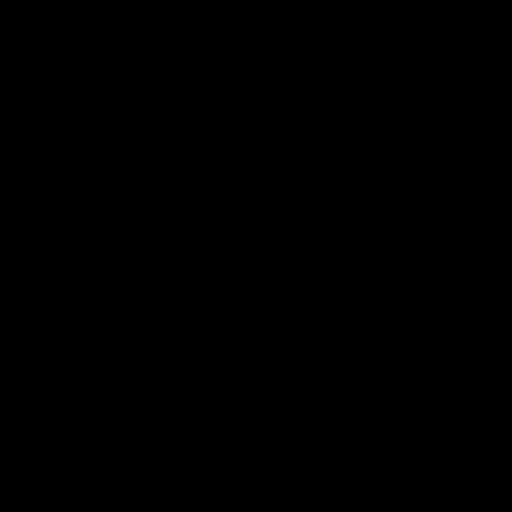

[Series 607: range-ct sk_thigh 5.0 hd_fov-tra-<alpha range> · 5 of 232 slices shown]
[im 1/232]
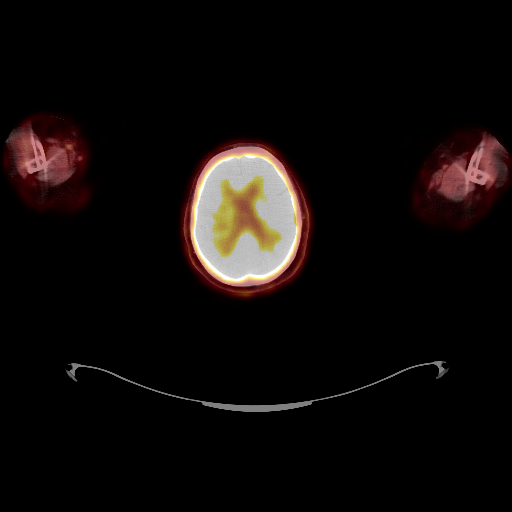
[im 58/232]
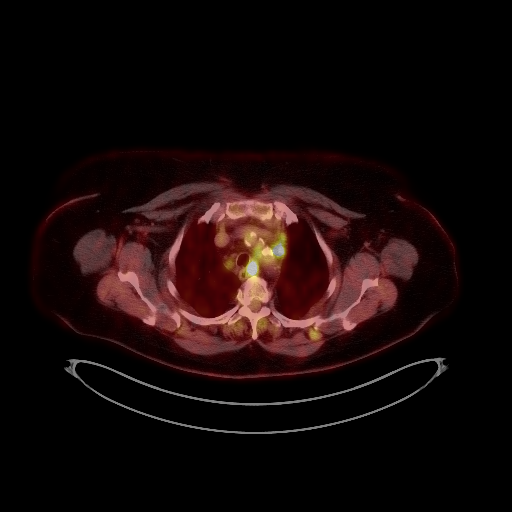
[im 116/232]
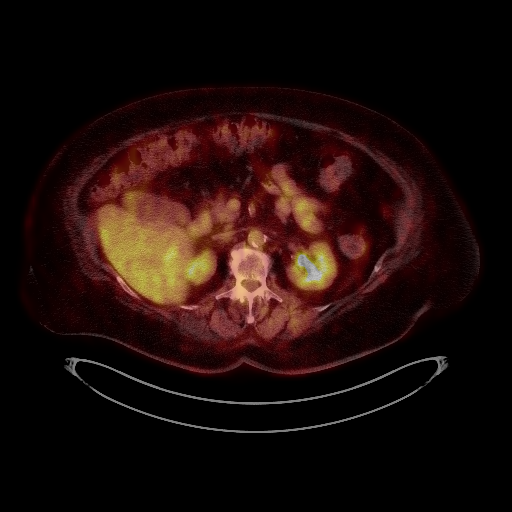
[im 174/232]
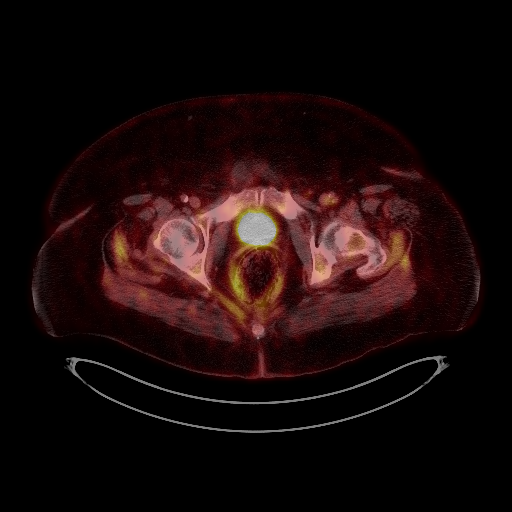
[im 232/232]
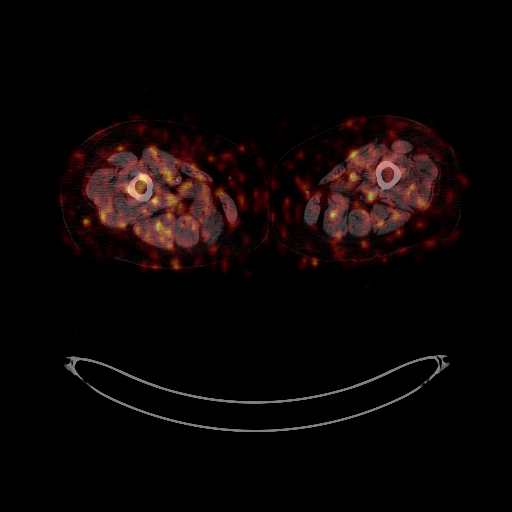

[Series 1032: results mm oncology reading · 1.07mm/px · 1 of 9 slices shown]
[im 1/9]
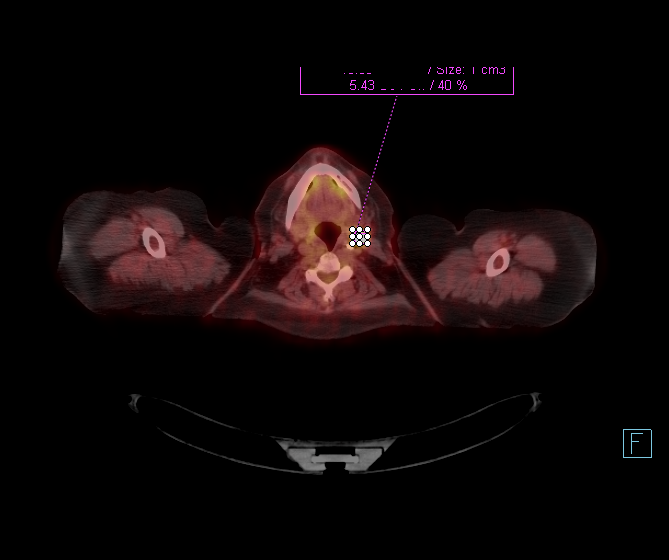

[25 of 25 positions shown; findings below may reference images not displayed]

FINDINGS: NECK

There is a hypermetabolic 1.7 cm left level 2 lymph node, with an
SUV max of 13.6. No additional hypermetabolic lymph nodes in the
neck. CT images show scattered areas of predominantly white matter
edema in the frontal and parietal lobes bilaterally.

CHEST

Hypermetabolic mediastinal and left hilar lymph nodes are seen.
Index subcarinal lymph node measures 2.3 cm with an SUV max of 10.5.
An 11 mm nodule in the medial left lower lobe has an SUV max of 3.1.
CT images show no acute findings. Three-vessel coronary artery
calcification. Heart is enlarged. No pericardial or pleural
effusion.

ABDOMEN/PELVIS

No abnormal hypermetabolism in the liver, adrenal glands, spleen or
pancreas. There is focal hypermetabolism associated with suspected
eccentric rectal wall thickening, measuring approximately 2.4 x
cm (CT image 195), with an SUV max of 13.1. No adjacent adenopathy.
No hypermetabolic lymph nodes. CT images show the liver,
gallbladder, adrenal glands, kidneys, spleen, pancreas, stomach and
bowel is otherwise grossly unremarkable. No free fluid.

SKELETON

No abnormal osseous hypermetabolism. Degenerative changes are seen
in the spine.
IMPRESSION: 1. Hypermetabolic mediastinal, left hilar and left neck adenopathy
with a mildly hypermetabolic left lower lobe nodule. Together with
known brain metastases, small cell lung carcinoma is favored.
2. Possible rectal mass with associated hypermetabolism.
3. Three-vessel coronary artery calcification.
